# Patient Record
Sex: Male | Born: 1965 | Race: Black or African American | Hispanic: No | Marital: Single | Smoking: Current every day smoker
Health system: Southern US, Community
[De-identification: ages and names within clinical notes are randomized; demographics above are authoritative.]

## PROBLEM LIST (undated history)

## (undated) DIAGNOSIS — F329 Major depressive disorder, single episode, unspecified: Secondary | ICD-10-CM

## (undated) DIAGNOSIS — R45851 Suicidal ideations: Secondary | ICD-10-CM

## (undated) DIAGNOSIS — C189 Malignant neoplasm of colon, unspecified: Secondary | ICD-10-CM

## (undated) DIAGNOSIS — K9400 Colostomy complication, unspecified: Secondary | ICD-10-CM

## (undated) DIAGNOSIS — F32A Depression, unspecified: Secondary | ICD-10-CM

---

## 2013-09-14 DIAGNOSIS — C189 Malignant neoplasm of colon, unspecified: Secondary | ICD-10-CM

## 2013-09-14 HISTORY — DX: Malignant neoplasm of colon, unspecified: C18.9

## 2014-06-14 HISTORY — PX: PARTIAL COLECTOMY: SHX5273

## 2015-04-10 ENCOUNTER — Emergency Department (HOSPITAL_COMMUNITY)
Admission: EM | Admit: 2015-04-10 | Discharge: 2015-04-10 | Disposition: A | Discharge status: Home / Self Care | Payer: Medicaid Other | Attending: Emergency Medicine | Admitting: Emergency Medicine

## 2015-04-10 ENCOUNTER — Encounter (HOSPITAL_COMMUNITY): Payer: Self-pay | Admitting: Family Medicine

## 2015-04-10 ENCOUNTER — Emergency Department (HOSPITAL_COMMUNITY): Payer: Medicaid Other

## 2015-04-10 DIAGNOSIS — I471 Supraventricular tachycardia: Secondary | ICD-10-CM | POA: Insufficient documentation

## 2015-04-10 DIAGNOSIS — C189 Malignant neoplasm of colon, unspecified: Secondary | ICD-10-CM

## 2015-04-10 DIAGNOSIS — R079 Chest pain, unspecified: Secondary | ICD-10-CM | POA: Diagnosis present

## 2015-04-10 DIAGNOSIS — Z720 Tobacco use: Secondary | ICD-10-CM

## 2015-04-10 HISTORY — DX: Malignant neoplasm of colon, unspecified: C18.9

## 2015-04-10 LAB — CBC WITH DIFFERENTIAL/PLATELET
BASOS PCT: 0 % (ref 0–1)
Basophils Absolute: 0 10*3/uL (ref 0.0–0.1)
Eosinophils Absolute: 0.1 10*3/uL (ref 0.0–0.7)
Eosinophils Relative: 1 % (ref 0–5)
HEMATOCRIT: 37.1 % — AB (ref 39.0–52.0)
HEMOGLOBIN: 12.6 g/dL — AB (ref 13.0–17.0)
LYMPHS PCT: 11 % — AB (ref 12–46)
Lymphs Abs: 0.5 10*3/uL — ABNORMAL LOW (ref 0.7–4.0)
MCH: 29.9 pg (ref 26.0–34.0)
MCHC: 34 g/dL (ref 30.0–36.0)
MCV: 87.9 fL (ref 78.0–100.0)
MONO ABS: 0.4 10*3/uL (ref 0.1–1.0)
Monocytes Relative: 8 % (ref 3–12)
Neutro Abs: 3.8 10*3/uL (ref 1.7–7.7)
Neutrophils Relative %: 80 % — ABNORMAL HIGH (ref 43–77)
PLATELETS: 259 10*3/uL (ref 150–400)
RBC: 4.22 MIL/uL (ref 4.22–5.81)
RDW: 15.1 % (ref 11.5–15.5)
WBC: 4.8 10*3/uL (ref 4.0–10.5)

## 2015-04-10 LAB — BASIC METABOLIC PANEL
ANION GAP: 5 (ref 5–15)
BUN: 21 mg/dL — ABNORMAL HIGH (ref 6–20)
CHLORIDE: 112 mmol/L — AB (ref 101–111)
CO2: 23 mmol/L (ref 22–32)
Calcium: 8.7 mg/dL — ABNORMAL LOW (ref 8.9–10.3)
Creatinine, Ser: 1.31 mg/dL — ABNORMAL HIGH (ref 0.61–1.24)
GLUCOSE: 115 mg/dL — AB (ref 65–99)
Potassium: 4 mmol/L (ref 3.5–5.1)
SODIUM: 140 mmol/L (ref 135–145)

## 2015-04-10 LAB — I-STAT CHEM 8, ED
BUN: 23 mg/dL — AB (ref 6–20)
CREATININE: 2 mg/dL — AB (ref 0.61–1.24)
Calcium, Ion: 1.21 mmol/L (ref 1.12–1.23)
Chloride: 110 mmol/L (ref 101–111)
GLUCOSE: 175 mg/dL — AB (ref 65–99)
HCT: 42 % (ref 39.0–52.0)
Hemoglobin: 14.3 g/dL (ref 13.0–17.0)
Potassium: 3.6 mmol/L (ref 3.5–5.1)
Sodium: 145 mmol/L (ref 135–145)
TCO2: 18 mmol/L (ref 0–100)

## 2015-04-10 LAB — I-STAT TROPONIN, ED
TROPONIN I, POC: 0.12 ng/mL — AB (ref 0.00–0.08)
Troponin i, poc: 0.01 ng/mL (ref 0.00–0.08)

## 2015-04-10 LAB — TSH: TSH: 1.492 u[IU]/mL (ref 0.350–4.500)

## 2015-04-10 MED ORDER — ADENOSINE 6 MG/2ML IV SOLN
INTRAVENOUS | Status: AC
Start: 2015-04-10 — End: 2015-04-10
  Administered 2015-04-10: 6 mg
  Filled 2015-04-10: qty 6

## 2015-04-10 MED ORDER — SODIUM CHLORIDE 0.9 % IV BOLUS (SEPSIS)
1000.0000 mL | Freq: Once | INTRAVENOUS | Status: AC
  Administered 2015-04-10: 1000 mL via INTRAVENOUS

## 2015-04-10 MED ORDER — FENTANYL CITRATE (PF) 100 MCG/2ML IJ SOLN
INTRAMUSCULAR | Status: AC
  Administered 2015-04-10: 50 ug
  Filled 2015-04-10: qty 2

## 2015-04-10 MED ORDER — DILTIAZEM HCL 25 MG/5ML IV SOLN
20.0000 mg | Freq: Once | INTRAVENOUS | Status: AC
  Administered 2015-04-10: 20 mg via INTRAVENOUS

## 2015-04-10 MED ORDER — PROPRANOLOL HCL 10 MG PO TABS: ORAL_TABLET | ORAL | Status: DC

## 2015-04-10 MED ORDER — PROPOFOL 10 MG/ML IV BOLUS
INTRAVENOUS | Status: AC
  Filled 2015-04-10: qty 20

## 2015-04-10 NOTE — Discharge Instructions (Signed)
Take prescribed medication as directed. Follow up with the recommended Cardiologist for further evaluation. Return to the ED with worsening or concerning symptoms.

## 2015-04-10 NOTE — ED Notes (Signed)
Pt entered room 7 @ 9:30 am with HR @ 225, pt was extremely diaphoretic and reporting chest pain, appeared very short of breath; pt is alert and oriented, speaking in clear sentences, VSS otherwise stable beside hr. Placed on zoll monitor with pads. MD Reather Converse was at bedside attempting vagal maneuvers which weren't responding so then ordered 6 mg of adenosine; NS Bolus was hanging with IV access in LFA, 20 G. 6 mg of adenosine pushed followed with 20 cc NS push, pt HR did not respond. Zavtiz ordered 12 mg adenosine to follow, again pushed and followed with 20 cc NS push, pt again did not respond. At that time, MD Reather Converse ordered a 20 mg bolus of cardizem STAT and 50 mcg of fentanyl. Both were given. Pt spontaneously converted at that time to NSR @ 80 bpm with Zavitz MD remaining at bedside. Follow up EKG captured. Pt reports relief of chest pain and no longer diaphoretic and sob, VSS - 100/69, HR 87 NSR, SpO2 99%. Per MD Reather Converse do not give cardizem infusion since pt has converted to normal rhythm.
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
## 2015-04-10 NOTE — ED Provider Notes (Signed)
Assumed care from [facility] at shift change.  Briefly, 49 y.o. M here with episode of SVT.  He was given adenosine 6mg  followed by 12mg  without conversion.  He eventually did convert to NSR with IVF and diltiazem.  Troponin did bump to 0.12.  Plan:  Cardiology to evaluate patient and determine disposition.  Results for orders placed or performed during the hospital encounter of 04/10/15  TSH  Result Value Ref Range   TSH 1.492 0.350 - 4.500 uIU/mL  CBC with Differential/Platelet  Result Value Ref Range   WBC 4.8 4.0 - 10.5 K/uL   RBC 4.22 4.22 - 5.81 MIL/uL   Hemoglobin 12.6 (L) 13.0 - 17.0 g/dL   HCT 37.1 (L) 39.0 - 52.0 %   MCV 87.9 78.0 - 100.0 fL   MCH 29.9 26.0 - 34.0 pg   MCHC 34.0 30.0 - 36.0 g/dL   RDW 15.1 11.5 - 15.5 %   Platelets 259 150 - 400 K/uL   Neutrophils Relative % 80 (H) 43 - 77 %   Neutro Abs 3.8 1.7 - 7.7 K/uL   Lymphocytes Relative 11 (L) 12 - 46 %   Lymphs Abs 0.5 (L) 0.7 - 4.0 K/uL   Monocytes Relative 8 3 - 12 %   Monocytes Absolute 0.4 0.1 - 1.0 K/uL   Eosinophils Relative 1 0 - 5 %   Eosinophils Absolute 0.1 0.0 - 0.7 K/uL   Basophils Relative 0 0 - 1 %   Basophils Absolute 0.0 0.0 - 0.1 K/uL  Basic metabolic panel  Result Value Ref Range   Sodium 140 135 - 145 mmol/L   Potassium 4.0 3.5 - 5.1 mmol/L   Chloride 112 (H) 101 - 111 mmol/L   CO2 23 22 - 32 mmol/L   Glucose, Bld 115 (H) 65 - 99 mg/dL   BUN 21 (H) 6 - 20 mg/dL   Creatinine, Ser 1.31 (H) 0.61 - 1.24 mg/dL   Calcium 8.7 (L) 8.9 - 10.3 mg/dL   GFR calc non Af Amer >60 >60 mL/min   GFR calc Af Amer >60 >60 mL/min   Anion gap 5 5 - 15  I-stat troponin, ED  Result Value Ref Range   Troponin i, poc 0.01 0.00 - 0.08 ng/mL   Comment 3          I-stat chem 8, ed  Result Value Ref Range   Sodium 145 135 - 145 mmol/L   Potassium 3.6 3.5 - 5.1 mmol/L   Chloride 110 101 - 111 mmol/L   BUN 23 (H) 6 - 20 mg/dL   Creatinine, Ser 2.00 (H) 0.61 - 1.24 mg/dL   Glucose, Bld 175 (H) 65 - 99  mg/dL   Calcium, Ion 1.21 1.12 - 1.23 mmol/L   TCO2 18 0 - 100 mmol/L   Hemoglobin 14.3 13.0 - 17.0 g/dL   HCT 42.0 39.0 - 52.0 %  I-stat troponin, ED  Result Value Ref Range   Troponin i, poc 0.12 (HH) 0.00 - 0.08 ng/mL   Comment 3           Dg Chest Portable 1 View  04/10/2015   CLINICAL DATA:  Shortness of breath and chest pain  EXAM: PORTABLE CHEST - 1 VIEW  COMPARISON:  None.  FINDINGS: Cardiac shadow is mildly enlarged. A left chest wall port is noted in the proximal superior vena cava. The lungs are free of acute infiltrate or sizable effusion. No bony abnormality is noted.  IMPRESSION: No acute abnormality seen.  Electronically Signed   By: Inez Catalina M.D.   On: 04/10/2015 10:16    Patient has been evaluated by cardiology. He remains in sinus rhythm. He does have a few noted PVCs and PACs, asymptomatic of this. Dr. Acie Fredrickson feels patient is stable for discharge with outpatient follow-up. He is to be started on propranolol 10mg  Q10 mins x 3 doses for recurrent symptoms.  He will FU in office.  Discussed plan with patient, he/she acknowledged understanding and agreed with plan of care.  Return precautions given for new or worsening symptoms.  Larene Pickett, PA-C 04/10/15 2001  Malvin Johns, MD 04/10/15 2116

## 2015-04-10 NOTE — ED Notes (Signed)
Pt here for chest pain that started 20 minutes ago. Pt diaphoretic and SOB.

## 2015-04-10 NOTE — ED Notes (Signed)
Pt brought back to room via wheelchair; pt undressed, placed on monitor, continuous pulse oximetry, blood pressure cuff and zoll pads placed; Reather Converse, MD and [place], [place] met myself and Tressia Miners, RN in room along with [place], EMT

## 2015-04-10 NOTE — ED Provider Notes (Signed)
CSN: [id number]     Arrival date & time 04/10/15  0906 History   First MD Initiated Contact with Patient 04/10/15 0915     Chief Complaint  Patient presents with  . Chest Pain     (Consider location/radiation/quality/duration/timing/severity/associated sxs/prior Treatment) Patient is a 49 y.o. male presenting with chest pain. The history is provided by the patient.  Chest Pain Pain location:  L chest Pain quality: aching   Pain radiates to:  Does not radiate Pain radiates to the back: no   Pain severity:  Severe Onset quality:  Sudden Duration:  20 minutes Timing:  Constant Progression:  Unchanged Chronicity:  New Context: at rest   Context: not breathing, no drug use, not eating, no intercourse, not lifting, no movement, not raising an arm, no stress and no trauma   Relieved by:  Nothing Worsened by:  Nothing tried Ineffective treatments:  None tried Associated symptoms: diaphoresis and shortness of breath   Risk factors: male sex and obesity   Risk factors: no aortic disease, no birth control, no coronary artery disease, no diabetes mellitus, no Ehlers-Danlos syndrome, no high cholesterol, no immobilization, not pregnant, no prior DVT/PE, no smoking and no surgery     History reviewed. No pertinent past medical history. History reviewed. No pertinent past surgical history. No family history on file. History  Substance Use Topics  . Smoking status: Not on file  . Smokeless tobacco: Not on file  . Alcohol Use: Not on file    Review of Systems  Constitutional: Positive for diaphoresis.  Respiratory: Positive for shortness of breath.   Cardiovascular: Positive for chest pain.  All other systems reviewed and are negative.     Allergies  Morphine and related  Home Medications   Prior to Admission medications   Not on File   BP 100/69 mmHg  Pulse 75  Resp 13  SpO2 99% Physical Exam  Constitutional: He is oriented to person, place, and time. He appears  well-developed and well-nourished. He appears distressed.  HENT:  Head: Normocephalic and atraumatic.  Eyes: Conjunctivae are normal.  Neck: Normal range of motion. Neck supple.  Cardiovascular: Normal rate and regular rhythm.  Exam reveals no gallop and no friction rub.   No murmur heard. Pulmonary/Chest: Breath sounds normal. He has no wheezes. He has no rales. He exhibits no tenderness.  Increased breathing effort.   Abdominal: Soft. He exhibits no distension. There is no tenderness. There is no rebound.  Ostomy of LLQ. No tenderness to palpation.   Musculoskeletal: Normal range of motion.  Neurological: He is alert and oriented to person, place, and time. Coordination normal.  Speech is goal-oriented. Moves limbs without ataxia.   Skin: Skin is warm. He is diaphoretic.  Psychiatric: He has a normal mood and affect. His behavior is normal.  Nursing note and vitals reviewed.   ED Course  Procedures (including critical care time)  CRITICAL CARE Performed by: Alvina Chou   Total critical care time: 1 hour  Critical care time was exclusive of separately billable procedures and treating other patients.  Critical care was necessary to treat or prevent imminent or life-threatening deterioration.  Critical care was time spent personally by me on the following activities: development of treatment plan with patient and/or surrogate as well as nursing, discussions with consultants, evaluation of patient's response to treatment, examination of patient, obtaining history from patient or surrogate, ordering and performing treatments and interventions, ordering and review of laboratory studies, ordering and review of radiographic  studies, pulse oximetry and re-evaluation of patient's condition.   Labs Review Labs Reviewed  CBC WITH DIFFERENTIAL/PLATELET - Abnormal; Notable for the following:    Hemoglobin 12.6 (*)    HCT 37.1 (*)    Neutrophils Relative % 80 (*)    Lymphocytes  Relative 11 (*)    Lymphs Abs 0.5 (*)    All other components within normal limits  BASIC METABOLIC PANEL - Abnormal; Notable for the following:    Chloride 112 (*)    Glucose, Bld 115 (*)    BUN 21 (*)    Creatinine, Ser 1.31 (*)    Calcium 8.7 (*)    All other components within normal limits  I-STAT CHEM 8, ED - Abnormal; Notable for the following:    BUN 23 (*)    Creatinine, Ser 2.00 (*)    Glucose, Bld 175 (*)    All other components within normal limits  I-STAT TROPOININ, ED - Abnormal; Notable for the following:    Troponin i, poc 0.12 (*)    All other components within normal limits  TSH  I-STAT TROPOININ, ED    Imaging Review Dg Chest Portable 1 View  04/10/2015   CLINICAL DATA:  Shortness of breath and chest pain  EXAM: PORTABLE CHEST - 1 VIEW  COMPARISON:  None.  FINDINGS: Cardiac shadow is mildly enlarged. A left chest wall port is noted in the proximal superior vena cava. The lungs are free of acute infiltrate or sizable effusion. No bony abnormality is noted.  IMPRESSION: No acute abnormality seen.   Electronically Signed   By: Inez Catalina M.D.   On: 04/10/2015 10:16     EKG Interpretation   Date/Time:  Wednesday April 10 2015 09:12:27 EDT Ventricular Rate:  224 PR Interval:    QRS Duration: 96 QT Interval:  214 QTC Calculation: 413 R Axis:   97 Text Interpretation:  Supraventricular tachycardia Rightward axis RSR' or  QR pattern in V1 suggests right ventricular conduction delay Marked ST  abnormality, possible inferolateral subendocardial injury Abnormal ECG  Confirmed by ZAVITZ  MD, JOSHUA ([id number]) on 04/10/2015 9:39:14 AM      MDM   Final diagnoses:  SVT (supraventricular tachycardia)    10:11 AM Patient presented diaphoretic and complaining of left side chest pain. He was also short of breath. EKG found to have SVT. Vagal maneuvers attempted without success. Patient given 6mg  adenosine without success, followed by 12mg  adenosine. Patient maintained  consciousness. Patient also subsequently started with fluids and diltiazem IV. Patient converted to NSR after these interventions. Labs pending. Vitals stable and patient afebrile.   Patient feeling better after conversion. Delta troponin shows mild elevated. Cardiology will see the patient. Patient signed out to Quincy Carnes, PA-C.    Alvina Chou, PA-C 04/11/15 2446  Elnora Morrison, MD 04/13/15 [phone]

## 2015-04-10 NOTE — ED Notes (Signed)
Cardiology at bedside.

## 2015-04-10 NOTE — ED Notes (Signed)
Lab result of I-Stat troponin 0.12 reported to [place].

## 2015-04-10 NOTE — ED Notes (Signed)
Spoke with lab, they will run CBC and BMP that was sent down at 0930 this morning.

## 2015-05-21 ENCOUNTER — Telehealth: Payer: Self-pay | Admitting: *Deleted

## 2015-05-21 NOTE — Telephone Encounter (Signed)
called for family medical hx, no answer

## 2015-05-23 ENCOUNTER — Ambulatory Visit: Payer: Medicaid Other | Admitting: Cardiovascular Disease

## 2015-05-29 ENCOUNTER — Encounter: Payer: Self-pay | Admitting: Cardiovascular Disease

## 2015-06-11 ENCOUNTER — Emergency Department (HOSPITAL_COMMUNITY)
Admission: EM | Admit: 2015-06-11 | Discharge: 2015-06-12 | Disposition: A | Discharge status: Home / Self Care | Payer: Medicaid Other | Source: Home / Self Care | Attending: Emergency Medicine | Admitting: Emergency Medicine

## 2015-06-11 ENCOUNTER — Emergency Department (HOSPITAL_COMMUNITY)
Admission: EM | Admit: 2015-06-11 | Discharge: 2015-06-11 | Disposition: A | Discharge status: Home / Self Care | Payer: Medicaid Other | Attending: Emergency Medicine | Admitting: Emergency Medicine

## 2015-06-11 ENCOUNTER — Emergency Department (HOSPITAL_COMMUNITY): Payer: Medicaid Other

## 2015-06-11 ENCOUNTER — Encounter (HOSPITAL_COMMUNITY): Payer: Self-pay | Admitting: Radiology

## 2015-06-11 ENCOUNTER — Encounter (HOSPITAL_COMMUNITY): Payer: Self-pay | Admitting: Emergency Medicine

## 2015-06-11 DIAGNOSIS — R51 Headache: Secondary | ICD-10-CM | POA: Insufficient documentation

## 2015-06-11 DIAGNOSIS — Z85038 Personal history of other malignant neoplasm of large intestine: Secondary | ICD-10-CM | POA: Diagnosis not present

## 2015-06-11 DIAGNOSIS — R519 Headache, unspecified: Secondary | ICD-10-CM

## 2015-06-11 DIAGNOSIS — R55 Syncope and collapse: Secondary | ICD-10-CM | POA: Diagnosis present

## 2015-06-11 DIAGNOSIS — R079 Chest pain, unspecified: Secondary | ICD-10-CM | POA: Insufficient documentation

## 2015-06-11 LAB — I-STAT TROPONIN, ED
TROPONIN I, POC: 0 ng/mL (ref 0.00–0.08)
TROPONIN I, POC: 0 ng/mL (ref 0.00–0.08)

## 2015-06-11 LAB — BASIC METABOLIC PANEL
ANION GAP: 8 (ref 5–15)
Anion gap: 8 (ref 5–15)
BUN: 10 mg/dL (ref 6–20)
BUN: 13 mg/dL (ref 6–20)
CALCIUM: 9 mg/dL (ref 8.9–10.3)
CO2: 28 mmol/L (ref 22–32)
CO2: 24 mmol/L (ref 22–32)
Calcium: 9.5 mg/dL (ref 8.9–10.3)
Chloride: 102 mmol/L (ref 101–111)
Chloride: 104 mmol/L (ref 101–111)
Creatinine, Ser: 1.47 mg/dL — ABNORMAL HIGH (ref 0.61–1.24)
Creatinine, Ser: 1.4 mg/dL — ABNORMAL HIGH (ref 0.61–1.24)
GFR calc Af Amer: >60 mL/min (ref >60)
GFR calc Af Amer: >60 mL/min (ref >60)
GFR calc non Af Amer: 55 mL/min — ABNORMAL LOW (ref >60)
GFR, EST NON AFRICAN AMERICAN: 58 mL/min — AB (ref >60)
GLUCOSE: 130 mg/dL — AB (ref 65–99)
Glucose, Bld: 81 mg/dL (ref 65–99)
Potassium: 3.9 mmol/L (ref 3.5–5.1)
Potassium: 3.6 mmol/L (ref 3.5–5.1)
Sodium: 138 mmol/L (ref 135–145)
Sodium: 136 mmol/L (ref 135–145)

## 2015-06-11 LAB — CBC
HCT: 37.2 % — ABNORMAL LOW (ref 39.0–52.0)
HEMOGLOBIN: 12.5 g/dL — AB (ref 13.0–17.0)
MCH: 29.5 pg (ref 26.0–34.0)
MCHC: 33.6 g/dL (ref 30.0–36.0)
MCV: 87.7 fL (ref 78.0–100.0)
Platelets: 269 10*3/uL (ref 150–400)
RBC: 4.24 MIL/uL (ref 4.22–5.81)
RDW: 15.1 % (ref 11.5–15.5)
WBC: 9.8 10*3/uL (ref 4.0–10.5)

## 2015-06-11 LAB — CBC WITH DIFFERENTIAL/PLATELET
Basophils Absolute: 0 10*3/uL (ref 0.0–0.1)
Basophils Relative: 0 %
Eosinophils Absolute: 0.1 10*3/uL (ref 0.0–0.7)
Eosinophils Relative: 1 %
HCT: 39.7 % (ref 39.0–52.0)
Hemoglobin: 13.1 g/dL (ref 13.0–17.0)
Lymphocytes Relative: 8 %
Lymphs Abs: 1 10*3/uL (ref 0.7–4.0)
MCH: 29.4 pg (ref 26.0–34.0)
MCHC: 33 g/dL (ref 30.0–36.0)
MCV: 89.2 fL (ref 78.0–100.0)
Monocytes Absolute: 0.5 10*3/uL (ref 0.1–1.0)
Monocytes Relative: 4 %
Neutro Abs: 11.3 10*3/uL — ABNORMAL HIGH (ref 1.7–7.7)
Neutrophils Relative %: 87 %
Platelets: 275 10*3/uL (ref 150–400)
RBC: 4.45 MIL/uL (ref 4.22–5.81)
RDW: 15.4 % (ref 11.5–15.5)
WBC: 12.9 10*3/uL — ABNORMAL HIGH (ref 4.0–10.5)

## 2015-06-11 LAB — I-STAT CHEM 8, ED
BUN: 14 mg/dL (ref 6–20)
CALCIUM ION: 1.19 mmol/L (ref 1.12–1.23)
Chloride: 105 mmol/L (ref 101–111)
Creatinine, Ser: 1.3 mg/dL — ABNORMAL HIGH (ref 0.61–1.24)
GLUCOSE: 127 mg/dL — AB (ref 65–99)
HCT: 41 % (ref 39.0–52.0)
HEMOGLOBIN: 13.9 g/dL (ref 13.0–17.0)
Potassium: 3.6 mmol/L (ref 3.5–5.1)
SODIUM: 141 mmol/L (ref 135–145)
TCO2: 22 mmol/L (ref 0–100)

## 2015-06-11 LAB — URINALYSIS, ROUTINE W REFLEX MICROSCOPIC
Bilirubin Urine: NEGATIVE
Glucose, UA: NEGATIVE mg/dL
Hgb urine dipstick: NEGATIVE
Ketones, ur: NEGATIVE mg/dL
Leukocytes, UA: NEGATIVE
Nitrite: NEGATIVE
Protein, ur: NEGATIVE mg/dL
Specific Gravity, Urine: 1.014 (ref 1.005–1.030)
Urobilinogen, UA: 0.2 mg/dL (ref 0.0–1.0)
pH: 6 (ref 5.0–8.0)

## 2015-06-11 MED ORDER — DIPHENHYDRAMINE HCL 50 MG/ML IJ SOLN
25.0000 mg | Freq: Once | INTRAMUSCULAR | Status: DC
  Filled 2015-06-11: qty 1

## 2015-06-11 MED ORDER — NITROGLYCERIN 0.4 MG SL SUBL: 0.4000 mg | SUBLINGUAL_TABLET | SUBLINGUAL | Status: DC | PRN

## 2015-06-11 MED ORDER — BUTALBITAL-APAP-CAFFEINE 50-325-40 MG PO TABS: 1.0000 | ORAL_TABLET | Freq: Four times a day (QID) | ORAL | Status: DC | PRN

## 2015-06-11 MED ORDER — DEXAMETHASONE SODIUM PHOSPHATE 10 MG/ML IJ SOLN
10.0000 mg | Freq: Once | INTRAMUSCULAR | Status: AC
  Administered 2015-06-11: 10 mg via INTRAVENOUS
  Filled 2015-06-11: qty 1

## 2015-06-11 MED ORDER — PROCHLORPERAZINE EDISYLATE 5 MG/ML IJ SOLN
10.0000 mg | Freq: Once | INTRAMUSCULAR | Status: AC
  Administered 2015-06-11: 10 mg via INTRAVENOUS
  Filled 2015-06-11: qty 2

## 2015-06-11 MED ORDER — KETOROLAC TROMETHAMINE 30 MG/ML IJ SOLN
15.0000 mg | Freq: Once | INTRAMUSCULAR | Status: AC
  Administered 2015-06-11: 15 mg via INTRAVENOUS
  Filled 2015-06-11: qty 1

## 2015-06-11 MED ORDER — IOHEXOL 350 MG/ML SOLN
100.0000 mL | Freq: Once | INTRAVENOUS | Status: AC | PRN
  Administered 2015-06-11: 100 mL via INTRAVENOUS

## 2015-06-11 MED ORDER — ASPIRIN 81 MG PO CHEW: 324.0000 mg | CHEWABLE_TABLET | Freq: Once | ORAL | Status: DC

## 2015-06-11 MED ORDER — SODIUM CHLORIDE 0.9 % IV BOLUS (SEPSIS)
1000.0000 mL | Freq: Once | INTRAVENOUS | Status: AC
  Administered 2015-06-11: 1000 mL via INTRAVENOUS

## 2015-06-11 MED ORDER — DIPHENHYDRAMINE HCL 50 MG/ML IJ SOLN
25.0000 mg | Freq: Once | INTRAMUSCULAR | Status: AC
  Administered 2015-06-11: 25 mg via INTRAVENOUS
  Filled 2015-06-11: qty 1

## 2015-06-11 MED ORDER — KETOROLAC TROMETHAMINE 30 MG/ML IJ SOLN: 30.0000 mg | Freq: Once | INTRAMUSCULAR | Status: DC

## 2015-06-11 NOTE — ED Notes (Signed)
Pt's O2 level continues to drop below 92% for short intervals as he sleeps,  Placed 2 L O2 [place] on pt.

## 2015-06-11 NOTE — Discharge Instructions (Signed)
Return here as needed.  Follow-up with the clinic provided [place] stopped by the clinic to set up an appointment

## 2015-06-11 NOTE — ED Notes (Signed)
Per [facility] pt was discharged from ED around 5pm after being treated for a headache.  While he was waiting at the bus stop he began to have "pass out episodes" and then woke up w/ chest pain.  He was siting and did not fall or hit his head.  Does have a history of cancer and a colostomy.  Reports left sided chest pain that radiates down his left arm.  No N/V/D.  Was given 324 ASP in route but no nitro due to headache.
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
## 2015-06-11 NOTE — Discharge Instructions (Signed)
Syncope °Syncope is a medical term for fainting or passing out. This means you lose consciousness and drop to the ground. People are generally unconscious for less than 5 minutes. You may have some muscle twitches for up to 15 seconds before waking up and returning to normal. Syncope occurs more often in older adults, but it can happen to anyone. While most causes of syncope are not dangerous, syncope can be a sign of a serious medical problem. It is important to seek medical care.  °CAUSES  °Syncope is caused by a sudden drop in blood flow to the brain. The specific cause is often not determined. Factors that can bring on syncope include: °· Taking medicines that lower blood pressure. °· Sudden changes in posture, such as standing up quickly. °· Taking more medicine than prescribed. °· Standing in one place for too long. °· Seizure disorders. °· Dehydration and excessive exposure to heat. °· Low blood sugar (hypoglycemia). °· Straining to have a bowel movement. °· Heart disease, irregular heartbeat, or other circulatory problems. °· Fear, emotional distress, seeing blood, or severe pain. °SYMPTOMS  °Right before fainting, you may: °· Feel dizzy or light-headed. °· Feel nauseous. °· See all white or all black in your field of vision. °· Have cold, clammy skin. °DIAGNOSIS  °Your health care provider will ask about your symptoms, perform a physical exam, and perform an electrocardiogram (ECG) to record the electrical activity of your heart. Your health care provider may also perform other heart or blood tests to determine the cause of your syncope which may include: °· Transthoracic echocardiogram (TTE). During echocardiography, sound waves are used to evaluate how blood flows through your heart. °· Transesophageal echocardiogram (TEE). °· Cardiac monitoring. This allows your health care provider to monitor your heart rate and rhythm in real time. °· Holter monitor. This is a portable device that records your  heartbeat and can help diagnose heart arrhythmias. It allows your health care provider to track your heart activity for several days, if needed. °· Stress tests by exercise or by giving medicine that makes the heart beat faster. °TREATMENT  °In most cases, no treatment is needed. Depending on the cause of your syncope, your health care provider may recommend changing or stopping some of your medicines. °HOME CARE INSTRUCTIONS °· Have someone stay with you until you feel stable. °· Do not drive, use machinery, or play sports until your health care provider says it is okay. °· Keep all follow-up appointments as directed by your health care provider. °· Lie down right away if you start feeling like you might faint. Breathe deeply and steadily. Wait until all the symptoms have passed. °· Drink enough fluids to keep your urine clear or pale yellow. °· If you are taking blood pressure or heart medicine, get up slowly and take several minutes to sit and then stand. This can reduce dizziness. °SEEK IMMEDIATE MEDICAL CARE IF:  °· You have a severe headache. °· You have unusual pain in the chest, abdomen, or back. °· You are bleeding from your mouth or rectum, or you have black or tarry stool. °· You have an irregular or very fast heartbeat. °· You have pain with breathing. °· You have repeated fainting or seizure-like jerking during an episode. °· You faint when sitting or lying down. °· You have confusion. °· You have trouble walking. °· You have severe weakness. °· You have vision problems. °If you fainted, call your local emergency services (911 in [place] Do not drive   yourself to the hospital.  °MAKE SURE YOU: °· Understand these instructions. °· Will watch your condition. °· Will get help right away if you are not doing well or get worse. °Document Released: 08/31/2005 Document Revised: 09/05/2013 Document Reviewed: 10/30/2011 °[employer]® Patient Information ©2015 [employer]. This information is not intended to replace  advice given to you by your health care provider. Make sure you discuss any questions you have with your health care provider. ° °

## 2015-06-11 NOTE — ED Notes (Signed)
Patient transported to X-ray 

## 2015-06-11 NOTE — ED Notes (Signed)
Pt sts generalized HA x 2 days with some blotches in vision per pt

## 2015-06-11 NOTE — ED Provider Notes (Signed)
CSN: [id number]     Arrival date & time 06/11/15  1204 History   First MD Initiated Contact with Patient 06/11/15 1332     Chief Complaint  Patient presents with  . Headache     (Consider location/radiation/quality/duration/timing/severity/associated sxs/prior Treatment) HPI Patient presents to the emergency department with headache that started 2 days ago.  The patient states he has generalized headache is also had some blurred vision and light sensitivity.  Patient states that he has had episodes similar to this in the past.  He is also had episodes where he has had near syncope or syncope from the headache.  Patient states that he had recent colon cancer surgery last year.  Patient states he is not having chest pain, shortness breath, nausea, vomiting, weakness, dizziness, neck pain, neck pain, fever, cough, dysuria, incontinence, hematemesis, bloody stool, abdominal pain, or rash.  The patient states that he took Tylenol PM last night Past Medical History  Diagnosis Date  . Colon cancer   . Colon cancer 2015   Past Surgical History  Procedure Laterality Date  . Partial colectomy  06/2014    done at [facility]   Family History  Problem Relation Age of Onset  . Coronary artery disease Cousin 9   Social History  Substance Use Topics  . Smoking status: None  . Smokeless tobacco: None  . Alcohol Use: None    Review of Systems  All other systems negative except as documented in the HPI. All pertinent positives and negatives as reviewed in the HPI.  Allergies  Morphine and related  Home Medications   Prior to Admission medications   Medication Sig Start Date End Date Taking? Authorizing Provider  ibuprofen (ADVIL,MOTRIN) 200 MG tablet Take 200 mg by mouth every 6 (six) hours as needed for fever.    Historical Provider, MD  propranolol (INDERAL) 10 MG tablet Take 1 tablet Q10 mins x3 doses for palpitations/SVT 04/10/15   Larene Pickett, PA-C   BP 129/90 mmHg  Pulse 73   Temp(Src) 98.3 F (36.8 C) (Oral)  Resp 19  SpO2 97% Physical Exam  Constitutional: He is oriented to person, place, and time. He appears well-developed and well-nourished. No distress.  HENT:  Head: Normocephalic and atraumatic.  Eyes: Pupils are equal, round, and reactive to light.  Neck: Normal range of motion. Neck supple.  Cardiovascular: Normal rate, regular rhythm and normal heart sounds.  Exam reveals no gallop and no friction rub.   No murmur heard. Pulmonary/Chest: Effort normal and breath sounds normal. No respiratory distress. He has no wheezes.  Musculoskeletal: He exhibits no edema.  Neurological: He is alert and oriented to person, place, and time. He has normal reflexes. No cranial nerve deficit. He exhibits normal muscle tone. Coordination normal.  Skin: Skin is warm and dry. No rash noted. No erythema.  Psychiatric: He has a normal mood and affect. His behavior is normal.  Nursing note and vitals reviewed.   ED Course  Procedures (including critical care time) Labs Review Labs Reviewed  BASIC METABOLIC PANEL - Abnormal; Notable for the following:    Creatinine, Ser 1.47 (*)    GFR calc non Af Amer 55 (*)    All other components within normal limits  CBC WITH DIFFERENTIAL/PLATELET - Abnormal; Notable for the following:    WBC 12.9 (*)    Neutro Abs 11.3 (*)    All other components within normal limits  URINALYSIS, ROUTINE W REFLEX MICROSCOPIC (NOT AT [facility])    Imaging  Review Ct Head Wo Contrast  06/11/2015   CLINICAL DATA:  Worsening headache for 2 days. Altered vision. No known injury. Initial encounter.  EXAM: CT HEAD WITHOUT CONTRAST  TECHNIQUE: Contiguous axial images were obtained from the base of the skull through the vertex without intravenous contrast.  COMPARISON:  None.  FINDINGS: The brain appears normal without hemorrhage, infarct, mass lesion, mass effect, midline shift or abnormal extra-axial fluid collection. No hydrocephalus or pneumocephalus. The  calvarium is intact. Imaged paranasal sinuses demonstrate mucosal thickening in the left maxillary.  IMPRESSION: No acute intracranial abnormality.  Mucosal thickening left maxillary sinus.   Electronically Signed   By: Inge Rise M.D.   On: 06/11/2015 14:57   I have personally reviewed and evaluated these images and lab results as part of my medical decision-making.   EKG Interpretation   Date/Time:  Tuesday June 11 2015 14:08:47 EDT Ventricular Rate:  72 PR Interval:  139 QRS Duration: 109 QT Interval:  432 QTC Calculation: 473 R Axis:   36 Text Interpretation:  Sinus rhythm ST elev, probable normal early repol  pattern No significant change since last tracing Confirmed by YAO  MD,  DAVID ([id number]) on 06/11/2015 2:24:15 PM       patient has a negative head CT scan other than maxillary sinus thickening.  Patient is given IV fluids and migraine type treatment is having migraine-like headache.  Based the fact that he has light sensitivity and significant headache.  Patient has had no fever or signs of sepsis    Dalia Heading, PA-C 06/11/15 Congress Yao, MD 06/13/15 [phone]

## 2015-07-31 ENCOUNTER — Emergency Department (HOSPITAL_COMMUNITY): Payer: Medicaid Other

## 2015-07-31 ENCOUNTER — Encounter (HOSPITAL_COMMUNITY): Payer: Self-pay | Admitting: Emergency Medicine

## 2015-07-31 ENCOUNTER — Emergency Department (HOSPITAL_COMMUNITY)
Admission: EM | Admit: 2015-07-31 | Discharge: 2015-07-31 | Disposition: A | Discharge status: Home / Self Care | Payer: Medicaid Other | Attending: Emergency Medicine | Admitting: Emergency Medicine

## 2015-07-31 DIAGNOSIS — R079 Chest pain, unspecified: Secondary | ICD-10-CM | POA: Diagnosis not present

## 2015-07-31 DIAGNOSIS — K829 Disease of gallbladder, unspecified: Secondary | ICD-10-CM

## 2015-07-31 DIAGNOSIS — K828 Other specified diseases of gallbladder: Secondary | ICD-10-CM | POA: Insufficient documentation

## 2015-07-31 DIAGNOSIS — Z85038 Personal history of other malignant neoplasm of large intestine: Secondary | ICD-10-CM | POA: Diagnosis not present

## 2015-07-31 DIAGNOSIS — F17200 Nicotine dependence, unspecified, uncomplicated: Secondary | ICD-10-CM | POA: Diagnosis not present

## 2015-07-31 DIAGNOSIS — R1013 Epigastric pain: Secondary | ICD-10-CM | POA: Diagnosis present

## 2015-07-31 LAB — COMPREHENSIVE METABOLIC PANEL
ALT: 24 U/L (ref 17–63)
ANION GAP: 8 (ref 5–15)
AST: 21 U/L (ref 15–41)
Albumin: 4 g/dL (ref 3.5–5.0)
Alkaline Phosphatase: 60 U/L (ref 38–126)
BILIRUBIN TOTAL: 0.3 mg/dL (ref 0.3–1.2)
BUN: 15 mg/dL (ref 6–20)
CALCIUM: 9.5 mg/dL (ref 8.9–10.3)
CO2: 25 mmol/L (ref 22–32)
Chloride: 110 mmol/L (ref 101–111)
Creatinine, Ser: 1.2 mg/dL (ref 0.61–1.24)
GFR calc Af Amer: >60 mL/min (ref >60)
Glucose, Bld: 126 mg/dL — ABNORMAL HIGH (ref 65–99)
POTASSIUM: 4.3 mmol/L (ref 3.5–5.1)
Sodium: 143 mmol/L (ref 135–145)
TOTAL PROTEIN: 7.2 g/dL (ref 6.5–8.1)

## 2015-07-31 LAB — I-STAT TROPONIN, ED: TROPONIN I, POC: 0 ng/mL (ref 0.00–0.08)

## 2015-07-31 LAB — CBC WITH DIFFERENTIAL/PLATELET
BASOS ABS: 0 10*3/uL (ref 0.0–0.1)
BASOS PCT: 0 %
Eosinophils Absolute: 0.1 10*3/uL (ref 0.0–0.7)
Eosinophils Relative: 1 %
HEMATOCRIT: 40 % (ref 39.0–52.0)
Hemoglobin: 13.3 g/dL (ref 13.0–17.0)
Lymphocytes Relative: 20 %
Lymphs Abs: 0.7 10*3/uL (ref 0.7–4.0)
MCH: 29.1 pg (ref 26.0–34.0)
MCHC: 33.3 g/dL (ref 30.0–36.0)
MCV: 87.5 fL (ref 78.0–100.0)
MONO ABS: 0.5 10*3/uL (ref 0.1–1.0)
Monocytes Relative: 13 %
NEUTROS ABS: 2.3 10*3/uL (ref 1.7–7.7)
NEUTROS PCT: 66 %
Platelets: 228 10*3/uL (ref 150–400)
RBC: 4.57 MIL/uL (ref 4.22–5.81)
RDW: 13.6 % (ref 11.5–15.5)
WBC: 3.6 10*3/uL — ABNORMAL LOW (ref 4.0–10.5)

## 2015-07-31 LAB — D-DIMER, QUANTITATIVE (NOT AT ARMC): D DIMER QUANT: 1.41 ug{FEU}/mL — AB (ref 0.00–0.50)

## 2015-07-31 LAB — LIPASE, BLOOD: LIPASE: 43 U/L (ref 11–51)

## 2015-07-31 MED ORDER — FAMOTIDINE 20 MG PO TABS
40.0000 mg | ORAL_TABLET | Freq: Once | ORAL | Status: AC
  Administered 2015-07-31: 40 mg via ORAL
  Filled 2015-07-31: qty 2

## 2015-07-31 MED ORDER — DIPHENHYDRAMINE HCL 50 MG/ML IJ SOLN
25.0000 mg | Freq: Once | INTRAMUSCULAR | Status: AC
  Administered 2015-07-31: 25 mg via INTRAVENOUS
  Filled 2015-07-31: qty 1

## 2015-07-31 MED ORDER — IOHEXOL 350 MG/ML SOLN
100.0000 mL | Freq: Once | INTRAVENOUS | Status: AC | PRN
  Administered 2015-07-31: 65 mL via INTRAVENOUS

## 2015-07-31 MED ORDER — GI COCKTAIL ~~LOC~~
30.0000 mL | Freq: Once | ORAL | Status: AC
  Administered 2015-07-31: 30 mL via ORAL
  Filled 2015-07-31: qty 30

## 2015-07-31 MED ORDER — MORPHINE SULFATE (PF) 4 MG/ML IV SOLN
4.0000 mg | Freq: Once | INTRAVENOUS | Status: AC
  Administered 2015-07-31: 4 mg via INTRAVENOUS
  Filled 2015-07-31: qty 1

## 2015-07-31 MED ORDER — DIPHENHYDRAMINE HCL 12.5 MG/5ML PO ELIX: 25.0000 mg | ORAL_SOLUTION | Freq: Once | ORAL | Status: DC

## 2015-07-31 NOTE — ED Provider Notes (Signed)
CSN: [id number]     Arrival date & time 07/31/15  1504 History   First MD Initiated Contact with Patient 07/31/15 1603     Chief Complaint  Patient presents with  . Chest Pain     (Consider location/radiation/quality/duration/timing/severity/associated sxs/prior Treatment) HPI Comments: Here complaining of constant mid epigastric discomfort that began 4 hours ago at rest. Pain is been persistent and not relieved with taking antacids. No associated diaphoresis or dyspnea. No nausea vomiting. Pain radiates to his back and is not associated with syncope or syncope. No leg pain or swelling. No prior history of same. No exertional component to this. Denies any recent fever or cough.  Patient is a 49 y.o. male presenting with chest pain. The history is provided by the patient.  Chest Pain   Past Medical History  Diagnosis Date  . Colon cancer ([place])   . Colon cancer ([place]) 2015   Past Surgical History  Procedure Laterality Date  . Partial colectomy  06/2014    done at [facility]   Family History  Problem Relation Age of Onset  . Coronary artery disease Cousin 74   Social History  Substance Use Topics  . Smoking status: Current Every Day Smoker  . Smokeless tobacco: None  . Alcohol Use: None    Review of Systems  Cardiovascular: Positive for chest pain.  All other systems reviewed and are negative.     Allergies  Morphine and related  Home Medications   Prior to Admission medications   Medication Sig Start Date End Date Taking? Authorizing Provider  butalbital-acetaminophen-caffeine (FIORICET) 50-325-40 MG tablet Take 1 tablet by mouth every 6 (six) hours as needed for headache. 06/11/15 06/10/16  Dalia Heading, PA-C  ibuprofen (ADVIL,MOTRIN) 200 MG tablet Take 200 mg by mouth every 6 (six) hours as needed for fever.    Historical Provider, MD  propranolol (INDERAL) 10 MG tablet Take 1 tablet Q10 mins x3 doses for palpitations/SVT 04/10/15   Larene Pickett, PA-C   BP  124/100 mmHg  Pulse 94  Temp(Src) 97.7 F (36.5 C) (Oral)  Resp 20  Ht 5\' 8"  (1.727 m)  Wt 247 lb 9 oz (112.294 kg)  BMI 37.65 kg/m2  SpO2 98% Physical Exam  Constitutional: He is oriented to person, place, and time. He appears well-developed and well-nourished.  Non-toxic appearance. No distress.  HENT:  Head: Normocephalic and atraumatic.  Eyes: Conjunctivae, EOM and lids are normal. Pupils are equal, round, and reactive to light.  Neck: Normal range of motion. Neck supple. No tracheal deviation present. No thyroid mass present.  Cardiovascular: Normal rate, regular rhythm and normal heart sounds.  Exam reveals no gallop.   No murmur heard. Pulmonary/Chest: Effort normal and breath sounds normal. No stridor. No respiratory distress. He has no decreased breath sounds. He has no wheezes. He has no rhonchi. He has no rales.  Abdominal: Soft. Normal appearance and bowel sounds are normal. He exhibits no distension. There is no tenderness. There is no rebound and no CVA tenderness.  Musculoskeletal: Normal range of motion. He exhibits no edema or tenderness.  Neurological: He is alert and oriented to person, place, and time. He has normal strength. No cranial nerve deficit or sensory deficit. GCS eye subscore is 4. GCS verbal subscore is 5. GCS motor subscore is 6.  Skin: Skin is warm and dry. No abrasion and no rash noted.  Psychiatric: He has a normal mood and affect. His speech is normal and behavior is normal.  Nursing note  and vitals reviewed.   ED Course  Procedures (including critical care time) Labs Review Labs Reviewed  CBC WITH DIFFERENTIAL/PLATELET - Abnormal; Notable for the following:    WBC 3.6 (*)    All other components within normal limits  COMPREHENSIVE METABOLIC PANEL  LIPASE, BLOOD  D-DIMER, QUANTITATIVE (NOT AT [facility])  Randolm Idol, ED    Imaging Review Dg Chest 2 View  07/31/2015  CLINICAL DATA:  Chest pain radiating to the back. EXAM: CHEST  2 VIEW  COMPARISON:  CT scan and chest x-ray dated 06/11/2015 FINDINGS: Port-A-Cath in place, unchanged. Heart size and pulmonary vascularity are normal. The lungs are clear. No effusions. Slight reversal of the thoracic kyphosis, unchanged. IMPRESSION: No active cardiopulmonary disease. Electronically Signed   By: Lorriane Shire M.D.   On: 07/31/2015 15:49   I have personally reviewed and evaluated these images and lab results as part of my medical decision-making.   EKG Interpretation None      MDM   Final diagnoses:  None    ED ECG REPORT   Date: 07/31/2015  Rate: 70  Rhythm: normal sinus rhythm  QRS Axis: normal  Intervals: normal  ST/T Wave abnormalities: normal  Conduction Disutrbances:none  Narrative Interpretation:   Old EKG Reviewed: none available  I have personally reviewed the EKG tracing and agree with the computerized printout as noted.  10:20 PM Patient given pain medicine. At her here. He had elevated d-dimer and had a chest CT which was negative for PE but showed possible gallbladder pathology. Abdominal ultrasound showed thickened gallbladder wall without evidence of acute cholecystitis. He has no evidence of leukocytosis, and has normal lipase as well as LFTs. Repeat exam at time of discharge of the patient be pain-free. Will be given referral to general surgery  Lacretia Leigh, MD 07/31/15 2221

## 2015-07-31 NOTE — ED Notes (Signed)
Family at bedside. 

## 2015-07-31 NOTE — ED Notes (Signed)
Patient is alert and orientedx4.  Patient was explained discharge instructions and they understood them with no questions.  The patient's finacee, Richard [name] is taking the patient home.

## 2015-07-31 NOTE — ED Notes (Signed)
Patient transported to E-43 from Ultrasound.

## 2015-07-31 NOTE — ED Notes (Signed)
Pt c/o onset of chest pain 3 hours ago across lower  Chest.  Pt denies any nausea or vomiting. Onset of pain while at rest

## 2015-07-31 NOTE — Discharge Instructions (Signed)
Cholecystitis Cholecystitis is inflammation of the gallbladder. It is often called a gallbladder attack. The gallbladder is a pear-shaped organ that lies beneath the liver on the right side of the body. The gallbladder stores bile, which is a fluid that helps the body to digest fats. If bile builds up in your gallbladder, your gallbladder becomes inflamed. This condition may occur suddenly (be acute). Repeat episodes of acute cholecystitis or prolonged episodes may lead to a long-term (chronic) condition. Cholecystitis is serious and it requires treatment.  CAUSES The most common cause of this condition is gallstones. Gallstones can block the tube (duct) that carries bile out of your gallbladder. This causes bile to build up. Other causes of this condition include:  Damage to the gallbladder due to a decrease in blood flow.  Infections in the bile ducts.  Scars or kinks in the bile ducts.  Tumors in the liver, pancreas, or gallbladder. RISK FACTORS This condition is more likely to develop in:  People who have sickle cell disease.  People who take birth control pills or use estrogen.  People who have alcoholic liver disease.  People who have liver cirrhosis.  People who have their nutrition delivered through a vein (parenteral nutrition).  People who do not eat or drink (do fasting) for a long period of time.  People who are obese.  People who have rapid weight loss.  People who are pregnant.  People who have increased triglyceride levels.  People who have pancreatitis. SYMPTOMS Symptoms of this condition include:  Abdominal pain, especially in the upper right area of the abdomen.  Abdominal tenderness or bloating.  Nausea.  Vomiting.  Fever.  Chills.  Yellowing of the skin and the whites of the eyes (jaundice). DIAGNOSIS This condition is diagnosed with a medical history and physical exam. You may also have other tests, including:  Imaging tests, such as:  An  ultrasound of the gallbladder.  A CT scan of the abdomen.  A gallbladder nuclear scan (HIDA scan). This scan allows your health care provider to see the bile moving from your liver to your gallbladder and to your small intestine.  MRI.  Blood tests, such as:  A complete blood count, because the white blood cell count may be higher than normal.  Liver function tests, because some levels may be higher than normal with certain types of gallstones. TREATMENT Treatment may include:  Fasting for a certain amount of time.  IV fluids.  Medicine to treat pain or vomiting.  Antibiotic medicine.  Surgery to remove your gallbladder (cholecystectomy). This may happen immediately or at a later time. HOME CARE INSTRUCTIONS Home care will depend on your treatment. In general:  Take over-the-counter and prescription medicines only as told by your health care provider.  If you were prescribed an antibiotic medicine, take it as told by your health care provider. Do not stop taking the antibiotic even if you start to feel better.  Follow instructions from your health care provider about what to eat or drink. When you are allowed to eat, avoid eating or drinking anything that triggers your symptoms.  Keep all follow-up visits as told by your health care provider. This is important. SEEK MEDICAL CARE IF:  Your pain is not controlled with medicine.  You have a fever. SEEK IMMEDIATE MEDICAL CARE IF:  Your pain moves to another part of your abdomen or to your back.  You continue to have symptoms or you develop new symptoms even with treatment.   This information   is not intended to replace advice given to you by your health care provider. Make sure you discuss any questions you have with your health care provider.   Document Released: 08/31/2005 Document Revised: 05/22/2015 Document Reviewed: 12/12/2014 Elsevier Interactive Patient Education 2016 [employer]  

## 2015-07-31 NOTE — ED Notes (Signed)
Patient transported to CT 

## 2015-07-31 NOTE — ED Notes (Signed)
Patient transported to Ultrasound 

## 2015-07-31 NOTE — ED Notes (Signed)
MD at bedside. 

## 2015-09-13 DIAGNOSIS — K9400 Colostomy complication, unspecified: Secondary | ICD-10-CM

## 2015-09-13 HISTORY — DX: Colostomy complication, unspecified: K94.00

## 2015-09-17 ENCOUNTER — Emergency Department (HOSPITAL_COMMUNITY)
Admission: EM | Admit: 2015-09-17 | Discharge: 2015-09-17 | Disposition: A | Discharge status: Home / Self Care | Payer: Medicaid Other | Attending: Physician Assistant | Admitting: Physician Assistant

## 2015-09-17 ENCOUNTER — Emergency Department (HOSPITAL_COMMUNITY): Payer: Medicaid Other

## 2015-09-17 ENCOUNTER — Encounter (HOSPITAL_COMMUNITY): Payer: Self-pay | Admitting: *Deleted

## 2015-09-17 DIAGNOSIS — Z933 Colostomy status: Secondary | ICD-10-CM

## 2015-09-17 DIAGNOSIS — Z85038 Personal history of other malignant neoplasm of large intestine: Secondary | ICD-10-CM | POA: Insufficient documentation

## 2015-09-17 DIAGNOSIS — K9409 Other complications of colostomy: Secondary | ICD-10-CM | POA: Insufficient documentation

## 2015-09-17 DIAGNOSIS — F17210 Nicotine dependence, cigarettes, uncomplicated: Secondary | ICD-10-CM | POA: Diagnosis not present

## 2015-09-17 DIAGNOSIS — K9400 Colostomy complication, unspecified: Secondary | ICD-10-CM | POA: Insufficient documentation

## 2015-09-17 HISTORY — DX: Colostomy complication, unspecified: K94.00

## 2015-09-17 LAB — CBC WITH DIFFERENTIAL/PLATELET
BASOS ABS: 0 10*3/uL (ref 0.0–0.1)
BASOS PCT: 0 %
Eosinophils Absolute: 0.2 10*3/uL (ref 0.0–0.7)
Eosinophils Relative: 7 %
HEMATOCRIT: 37.8 % — AB (ref 39.0–52.0)
HEMOGLOBIN: 12.7 g/dL — AB (ref 13.0–17.0)
LYMPHS PCT: 18 %
Lymphs Abs: 0.6 10*3/uL — ABNORMAL LOW (ref 0.7–4.0)
MCH: 30.2 pg (ref 26.0–34.0)
MCHC: 33.6 g/dL (ref 30.0–36.0)
MCV: 89.8 fL (ref 78.0–100.0)
Monocytes Absolute: 0.4 10*3/uL (ref 0.1–1.0)
Monocytes Relative: 13 %
NEUTROS ABS: 2.1 10*3/uL (ref 1.7–7.7)
NEUTROS PCT: 62 %
Platelets: 260 10*3/uL (ref 150–400)
RBC: 4.21 MIL/uL — ABNORMAL LOW (ref 4.22–5.81)
RDW: 16.6 % — ABNORMAL HIGH (ref 11.5–15.5)
WBC: 3.3 10*3/uL — ABNORMAL LOW (ref 4.0–10.5)

## 2015-09-17 LAB — COMPREHENSIVE METABOLIC PANEL
ALBUMIN: 3.5 g/dL (ref 3.5–5.0)
ALK PHOS: 57 U/L (ref 38–126)
ALT: 27 U/L (ref 17–63)
ANION GAP: 8 (ref 5–15)
AST: 36 U/L (ref 15–41)
BUN: 16 mg/dL (ref 6–20)
CHLORIDE: 109 mmol/L (ref 101–111)
CO2: 23 mmol/L (ref 22–32)
Calcium: 8.5 mg/dL — ABNORMAL LOW (ref 8.9–10.3)
Creatinine, Ser: 1.42 mg/dL — ABNORMAL HIGH (ref 0.61–1.24)
GFR calc non Af Amer: 58 mL/min — ABNORMAL LOW (ref >60)
Glucose, Bld: 92 mg/dL (ref 65–99)
POTASSIUM: 3.7 mmol/L (ref 3.5–5.1)
Sodium: 140 mmol/L (ref 135–145)
Total Bilirubin: 0.8 mg/dL (ref 0.3–1.2)
Total Protein: 6.3 g/dL — ABNORMAL LOW (ref 6.5–8.1)

## 2015-09-17 MED ORDER — OXYCODONE-ACETAMINOPHEN 5-325 MG PO TABS
1.0000 | ORAL_TABLET | Freq: Once | ORAL | Status: AC
  Administered 2015-09-17: 1 via ORAL
  Filled 2015-09-17: qty 1

## 2015-09-17 MED ORDER — IOHEXOL 300 MG/ML  SOLN
100.0000 mL | Freq: Once | INTRAMUSCULAR | Status: AC | PRN
  Administered 2015-09-17: 100 mL via INTRAVENOUS

## 2015-09-17 MED ORDER — IOHEXOL 300 MG/ML  SOLN
25.0000 mL | INTRAMUSCULAR | Status: AC
  Administered 2015-09-17: 25 mL via ORAL

## 2015-09-17 NOTE — ED Notes (Signed)
PT taken a Sprite for PO challenge.
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
## 2015-09-17 NOTE — Discharge Instructions (Signed)
We did not see any evidence of cancer on her CT scan. Again we will need a follow-up with GI because they will be the ones that are more sensitive in finding new cancers. Please also follow-up with surgery and your PCP as well.   Colostomy Home Guide A colostomy is an opening for stool to leave your body when a medical condition prevents it from leaving through the usual opening (rectum). During a surgery, a piece of large intestine (colon) is brought through a hole in the abdominal wall. The new opening is called a stoma or ostomy. A bag or pouch fits over the stoma to catch stool and gas. Your stool may be liquid, somewhat pasty, or formed. CARING FOR YOUR STOMA  Normally, the stoma looks a lot like the inside of your cheek: pink, red, and moist. At first it may be swollen, but this swelling will decrease within 6 weeks. Keep the skin around your stoma clean and dry. You can gently wash your stoma and the skin around your stoma in the shower with a clean, soft washcloth. If you develop any skin irritation, your caregiver may give you a stoma powder or ointment to help heal the area. Do not use any products other than those specifically given to you by your caregiver.  Your stoma should not be uncomfortable. If you notice any stinging or burning, your pouch may be leaking, and the skin around your stoma may be coming into contact with stool. This can cause skin irritation. If you notice stinging, replace your pouch with a new one and discard the old one. OSTOMY POUCHES  The pouch that fits over the ostomy can be made up of either 1 or 2 pieces. A one-piece pouch has a skin barrier piece and the pouch itself in one unit. A two-piece pouch has a skin barrier with a separate pouch that snaps on and off of the skin barrier. Either way, you should empty the pouch when it is only  to  full. Do not let more stool or gas build up. This could cause the pouch to leak. Some ostomy bags have a built-in gas release  valve. Ostomy deodorizer (5 drops) can be put into the pouch to prevent odor. Some people use ostomy lubricant drops inside the pouch to help the stool slide out of the bag more easily and completely.  EMPTYING YOUR OSTOMY POUCH  You may get lessons on how to empty your pouch from a wound-ostomy nurse before you leave the hospital. Here are the basic steps:  Wash your hands with soap and water.  Sit far back on the toilet.  Put several pieces of toilet paper into the toilet water. This will prevent splashing as you empty the stool into the toilet bowl.  Unclip or unvelcro the tail end of the pouch.  Unroll the tail and empty stool into the toilet.  Clean the tail with toilet paper.  Reroll the tail, and clip or velcro it closed.  Wash your hands again. CHANGING YOUR OSTOMY POUCH  Change your ostomy pouch about every 3 to 4 days for the first 6 weeks, then every 5 to7 days. Always change the bag sooner if there is any leakage or you begin to notice any discomfort or irritation of the skin around the stoma. When possible, plan to change your ostomy pouch before eating or drinking as this will lessen the chance of stool coming out during the pouch change. A wound-ostomy nurse may teach you how  to change your pouch before you leave the hospital. Here are the basic steps:  Lay out your supplies.  Wash your hands with soap and water.  Carefully remove the old pouch.  Wash the stoma and allow it to dry. Men may be advised to shave any hair around the stoma very carefully. This will make the adhesive stick better.  Use the stoma measuring guide that comes with your pouch set to decide what size hole you will need to cut in the skin barrier piece. Choose the smallest possible size that will hold the stoma but will not touch it.  Use the guide to trace the circle on the back of the skin barrier piece. Cut out the hole.  Hold the skin barrier piece over the stoma to make sure the hole is the  correct size.  Remove the adhesive paper backing from the skin barrier piece.  Squeeze stoma paste around the opening of the skin barrier piece.  Clean and dry the skin around the stoma again.  Carefully fit the skin barrier piece over your stoma.  If you are using a two-piece pouch, snap the pouch onto the skin barrier piece.  Close the tail of the pouch.  Put your hand over the top of the skin barrier piece to help warm it for about 5 minutes, so that it conforms to your body better.  Wash your hands again. DIET TIPS   Continue to follow your usual diet.  Drink about eight 8 oz glasses of water each day.  You can prevent gas by eating slowly and chewing your food thoroughly.  If you feel concerned that you have too much gas, you can cut back on gas-producing foods, such as:  Spicy foods.  Onions and garlic.  Cruciferous vegetables (cabbage, broccoli, cauliflower, Brussels sprouts).  Beans and legumes.  Some cheeses.  Eggs.  Fish.  Bubbly (carbonated) drinks.  Chewing gum. GENERAL TIPS   You can shower with or without the bag in place.  Always keep the bag on if you are bathing or swimming.  If your bag gets wet, you can dry it with a blow-dryer set to cool.  Avoid wearing tight clothing directly over your stoma so that it does not become irritated or bleed. Tight clothing can also prevent stool from draining into the pouch.  It is helpful to always have an extra skin barrier and pouch with you when traveling. Do not leave them anywhere too warm, as parts of them can melt.  Do not let your seat belt rest on your stoma. Try to keep the seat belt either above or below your stoma, or use a tiny pillow to cushion it.  You can still participate in sports, but you should avoid activities in which there is a risk of getting hit in the abdomen.  You can still have sex. It is a good idea to empty your pouch prior to sex. Some people and their partners feel very  comfortable seeing the pouch during sex. Others choose to wear lingerie or a T-shirt that covers the device. SEEK IMMEDIATE MEDICAL CARE IF:  You notice a change in the size or color of the stoma, especially if it becomes very red, purple, black, or pale white.  You have bloody stools or bleeding from the stoma.  You have abdominal pain, nausea, vomiting, or bloating.  There is anything unusual protruding from the stoma.  You have irritation or red skin around the stoma.  No stool is  passing from the stoma.  You have diarrhea (requiring more frequent than normal pouch emptying).   This information is not intended to replace advice given to you by your health care provider. Make sure you discuss any questions you have with your health care provider.   Document Released: 09/03/2003 Document Revised: 11/23/2011 Document Reviewed: 01/28/2011 Elsevier Interactive Patient Education [employer].

## 2015-09-17 NOTE — ED Notes (Signed)
PT is tolerating PO liquid and food.

## 2015-09-17 NOTE — ED Notes (Signed)
PT reports he saw a increase of blood in his colostomy bag on Friday 09-13-15. Pt also reports increased ABD pain.

## 2015-10-07 ENCOUNTER — Encounter (HOSPITAL_COMMUNITY): Payer: Self-pay | Admitting: Family Medicine

## 2015-10-07 ENCOUNTER — Emergency Department (HOSPITAL_COMMUNITY): Payer: Medicaid Other

## 2015-10-07 ENCOUNTER — Emergency Department (HOSPITAL_COMMUNITY)
Admission: EM | Admit: 2015-10-07 | Discharge: 2015-10-08 | Disposition: A | Discharge status: Other Acute Inpatient Hospital | Payer: Medicaid Other | Attending: Emergency Medicine | Admitting: Emergency Medicine

## 2015-10-07 ENCOUNTER — Emergency Department (HOSPITAL_COMMUNITY)
Admission: EM | Admit: 2015-10-07 | Discharge: 2015-10-07 | Disposition: A | Discharge status: Home / Self Care | Payer: Medicaid Other | Attending: Emergency Medicine | Admitting: Emergency Medicine

## 2015-10-07 ENCOUNTER — Encounter (HOSPITAL_COMMUNITY): Payer: Self-pay | Admitting: Emergency Medicine

## 2015-10-07 DIAGNOSIS — R0602 Shortness of breath: Secondary | ICD-10-CM | POA: Insufficient documentation

## 2015-10-07 DIAGNOSIS — F17210 Nicotine dependence, cigarettes, uncomplicated: Secondary | ICD-10-CM | POA: Insufficient documentation

## 2015-10-07 DIAGNOSIS — F1210 Cannabis abuse, uncomplicated: Secondary | ICD-10-CM | POA: Insufficient documentation

## 2015-10-07 DIAGNOSIS — F1410 Cocaine abuse, uncomplicated: Secondary | ICD-10-CM | POA: Diagnosis not present

## 2015-10-07 DIAGNOSIS — Z85038 Personal history of other malignant neoplasm of large intestine: Secondary | ICD-10-CM | POA: Diagnosis not present

## 2015-10-07 DIAGNOSIS — R45851 Suicidal ideations: Secondary | ICD-10-CM

## 2015-10-07 DIAGNOSIS — F329 Major depressive disorder, single episode, unspecified: Secondary | ICD-10-CM | POA: Insufficient documentation

## 2015-10-07 DIAGNOSIS — R0789 Other chest pain: Secondary | ICD-10-CM | POA: Insufficient documentation

## 2015-10-07 DIAGNOSIS — R079 Chest pain, unspecified: Secondary | ICD-10-CM | POA: Diagnosis present

## 2015-10-07 LAB — BASIC METABOLIC PANEL
ANION GAP: 8 (ref 5–15)
BUN: 7 mg/dL (ref 6–20)
CALCIUM: 9.2 mg/dL (ref 8.9–10.3)
CO2: 25 mmol/L (ref 22–32)
CREATININE: 1.19 mg/dL (ref 0.61–1.24)
Chloride: 108 mmol/L (ref 101–111)
Glucose, Bld: 97 mg/dL (ref 65–99)
Potassium: 3.5 mmol/L (ref 3.5–5.1)
SODIUM: 141 mmol/L (ref 135–145)

## 2015-10-07 LAB — COMPREHENSIVE METABOLIC PANEL
ALBUMIN: 3.7 g/dL (ref 3.5–5.0)
ALT: 23 U/L (ref 17–63)
ANION GAP: 9 (ref 5–15)
AST: 23 U/L (ref 15–41)
Alkaline Phosphatase: 67 U/L (ref 38–126)
BILIRUBIN TOTAL: 0.7 mg/dL (ref 0.3–1.2)
BUN: 6 mg/dL (ref 6–20)
CHLORIDE: 107 mmol/L (ref 101–111)
CO2: 24 mmol/L (ref 22–32)
Calcium: 9.1 mg/dL (ref 8.9–10.3)
Creatinine, Ser: 1.23 mg/dL (ref 0.61–1.24)
GFR calc Af Amer: >60 mL/min (ref >60)
GFR calc non Af Amer: >60 mL/min (ref >60)
GLUCOSE: 125 mg/dL — AB (ref 65–99)
POTASSIUM: 3.6 mmol/L (ref 3.5–5.1)
SODIUM: 140 mmol/L (ref 135–145)
Total Protein: 6.9 g/dL (ref 6.5–8.1)

## 2015-10-07 LAB — RAPID URINE DRUG SCREEN, HOSP PERFORMED
AMPHETAMINES: NOT DETECTED
BARBITURATES: NOT DETECTED
BENZODIAZEPINES: NOT DETECTED
COCAINE: POSITIVE — AB
Opiates: NOT DETECTED
TETRAHYDROCANNABINOL: POSITIVE — AB

## 2015-10-07 LAB — CBC WITH DIFFERENTIAL/PLATELET
BASOS ABS: 0 10*3/uL (ref 0.0–0.1)
BASOS PCT: 0 %
EOS PCT: 9 %
Eosinophils Absolute: 0.3 10*3/uL (ref 0.0–0.7)
HCT: 40.7 % (ref 39.0–52.0)
Hemoglobin: 13.8 g/dL (ref 13.0–17.0)
LYMPHS PCT: 21 %
Lymphs Abs: 0.8 10*3/uL (ref 0.7–4.0)
MCH: 30.4 pg (ref 26.0–34.0)
MCHC: 33.9 g/dL (ref 30.0–36.0)
MCV: 89.6 fL (ref 78.0–100.0)
Monocytes Absolute: 0.4 10*3/uL (ref 0.1–1.0)
Monocytes Relative: 11 %
NEUTROS ABS: 2.3 10*3/uL (ref 1.7–7.7)
Neutrophils Relative %: 59 %
PLATELETS: 241 10*3/uL (ref 150–400)
RBC: 4.54 MIL/uL (ref 4.22–5.81)
RDW: 16.1 % — ABNORMAL HIGH (ref 11.5–15.5)
WBC: 3.9 10*3/uL — AB (ref 4.0–10.5)

## 2015-10-07 LAB — CBC
HEMATOCRIT: 40.8 % (ref 39.0–52.0)
Hemoglobin: 13.7 g/dL (ref 13.0–17.0)
MCH: 30.3 pg (ref 26.0–34.0)
MCHC: 33.6 g/dL (ref 30.0–36.0)
MCV: 90.3 fL (ref 78.0–100.0)
Platelets: 239 10*3/uL (ref 150–400)
RBC: 4.52 MIL/uL (ref 4.22–5.81)
RDW: 16.1 % — ABNORMAL HIGH (ref 11.5–15.5)
WBC: 3 10*3/uL — AB (ref 4.0–10.5)

## 2015-10-07 LAB — ETHANOL: Alcohol, Ethyl (B): <5 mg/dL (ref <5)

## 2015-10-07 LAB — D-DIMER, QUANTITATIVE: D-Dimer, Quant: 0.8 ug/mL-FEU — ABNORMAL HIGH (ref 0.00–0.50)

## 2015-10-07 LAB — SALICYLATE LEVEL: Salicylate Lvl: <4 mg/dL (ref 2.8–30.0)

## 2015-10-07 LAB — TROPONIN I

## 2015-10-07 LAB — ACETAMINOPHEN LEVEL

## 2015-10-07 MED ORDER — ACETAMINOPHEN 325 MG PO TABS: 650.0000 mg | ORAL_TABLET | ORAL | Status: DC | PRN

## 2015-10-07 MED ORDER — ASPIRIN 81 MG PO CHEW
324.0000 mg | CHEWABLE_TABLET | Freq: Once | ORAL | Status: AC
  Administered 2015-10-07: 324 mg via ORAL
  Filled 2015-10-07: qty 4

## 2015-10-07 MED ORDER — IBUPROFEN 400 MG PO TABS: 600.0000 mg | ORAL_TABLET | Freq: Three times a day (TID) | ORAL | Status: DC | PRN

## 2015-10-07 MED ORDER — MORPHINE SULFATE (PF) 4 MG/ML IV SOLN: 4.0000 mg | Freq: Once | INTRAVENOUS | Status: DC

## 2015-10-07 MED ORDER — NICOTINE 21 MG/24HR TD PT24: 21.0000 mg | MEDICATED_PATCH | Freq: Every day | TRANSDERMAL | Status: DC | PRN

## 2015-10-07 MED ORDER — OXYCODONE-ACETAMINOPHEN 5-325 MG PO TABS: 1.0000 | ORAL_TABLET | ORAL | Status: DC | PRN

## 2015-10-07 MED ORDER — FENTANYL CITRATE (PF) 100 MCG/2ML IJ SOLN: 50.0000 ug | Freq: Once | INTRAMUSCULAR | Status: DC

## 2015-10-07 MED ORDER — IOHEXOL 350 MG/ML SOLN
100.0000 mL | Freq: Once | INTRAVENOUS | Status: AC | PRN
  Administered 2015-10-07: 100 mL via INTRAVENOUS

## 2015-10-07 MED ORDER — ONDANSETRON HCL 4 MG PO TABS: 4.0000 mg | ORAL_TABLET | Freq: Three times a day (TID) | ORAL | Status: DC | PRN

## 2015-10-07 MED ORDER — ALUM & MAG HYDROXIDE-SIMETH 200-200-20 MG/5ML PO SUSP: 30.0000 mL | ORAL | Status: DC | PRN

## 2015-10-07 MED ORDER — DIPHENHYDRAMINE HCL 50 MG/ML IJ SOLN: 25.0000 mg | Freq: Once | INTRAMUSCULAR | Status: DC

## 2015-10-07 MED ORDER — KETOROLAC TROMETHAMINE 30 MG/ML IJ SOLN
30.0000 mg | Freq: Once | INTRAMUSCULAR | Status: AC
  Administered 2015-10-07: 30 mg via INTRAVENOUS
  Filled 2015-10-07: qty 1

## 2015-10-07 MED ORDER — ZOLPIDEM TARTRATE 5 MG PO TABS: 5.0000 mg | ORAL_TABLET | Freq: Every evening | ORAL | Status: DC | PRN

## 2015-10-07 NOTE — ED Notes (Signed)
Attempt to call behavioral health for follow up.

## 2015-10-07 NOTE — ED Notes (Signed)
Discussed need for urine sample collection. Patient states he already voided and wasn't aware one was needed.

## 2015-10-07 NOTE — ED Provider Notes (Signed)
CSN: [id number]     Arrival date & time 10/07/15  1302 History   First MD Initiated Contact with Patient 10/07/15 1853     Chief Complaint  Patient presents with  . Suicidal     (Consider location/radiation/quality/duration/timing/severity/associated sxs/prior Treatment) HPI   Richard [name] is a 50 y.o. male who is here for evaluation of suicidal ideation, without suicidal plan. He was seen earlier today in the emergency department, at that time he was evaluated for chest pain. After discharge he stayed in the emergency department lobby, and then became "overwhelmed". He describes this as a feeling of being out of control and having persistent thoughts of suicide. He has not done anything to harm himself. He has family and legal problems, which are stressful. He is also concerned about self-image because he has a colostomy. He states he lost his ability to make money because of his colon cancer. Is not currently seeing a therapist receiving treatment in psychiatry. He does not have chest pain at this time. There are no other known modifying factors.   Past Medical History  Diagnosis Date  . Colon cancer ([place])   . Colon cancer ([place]) 2015  . 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 Partial colectomy  06/2014    done at [facility]   Family History  Problem Relation Age of Onset  . Coronary artery disease Cousin 43   Social History  Substance Use Topics  . Smoking status: Current Every Day Smoker -- 0.50 packs/day    Types: Cigarettes  . Smokeless tobacco: Never Used  . Alcohol Use: 0.6 oz/week    1 Cans of beer per week     Comment: drinks 2 times a week    Review of Systems  All other systems reviewed and are negative.     Allergies  Morphine and related  Home Medications   Prior to Admission medications   Medication Sig Start Date End Date Taking? Authorizing Provider  oxyCODONE-acetaminophen  (PERCOCET) 5-325 MG tablet Take 1 tablet by mouth every 4 (four) hours as needed for moderate pain. Patient not taking: Reported on 10/07/2015 [id number]   Delora Fuel, MD   BP [id number] mmHg  Pulse 77  Temp(Src) 98 F (36.7 C) (Oral)  Resp 20  SpO2 96% Physical Exam  Constitutional: He is oriented to person, place, and time. He appears well-developed and well-nourished. No distress.  HENT:  Head: Normocephalic and atraumatic.  Right Ear: External ear normal.  Left Ear: External ear normal.  Eyes: Conjunctivae and EOM are normal. Pupils are equal, round, and reactive to light.  Neck: Normal range of motion and phonation normal. Neck supple.  Cardiovascular: Normal rate, regular rhythm and normal heart sounds.   Pulmonary/Chest: Effort normal. He exhibits no bony tenderness.  Abdominal: Soft. There is no tenderness.  Musculoskeletal: Normal range of motion.  Neurological: He is alert and oriented to person, place, and time. No cranial nerve deficit or sensory deficit. He exhibits normal muscle tone. Coordination normal.  Skin: Skin is warm, dry and intact.  Psychiatric: His behavior is normal. Judgment and thought content normal.  He appears depressed  Nursing note and vitals reviewed.   ED Course  Procedures (including critical care time)  Medications  acetaminophen (TYLENOL) tablet 650 mg (not administered)  ibuprofen (ADVIL,MOTRIN) tablet 600 mg (not administered)  zolpidem (AMBIEN) tablet 5 mg (not administered)  nicotine (NICODERM CQ - dosed in  mg/24 hours) patch 21 mg (not administered)  ondansetron (ZOFRAN) tablet 4 mg (not administered)  alum & mag hydroxide-simeth (MAALOX/MYLANTA) 200-200-20 MG/5ML suspension 30 mL (not administered)    Patient Vitals for the past 24 hrs:  BP Temp Temp src Pulse Resp SpO2  10/08/15 0014 117/69 mmHg 98 F (36.7 C) Oral 77 20 96 %  10/07/15 2015 106/71 mmHg 97.8 F (36.6 C) Oral 78 16 97 %  10/07/15 1654 109/78 mmHg - - 65 14 100 %   10/07/15 1308 115/83 mmHg 98.4 F (36.9 C) - 79 18 94 %   TTS Consult- arranged admission   00:20- he remains calm and comfortable and has no additional complaints or concerns.  Labs Review Labs Reviewed  COMPREHENSIVE METABOLIC PANEL - Abnormal; Notable for the following:    Glucose, Bld 125 (*)    All other components within normal limits  ACETAMINOPHEN LEVEL - Abnormal; Notable for the following:    Acetaminophen (Tylenol), Serum <10 (*)    All other components within normal limits  CBC - Abnormal; Notable for the following:    WBC 3.0 (*)    RDW 16.1 (*)    All other components within normal limits  URINE RAPID DRUG SCREEN, HOSP PERFORMED - Abnormal; Notable for the following:    Cocaine POSITIVE (*)    Tetrahydrocannabinol POSITIVE (*)    All other components within normal limits  ETHANOL  SALICYLATE LEVEL    Imaging Review Dg Chest 2 View  10/07/2015  CLINICAL DATA:  Acute onset of generalized chest pain and shortness of breath. Initial encounter. EXAM: CHEST  2 VIEW COMPARISON:  Chest radiograph and CTA of the chest performed 07/31/2015 FINDINGS: The lungs are well-aerated. Mild vascular congestion is noted. There is no evidence of focal opacification, pleural effusion or pneumothorax. The heart is normal in size; the mediastinal contour is within normal limits. No acute osseous abnormalities are seen. A left-sided chest port is noted ending about the proximal SVC. IMPRESSION: Mild vascular congestion noted.  Lungs remain grossly clear. Electronically Signed   By: Garald Balding M.D.   On: 10/07/2015 05:38   Ct Angio Chest Pe W/cm &/or Wo Cm  10/07/2015  CLINICAL DATA:  Severe upper chest pain on both sides. EXAM: CT ANGIOGRAPHY CHEST WITH CONTRAST TECHNIQUE: Multidetector CT imaging of the chest was performed using the standard protocol during bolus administration of intravenous contrast. Multiplanar CT image reconstructions and MIPs were obtained to evaluate the vascular  anatomy. CONTRAST:  148mL OMNIPAQUE IOHEXOL 350 MG/ML SOLN COMPARISON:  Chest CT 07/31/2015. FINDINGS: No filling defects in the pulmonary arteries to suggest pulmonary emboli. Heart is normal size. Aorta is normal caliber. No mediastinal, hilar, or axillary adenopathy. Chest wall soft tissues are unremarkable. Dependent atelectasis in the lower lobes bilaterally. No confluent opacities or pleural effusions. Imaging into the upper abdomen shows no acute findings. No acute bony abnormality. Review of the MIP images confirms the above findings. IMPRESSION: No evidence of pulmonary embolus. Dependent atelectasis in the lower lobes. No acute cardiopulmonary disease. Electronically Signed   By: Rolm Baptise M.D.   On: 10/07/2015 11:24   I have personally reviewed and evaluated these images and lab results as part of my medical decision-making.   EKG Interpretation None      MDM   Final diagnoses:  Suicidal ideation    Suicidal ideation, without plan. Patient will require patient for observation, treatment and psychiatric intervention.  Nursing Notes Reviewed/ Care Coordinated Applicable Imaging Reviewed Interpretation  of Laboratory Data incorporated into ED treatment   Plan: Admit/Transfer [facility]    Daleen Bo, MD 10/08/15 0025

## 2015-10-07 NOTE — BH Assessment (Addendum)
Tele Assessment Note   Richard [name] is an 50 y.o. male, single, African-American who presents unaccompanied to [facility] ED reporting symptoms of depression and anxiety, including suicidal ideation. Pt was in [place] earlier today for chest pain and was discharged. He reports he stayed in the ED because he was afraid if he left he would act on suicidal thoughts. Pt states is very depressed with symptoms including crying spells, social withdrawal, loss of interest in usual pleasures, fatigue, irritability, decreased concentration, decreased sleep and feelings of hopelessness. He states he feels "overwhelmed." He denies a specific plan to end his life but says that "everyone would be better off without me" and "I'm ready to give up." Pt says he abuses cocaine, alcohol and marijuana because it is the only way to "deal with what is going on in my head." He says if he leave the ED he fears he will get high and then be in the frame of mind to kill himself. Pt denies any history of suicide attempts. He denies any homicidal ideation or history of violence. Pt reports he has experienced auditory hallucinations when high but not when he is not using.  Pt reports a long history of substance abuse. He says he would use cocaine, alcohol and marijuana daily if he had the financial resources but because he has no money he uses a few times per month. He states his last use was yesterday.  Pt identifies numerous stressors. He reports he has surgery for colon cancer in October 2015 and now has a colostomy bag. Pt says he is very self-conscious about the bag and isolates himself. 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 He is currently homeless. He states he has family members but "they all have their own problems to deal with" and he doesn't consider them supportive. He has two sons, one age 35 and one who started  college, and he says he feels guilty he cannot care for them or tell them where he is when they call. Pt reports he has been to substance abuse treatment centers in the past but has never been to a psychiatric hospital or received mental health treatment. Pt reports he was at [facility] in 2016 and found it helpful but feels they didn't address his depression and anxiety.  Pt denies any current legal problems. Dr. Daleen Bo reports law enforcement arrived at ED today and said Pt had been charged with driving a stolen car. Dr. Eulis Foster said law enforcement said they were not going to take Pt into custody because they did not want to deal with Pt's colostomy bag. [place] Offender Search indicates substance-related charges, driving with license revoked, possession of a firearm by a felon but no convictions associated with assault.  Pt is dressed in hospital scrubs, alert, oriented x4 with normal speech and normal motor behavior. Eye contact is fair and Pt is tearful at times. Pt's mood is depressed, sad and hopeless; affect is congruent with mood. Thought process is coherent and relevant. There is no indication Pt is currently responding to internal stimuli or experiencing delusional thought content. Pt was cooperative throughout assessment. He states he wants inpatient treatment and would also like to speak with a Education officer, museum about housing options.    Diagnosis: Unspecified Depressive Disorder; Cocaine Use Disorder, Severe; Alcohol Use Disorder, Moderate; cannabis Use Disorder, Moderate  Past Medical History:  Past Medical History  Diagnosis Date  . Colon cancer ([place])   . Colon cancer ([place]) 2015  . 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 Partial colectomy  06/2014    done at [facility]    Family History:  Family History  Problem Relation Age of Onset  . Coronary artery disease Cousin 66    Social History:  reports that he  has been smoking Cigarettes.  He has been smoking about 0.50 packs per day. He has never used smokeless tobacco. He reports that he drinks about 0.6 oz of alcohol per week. He reports that he uses illicit drugs (Marijuana).  Additional Social History:  Alcohol / Drug Use Pain Medications: Denies abuse Prescriptions: Denies abuse Over the Counter: Denies abuse History of alcohol / drug use?: Yes Longest period of sobriety (when/how long): Unknown Negative Consequences of Use: Financial, Personal relationships, Work / School Substance #1 Name of Substance 1: Alcohol 1 - Age of First Use: Adolescent 1 - Amount (size/oz): 6-12 cans of beer 1 - Frequency: Daily when available 1 - Duration: Ongoing for years 1 - Last Use / Amount: 10/06/15, six beers Substance #2 Name of Substance 2: Cocaine 2 - Age of First Use: 22 2 - Amount (size/oz): Varies 2 - Frequency: Daily when available 2 - Duration: Ongoing for years 2 - Last Use / Amount: 10/06/15, $20 worth Substance #3 Name of Substance 3: Marijuana 3 - Age of First Use: Adolescent 3 - Amount (size/oz): varies 3 - Frequency: Daily when available 3 - Duration: Ongoing for years 3 - Last Use / Amount: 10/06/15  CIWA: CIWA-Ar BP: 106/71 mmHg Pulse Rate: 78 COWS:    PATIENT STRENGTHS: (choose at least two) Ability for insight Average or above average intelligence Capable of independent living Communication skills General fund of knowledge Motivation for treatment/growth Physical Health  Allergies:  Allergies  Allergen Reactions  . Morphine And Related Itching    Home Medications:  (Not in a hospital admission)  OB/GYN Status:  No LMP for male patient.  General Assessment Data Location of Assessment: [facility] ED TTS Assessment: In system Is this a Tele or Face-to-Face Assessment?: Tele Assessment Is this an Initial Assessment or a Re-assessment for this encounter?: Initial Assessment Marital status: Single Maiden name:  NA Is patient pregnant?: No Pregnancy Status: No Living Arrangements: Other (Comment) (Homeless) Can pt return to current living arrangement?: Yes Admission Status: Voluntary Is patient capable of signing voluntary admission?: Yes Referral Source: Self/Family/Friend Insurance type: Medicaid     Crisis Care Plan Living Arrangements: Other (Comment) (Homeless) Legal Guardian: Other: (None) Name of Psychiatrist: None Name of Therapist: None  Education Status Is patient currently in school?: No Current Grade: NA Highest grade of school patient has completed: Some college Name of school: NA Contact person: NA  Risk to self with the past 6 months Suicidal Ideation: Yes-Currently Present Has patient been a risk to self within the past 6 months prior to admission? : Yes Suicidal Intent: No Has patient had any suicidal intent within the past 6 months prior to admission? : No Is patient at risk for suicide?: Yes Suicidal Plan?: No Has patient had any suicidal plan within the past 6 months prior to admission? : No Access to Means: No What has been your use of drugs/alcohol within the last 12 months?: Pt using alcohol, cocaine and marijuana Previous Attempts/Gestures: No How many times?: 0  Other Self Harm Risks: None Triggers for Past Attempts: None known Intentional Self Injurious Behavior: None Family Suicide History: No Recent stressful life event(s): Job Loss, Financial Problems, Recent negative physical changes, Other (Comment) (Homeless) Persecutory voices/beliefs?: No Depression: Yes Depression Symptoms: Despondent, Tearfulness, Isolating, Fatigue, Guilt, Loss of interest in usual pleasures, Feeling worthless/self pity, Feeling angry/irritable, Insomnia Substance abuse history and/or treatment for substance abuse?: Yes Suicide prevention information given to non-admitted patients: Not applicable  Risk to Others within the past 6 months Homicidal Ideation: No Does patient  have any lifetime risk of violence toward others beyond the six months prior to admission? : No Thoughts of Harm to Others: No Current Homicidal Intent: No Current Homicidal Plan: No Access to Homicidal Means: No Identified Victim: None History of harm to others?: No Assessment of Violence: None Noted Violent Behavior Description: Pt denies history of violence Does patient have access to weapons?: No Criminal Charges Pending?: No Does patient have a court date: No Is patient on probation?: No  Psychosis Hallucinations: None noted Delusions: None noted  Mental Status Report Appearance/Hygiene: In scrubs Eye Contact: Fair Motor Activity: Unremarkable Speech: Logical/coherent Level of Consciousness: Alert Mood: Depressed, Anxious Affect: Depressed, Anxious Anxiety Level: Moderate Thought Processes: Coherent, Relevant Judgement: Unimpaired Orientation: Person, Place, Time, Situation, Appropriate for developmental age Obsessive Compulsive Thoughts/Behaviors: None  Cognitive Functioning Concentration: Normal Memory: Recent Intact, Remote Intact IQ: Average Insight: Fair Impulse Control: Fair Appetite: Good Weight Loss: 0 Weight Gain: 0 Sleep: Decreased Total Hours of Sleep: 6 Vegetative Symptoms: None  ADLScreening [facility] Assessment Services) Patient's cognitive ability adequate to safely complete daily activities?: Yes Patient able to express need for assistance with ADLs?: Yes Independently performs ADLs?: Yes (appropriate for developmental age)  Prior Inpatient Therapy Prior Inpatient Therapy: Yes Prior Therapy Dates: 2016 Prior Therapy Facilty/Provider(s): [facility] Reason for Treatment: substance abuse  Prior Outpatient Therapy Prior Outpatient Therapy: No Prior Therapy Dates: NA Prior Therapy Facilty/Provider(s): NA Reason for Treatment: NA Does patient have an ACCT team?: No Does patient have Intensive In-House Services?  : No Does patient have [facility]  services? : No Does patient have P4CC services?: No  ADL Screening (condition at time of admission) Patient's cognitive ability adequate to safely complete daily activities?: Yes Is the patient deaf or have difficulty hearing?: No Does the patient have difficulty seeing, even when wearing glasses/contacts?: No Does the patient have difficulty concentrating, remembering, or making decisions?: No Patient able to express need for assistance with ADLs?: Yes Does the patient have difficulty dressing or bathing?: No Independently performs ADLs?: Yes (appropriate for developmental age) Does the patient have difficulty walking or climbing stairs?: No Weakness of Legs: None Weakness of Arms/Hands: None  Home Assistive Devices/Equipment Home Assistive Devices/Equipment: Other (Comment) (Colostomy bag)    Abuse/Neglect Assessment (Assessment to be complete while patient is alone) Physical Abuse: Denies Verbal Abuse: Denies Sexual Abuse: Denies Exploitation of patient/patient's resources: Denies Self-Neglect: Denies     Regulatory affairs officer (For Healthcare) Does patient have an advance directive?: No Would patient like information on creating an advanced directive?: No - patient declined information    Additional Information 1:1 In Past 12 Months?: No CIRT Risk: No Elopement Risk: No Does patient have medical clearance?: Yes     Disposition: Inocencio Homes, AC at [facility], confirms adult bed will be available later this evening. Gave clinical report to Patriciaann Clan, PA-C who said Pt meets criteria for inpatient psychiatric treatment and accepted Pt to the service of Dr. Gabriel Earing, room  406-2[address]arna Archer at [facility] will call [facility] when bed is available. Notified Dr. Daleen Bo and Gretta Arab, RN of acceptance.  Disposition Initial Assessment Completed for this Encounter: Yes Disposition of Patient: Other dispositions Other disposition(s): Other (Comment)   Evelena Peat, [facility], [facility],  [facility] Triage Specialist [phone]   Anson Fret, Orpah Greek 10/07/2015 9:17 PM

## 2015-10-07 NOTE — ED Notes (Signed)
TTS being conducted.

## 2015-10-07 NOTE — ED Provider Notes (Signed)
CSN: [id number]     Arrival date & time 10/07/15  0431 History   First MD Initiated Contact with Patient 10/07/15 [phone]     Chief Complaint  Patient presents with  . Chest Pain  . Shortness of Breath     (Consider location/radiation/quality/duration/timing/severity/associated sxs/prior Treatment) Patient is a 50 y.o. male presenting with chest pain and shortness of breath. The history is provided by the patient.  Chest Pain Associated symptoms: shortness of breath   Shortness of Breath Associated symptoms: chest pain   He comes in with chest pain which started about midnight. Onset was at rest. He describes a cramping pain across his anterior chest which he rates at 8/10. There is no associated dyspnea, nausea, diaphoresis. Nothing makes the pain better nothing makes it worse. Specifically, it is not affected by deep breath, body position, movement. He has had similar pains over the last 2 years and has been seen in emergency department several times for similar pains. He is a cigarette smoker and also has a history of colon cancer. He denies history of hypertension, diabetes, hyperlipidemia and denies family history of premature coronary atherosclerosis.  Past Medical History  Diagnosis Date  . Colon cancer ([place])   . Colon cancer ([place]) 2015  . 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 Partial colectomy  06/2014    done at [facility]   Family History  Problem Relation Age of Onset  . Coronary artery disease Cousin 23   Social History  Substance Use Topics  . Smoking status: Current Every Day Smoker -- 0.50 packs/day    Types: Cigarettes  . Smokeless tobacco: Never Used  . Alcohol Use: 0.6 oz/week    1 Cans of beer per week     Comment: drinks 2 times a week    Review of Systems  Respiratory: Positive for shortness of breath.   Cardiovascular: Positive for chest pain.  All other systems reviewed and  are negative.     Allergies  Morphine and related  Home Medications   Prior to Admission medications   Medication Sig Start Date End Date Taking? Authorizing Provider  calcium carbonate (TUMS - DOSED IN MG ELEMENTAL CALCIUM) 500 MG chewable tablet Chew 1 tablet by mouth as needed for indigestion or heartburn.    Historical Provider, MD  ibuprofen (ADVIL,MOTRIN) 200 MG tablet Take 200 mg by mouth every 6 (six) hours as needed for fever.    Historical Provider, MD   BP 121/89 mmHg  Pulse 79  Temp(Src) 97.6 F (36.4 C) (Oral)  Resp 18  Ht 5\' 8"  (1.727 m)  Wt 230 lb (104.327 kg)  BMI 34.98 kg/m2  SpO2 97% Physical Exam  Nursing note and vitals reviewed.  50 year old male, resting comfortably and in no acute distress. Vital signs are normal. Oxygen saturation is 97%, which is normal. Head is normocephalic and atraumatic. PERRLA, EOMI. Oropharynx is clear. Neck is nontender and supple without adenopathy or JVD. Back is nontender and there is no CVA tenderness. Lungs are clear without rales, wheezes, or rhonchi. Chest is moderately tender across the precordium. Heart has regular rate and rhythm without murmur. Abdomen is soft, flat, nontender without masses or hepatosplenomegaly and peristalsis is normoactive. Colostomy is present in left lower quadrant and is draining normally. Extremities have no cyanosis or edema, full range of motion is present. Skin is warm and dry without rash. Neurologic: Mental status  is normal, cranial nerves are intact, there are no motor or sensory deficits.  ED Course  Procedures (including critical care time) Labs Review Results for orders placed or performed during the hospital encounter of 10/07/15  Troponin I  Result Value Ref Range   Troponin I <0.03 <0.031 ng/mL  CBC with Differential  Result Value Ref Range   WBC 3.9 (L) 4.0 - 10.5 K/uL   RBC 4.54 4.22 - 5.81 MIL/uL   Hemoglobin 13.8 13.0 - 17.0 g/dL   HCT 40.7 39.0 - 52.0 %   MCV 89.6  78.0 - 100.0 fL   MCH 30.4 26.0 - 34.0 pg   MCHC 33.9 30.0 - 36.0 g/dL   RDW 16.1 (H) 11.5 - 15.5 %   Platelets 241 150 - 400 K/uL   Neutrophils Relative % 59 %   Neutro Abs 2.3 1.7 - 7.7 K/uL   Lymphocytes Relative 21 %   Lymphs Abs 0.8 0.7 - 4.0 K/uL   Monocytes Relative 11 %   Monocytes Absolute 0.4 0.1 - 1.0 K/uL   Eosinophils Relative 9 %   Eosinophils Absolute 0.3 0.0 - 0.7 K/uL   Basophils Relative 0 %   Basophils Absolute 0.0 0.0 - 0.1 K/uL  D-dimer, quantitative  Result Value Ref Range   D-Dimer, Quant 0.80 (H) 0.00 - 0.50 ug/mL-FEU  Basic metabolic panel  Result Value Ref Range   Sodium 141 135 - 145 mmol/L   Potassium 3.5 3.5 - 5.1 mmol/L   Chloride 108 101 - 111 mmol/L   CO2 25 22 - 32 mmol/L   Glucose, Bld 97 65 - 99 mg/dL   BUN 7 6 - 20 mg/dL   Creatinine, Ser 1.19 0.61 - 1.24 mg/dL   Calcium 9.2 8.9 - 10.3 mg/dL   GFR calc non Af Amer >60 >60 mL/min   GFR calc Af Amer >60 >60 mL/min   Anion gap 8 5 - 15   Imaging Review Dg Chest 2 View  10/07/2015  CLINICAL DATA:  Acute onset of generalized chest pain and shortness of breath. Initial encounter. EXAM: CHEST  2 VIEW COMPARISON:  Chest radiograph and CTA of the chest performed 07/31/2015 FINDINGS: The lungs are well-aerated. Mild vascular congestion is noted. There is no evidence of focal opacification, pleural effusion or pneumothorax. The heart is normal in size; the mediastinal contour is within normal limits. No acute osseous abnormalities are seen. A left-sided chest port is noted ending about the proximal SVC. IMPRESSION: Mild vascular congestion noted.  Lungs remain grossly clear. Electronically Signed   By: Garald Balding M.D.   On: 10/07/2015 05:38   I have personally reviewed and evaluated these images and lab results as part of my medical decision-making.   EKG Interpretation   Date/Time:  Monday October 07 2015 04:39:55 EST Ventricular Rate:  77 PR Interval:  142 QRS Duration: 112 QT Interval:   431 QTC Calculation: 488 R Axis:   61 Text Interpretation:  Sinus rhythm Premature atrial complexes Borderline  intraventricular conduction delay Abnormal R-wave progression, early  transition Minimal ST elevation, anterior leads Borderline prolonged QT  interval When compared with ECG of 07/31/2015, No significant change was  found Confirmed by [facility]  MD, Jaegar Croft ([id number]) on 10/07/2015 4:43:48 AM      MDM   Final diagnoses:  Atypical chest pain    Chest pain of uncertain cause. Old records are reviewed and he has had ED visits for chest pain but I see no sign of any cardiac  evaluation beyond ED visits. Because of cancer history, he will be screened for pulmonary embolism with d-dimer. ECG is unremarkable. He will also be given therapeutic trial of ketorolac.  Initial workup is significant only for mild elevation of d-dimer. On further review of records, he has had 2 CT angiograms of the chest in the past 6 months. On those occasions, d-dimer was higher than current level. Therefore, I do not feel he needs to have CT angiogram. He had only slight relief of pain with ketorolac. He'll be given morphine for pain. Given frequent visits to the ED for chest pain, I do feel that cardiology evaluation with stress testing might be useful to help with future risk stratification. He will be referred to cardiology for consideration for stress testing once he is comfortable. Case is signed out to Dr. Vanita Panda for evaluation of response to low-dose morphine.  Delora Fuel, MD [phone]

## 2015-10-07 NOTE — ED Notes (Signed)
Pt placed in scrubs, wanded and staffing called for sitter.

## 2015-10-07 NOTE — Discharge Instructions (Signed)
Make an appointment with the cardiologist for further testing as an outpatient.  Nonspecific Chest Pain  Chest pain can be caused by many different conditions. There is always a chance that your pain could be related to something serious, such as a heart attack or a blood clot in your lungs. Chest pain can also be caused by conditions that are not life-threatening. If you have chest pain, it is very important to follow up with your health care provider. CAUSES  Chest pain can be caused by:  Heartburn.  Pneumonia or bronchitis.  Anxiety or stress.  Inflammation around your heart (pericarditis) or lung (pleuritis or pleurisy).  A blood clot in your lung.  A collapsed lung (pneumothorax). It can develop suddenly on its own (spontaneous pneumothorax) or from trauma to the chest.  Shingles infection (varicella-zoster virus).  Heart attack.  Damage to the bones, muscles, and cartilage that make up your chest wall. This can include:  Bruised bones due to injury.  Strained muscles or cartilage due to frequent or repeated coughing or overwork.  Fracture to one or more ribs.  Sore cartilage due to inflammation (costochondritis). RISK FACTORS  Risk factors for chest pain may include:  Activities that increase your risk for trauma or injury to your chest.  Respiratory infections or conditions that cause frequent coughing.  Medical conditions or overeating that can cause heartburn.  Heart disease or family history of heart disease.  Conditions or health behaviors that increase your risk of developing a blood clot.  Having had chicken pox (varicella zoster). SIGNS AND SYMPTOMS Chest pain can feel like:  Burning or tingling on the surface of your chest or deep in your chest.  Crushing, pressure, aching, or squeezing pain.  Dull or sharp pain that is worse when you move, cough, or take a deep breath.  Pain that is also felt in your back, neck, shoulder, or arm, or pain that  spreads to any of these areas. Your chest pain may come and go, or it may stay constant. DIAGNOSIS Lab tests or other studies may be needed to find the cause of your pain. Your health care provider may have you take a test called an ambulatory ECG (electrocardiogram). An ECG records your heartbeat patterns at the time the test is performed. You may also have other tests, such as:  Transthoracic echocardiogram (TTE). During echocardiography, sound waves are used to create a picture of all of the heart structures and to look at how blood flows through your heart.  Transesophageal echocardiogram (TEE).This is a more advanced imaging test that obtains images from inside your body. It allows your health care provider to see your heart in finer detail.  Cardiac monitoring. This allows your health care provider to monitor your heart rate and rhythm in real time.  Holter monitor. This is a portable device that records your heartbeat and can help to diagnose abnormal heartbeats. It allows your health care provider to track your heart activity for several days, if needed.  Stress tests. These can be done through exercise or by taking medicine that makes your heart beat more quickly.  Blood tests.  Imaging tests. TREATMENT  Your treatment depends on what is causing your chest pain. Treatment may include:  Medicines. These may include:  Acid blockers for heartburn.  Anti-inflammatory medicine.  Pain medicine for inflammatory conditions.  Antibiotic medicine, if an infection is present.  Medicines to dissolve blood clots.  Medicines to treat coronary artery disease.  Supportive care for conditions  that do not require medicines. This may include:  Resting.  Applying heat or cold packs to injured areas.  Limiting activities until pain decreases. HOME CARE INSTRUCTIONS  If you were prescribed an antibiotic medicine, finish it all even if you start to feel better.  Avoid any activities  that bring on chest pain.  Do not use any tobacco products, including cigarettes, chewing tobacco, or electronic cigarettes. If you need help quitting, ask your health care provider.  Do not drink alcohol.  Take medicines only as directed by your health care provider.  Keep all follow-up visits as directed by your health care provider. This is important. This includes any further testing if your chest pain does not go away.  If heartburn is the cause for your chest pain, you may be told to keep your head raised (elevated) while sleeping. This reduces the chance that acid will go from your stomach into your esophagus.  Make lifestyle changes as directed by your health care provider. These may include:  Getting regular exercise. Ask your health care provider to suggest some activities that are safe for you.  Eating a heart-healthy diet. A registered dietitian can help you to learn healthy eating options.  Maintaining a healthy weight.  Managing diabetes, if necessary.  Reducing stress. SEEK MEDICAL CARE IF:  Your chest pain does not go away after treatment.  You have a rash with blisters on your chest.  You have a fever. SEEK IMMEDIATE MEDICAL CARE IF:   Your chest pain is worse.  You have an increasing cough, or you cough up blood.  You have severe abdominal pain.  You have severe weakness.  You faint.  You have chills.  You have sudden, unexplained chest discomfort.  You have sudden, unexplained discomfort in your arms, back, neck, or jaw.  You have shortness of breath at any time.  You suddenly start to sweat, or your skin gets clammy.  You feel nauseous or you vomit.  You suddenly feel light-headed or dizzy.  Your heart begins to beat quickly, or it feels like it is skipping beats. These symptoms may represent a serious problem that is an emergency. Do not wait to see if the symptoms will go away. Get medical help right away. Call your local emergency  services (911 in the [place] Do not drive yourself to the hospital.   This information is not intended to replace advice given to you by your health care provider. Make sure you discuss any questions you have with your health care provider.   Document Released: 06/10/2005 Document Revised: 09/21/2014 Document Reviewed: 04/06/2014 Elsevier Interactive Patient Education 2016 Elsevier Inc.  Acetaminophen; Oxycodone tablets What is this medicine? ACETAMINOPHEN; OXYCODONE (a set a MEE noe fen; ox i KOE done) is a pain reliever. It is used to treat moderate to severe pain. This medicine may be used for other purposes; ask your health care provider or pharmacist if you have questions. What should I tell my health care provider before I take this medicine? They need to know if you have any of these conditions: -brain tumor -Crohn's disease, inflammatory bowel disease, or ulcerative colitis -drug abuse or addiction -head injury -heart or circulation problems -if you often drink alcohol -kidney disease or problems going to the bathroom -liver disease -lung disease, asthma, or breathing problems -an unusual or allergic reaction to acetaminophen, oxycodone, other opioid analgesics, other medicines, foods, dyes, or preservatives -pregnant or trying to get pregnant -breast-feeding How should I use this medicine?  Take this medicine by mouth with a full glass of water. Follow the directions on the prescription label. You can take it with or without food. If it upsets your stomach, take it with food. Take your medicine at regular intervals. Do not take it more often than directed. Talk to your pediatrician regarding the use of this medicine in children. Special care may be needed. Patients over 34 years old may have a stronger reaction and need a smaller dose. Overdosage: If you think you have taken too much of this medicine contact a poison control center or emergency room at once. NOTE: This medicine  is only for you. Do not share this medicine with others. What if I miss a dose? If you miss a dose, take it as soon as you can. If it is almost time for your next dose, take only that dose. Do not take double or extra doses. What may interact with this medicine? -alcohol -antihistamines -barbiturates like amobarbital, butalbital, butabarbital, methohexital, pentobarbital, phenobarbital, thiopental, and secobarbital -benztropine -drugs for bladder problems like solifenacin, trospium, oxybutynin, tolterodine, hyoscyamine, and methscopolamine -drugs for breathing problems like ipratropium and tiotropium -drugs for certain stomach or intestine problems like propantheline, homatropine methylbromide, glycopyrrolate, atropine, belladonna, and dicyclomine -general anesthetics like etomidate, ketamine, nitrous oxide, propofol, desflurane, enflurane, halothane, isoflurane, and sevoflurane -medicines for depression, anxiety, or psychotic disturbances -medicines for sleep -muscle relaxants -naltrexone -narcotic medicines (opiates) for pain -phenothiazines like perphenazine, thioridazine, chlorpromazine, mesoridazine, fluphenazine, prochlorperazine, promazine, and trifluoperazine -scopolamine -tramadol -trihexyphenidyl This list may not describe all possible interactions. Give your health care provider a list of all the medicines, herbs, non-prescription drugs, or dietary supplements you use. Also tell them if you smoke, drink alcohol, or use illegal drugs. Some items may interact with your medicine. What should I watch for while using this medicine? Tell your doctor or health care professional if your pain does not go away, if it gets worse, or if you have new or a different type of pain. You may develop tolerance to the medicine. Tolerance means that you will need a higher dose of the medication for pain relief. Tolerance is normal and is expected if you take this medicine for a long time. Do not  suddenly stop taking your medicine because you may develop a severe reaction. Your body becomes used to the medicine. This does NOT mean you are addicted. Addiction is a behavior related to getting and using a drug for a non-medical reason. If you have pain, you have a medical reason to take pain medicine. Your doctor will tell you how much medicine to take. If your doctor wants you to stop the medicine, the dose will be slowly lowered over time to avoid any side effects. You may get drowsy or dizzy. Do not drive, use machinery, or do anything that needs mental alertness until you know how this medicine affects you. Do not stand or sit up quickly, especially if you are an older patient. This reduces the risk of dizzy or fainting spells. Alcohol may interfere with the effect of this medicine. Avoid alcoholic drinks. There are different types of narcotic medicines (opiates) for pain. If you take more than one type at the same time, you may have more side effects. Give your health care provider a list of all medicines you use. Your doctor will tell you how much medicine to take. Do not take more medicine than directed. Call emergency for help if you have problems breathing. The medicine will cause constipation. Try  to have a bowel movement at least every 2 to 3 days. If you do not have a bowel movement for 3 days, call your doctor or health care professional. Do not take Tylenol (acetaminophen) or medicines that have acetaminophen with this medicine. Too much acetaminophen can be very dangerous. Many nonprescription medicines contain acetaminophen. Always read the labels carefully to avoid taking more acetaminophen. What side effects may I notice from receiving this medicine? Side effects that you should report to your doctor or health care professional as soon as possible: -allergic reactions like skin rash, itching or hives, swelling of the face, lips, or tongue -breathing difficulties,  wheezing -confusion -light headedness or fainting spells -severe stomach pain -unusually weak or tired -yellowing of the skin or the whites of the eyes Side effects that usually do not require medical attention (report to your doctor or health care professional if they continue or are bothersome): -dizziness -drowsiness -nausea -vomiting This list may not describe all possible side effects. Call your doctor for medical advice about side effects. You may report side effects to FDA at 1-800-FDA-1088. Where should I keep my medicine? Keep out of the reach of children. This medicine can be abused. Keep your medicine in a safe place to protect it from theft. Do not share this medicine with anyone. Selling or giving away this medicine is dangerous and against the law. This medicine may cause accidental overdose and death if it taken by other adults, children, or pets. Mix any unused medicine with a substance like cat litter or coffee grounds. Then throw the medicine away in a sealed container like a sealed bag or a coffee can with a lid. Do not use the medicine after the expiration date. Store at room temperature between 20 and 25 degrees C (68 and 77 degrees F). NOTE: This sheet is a summary. It may not cover all possible information. If you have questions about this medicine, talk to your doctor, pharmacist, or health care provider.    2016, Elsevier/Gold Standard. (2014-08-01 15:18:46)

## 2015-10-07 NOTE — ED Notes (Signed)
Planning for admission to behavioral health, with accepting provider being Dr. Parke Poisson after speaking with Marijean Bravo. Receiving facility to call once room is prepared.

## 2015-10-07 NOTE — ED Notes (Signed)
CP & SOB began this evening. Worse with exertion, deep breathing.

## 2015-10-07 NOTE — ED Notes (Signed)
Spoke with Herbert Spires, AC at behavioral, awaiting return call.

## 2015-10-07 NOTE — ED Provider Notes (Signed)
Patient continues to have pain, after IV meds  With ongoing dyspnea, pos dimer, though he has had neg studies in the past, CT ordered.   11:31 AM No new complaints.  Patient has been sleeping.  CTA unremarkable.  Patient will f/u w PMD  Carmin Muskrat, MD 10/07/15 1132

## 2015-10-07 NOTE — ED Notes (Signed)
Pt here and stating that he was seen here earlier today and when he left he felt suicidal.

## 2015-10-07 NOTE — ED Notes (Signed)
Requested urine sample.  

## 2015-10-07 NOTE — ED Notes (Signed)
Pt had visitors trying to see him, pt refused to see anyone and one of his visitors made security aware that pt had stolen her car and drove it to here to [facility].  [employer] made aware.

## 2015-10-08 ENCOUNTER — Inpatient Hospital Stay (HOSPITAL_COMMUNITY)
Admission: AD | Admit: 2015-10-08 | Discharge: 2015-10-16 | DRG: 885 | Disposition: A | Discharge status: Home / Self Care | Payer: Medicaid Other | Source: Intra-hospital | Attending: Psychiatry | Admitting: Psychiatry

## 2015-10-08 ENCOUNTER — Encounter (HOSPITAL_COMMUNITY): Payer: Self-pay

## 2015-10-08 DIAGNOSIS — Z933 Colostomy status: Secondary | ICD-10-CM

## 2015-10-08 DIAGNOSIS — R45851 Suicidal ideations: Secondary | ICD-10-CM | POA: Diagnosis present

## 2015-10-08 DIAGNOSIS — Z923 Personal history of irradiation: Secondary | ICD-10-CM | POA: Diagnosis not present

## 2015-10-08 DIAGNOSIS — Z809 Family history of malignant neoplasm, unspecified: Secondary | ICD-10-CM | POA: Diagnosis not present

## 2015-10-08 DIAGNOSIS — Z8249 Family history of ischemic heart disease and other diseases of the circulatory system: Secondary | ICD-10-CM

## 2015-10-08 DIAGNOSIS — F17210 Nicotine dependence, cigarettes, uncomplicated: Secondary | ICD-10-CM | POA: Diagnosis present

## 2015-10-08 DIAGNOSIS — G4700 Insomnia, unspecified: Secondary | ICD-10-CM | POA: Diagnosis present

## 2015-10-08 DIAGNOSIS — F332 Major depressive disorder, recurrent severe without psychotic features: Secondary | ICD-10-CM | POA: Diagnosis not present

## 2015-10-08 DIAGNOSIS — F1210 Cannabis abuse, uncomplicated: Secondary | ICD-10-CM

## 2015-10-08 DIAGNOSIS — C189 Malignant neoplasm of colon, unspecified: Secondary | ICD-10-CM | POA: Diagnosis present

## 2015-10-08 DIAGNOSIS — Z590 Homelessness: Secondary | ICD-10-CM

## 2015-10-08 DIAGNOSIS — Z85038 Personal history of other malignant neoplasm of large intestine: Secondary | ICD-10-CM | POA: Diagnosis not present

## 2015-10-08 DIAGNOSIS — F419 Anxiety disorder, unspecified: Secondary | ICD-10-CM | POA: Diagnosis present

## 2015-10-08 DIAGNOSIS — F1994 Other psychoactive substance use, unspecified with psychoactive substance-induced mood disorder: Secondary | ICD-10-CM | POA: Diagnosis not present

## 2015-10-08 DIAGNOSIS — Z9221 Personal history of antineoplastic chemotherapy: Secondary | ICD-10-CM

## 2015-10-08 DIAGNOSIS — F323 Major depressive disorder, single episode, severe with psychotic features: Secondary | ICD-10-CM | POA: Diagnosis present

## 2015-10-08 DIAGNOSIS — F1410 Cocaine abuse, uncomplicated: Secondary | ICD-10-CM

## 2015-10-08 DIAGNOSIS — Z9049 Acquired absence of other specified parts of digestive tract: Secondary | ICD-10-CM

## 2015-10-08 DIAGNOSIS — R7989 Other specified abnormal findings of blood chemistry: Secondary | ICD-10-CM

## 2015-10-08 MED ORDER — ACETAMINOPHEN 325 MG PO TABS
650.0000 mg | ORAL_TABLET | Freq: Four times a day (QID) | ORAL | Status: DC | PRN
  Administered 2015-10-08 – 2015-10-12 (×2): 650 mg via ORAL
  Filled 2015-10-08 (×2): qty 2

## 2015-10-08 MED ORDER — RISPERIDONE 1 MG PO TABS
1.0000 mg | ORAL_TABLET | Freq: Every day | ORAL | Status: DC
  Administered 2015-10-08 – 2015-10-09 (×2): 1 mg via ORAL
  Filled 2015-10-08 (×3): qty 1

## 2015-10-08 MED ORDER — TRAZODONE HCL 50 MG PO TABS
50.0000 mg | ORAL_TABLET | Freq: Every evening | ORAL | Status: DC | PRN
  Administered 2015-10-08 – 2015-10-09 (×2): 50 mg via ORAL
  Filled 2015-10-08 (×12): qty 1

## 2015-10-08 MED ORDER — FLUOXETINE HCL 20 MG PO CAPS
20.0000 mg | ORAL_CAPSULE | Freq: Every day | ORAL | Status: DC
Start: 2015-10-08 — End: 2015-10-13
  Administered 2015-10-08 – 2015-10-13 (×6): 20 mg via ORAL
  Filled 2015-10-08 (×9): qty 1

## 2015-10-08 MED ORDER — ALUM & MAG HYDROXIDE-SIMETH 200-200-20 MG/5ML PO SUSP
30.0000 mL | ORAL | Status: DC | PRN
  Administered 2015-10-11 – 2015-10-16 (×5): 30 mL via ORAL
  Filled 2015-10-08 (×5): qty 30

## 2015-10-08 MED ORDER — MAGNESIUM HYDROXIDE 400 MG/5ML PO SUSP: 30.0000 mL | Freq: Every day | ORAL | Status: DC | PRN

## 2015-10-08 MED ORDER — HYDROXYZINE HCL 25 MG PO TABS
25.0000 mg | ORAL_TABLET | Freq: Four times a day (QID) | ORAL | Status: DC | PRN
Start: 2015-10-08 — End: 2015-10-16
  Filled 2015-10-08: qty 10

## 2015-10-08 NOTE — Progress Notes (Signed)
DAR NOTE: Pt present with depressed mood and flat affect on the unit to day. Pt has been in the room most of the time. Pt also complained of 8/10 headache, pt stated he has not eaten for 4 days and that could be contributing to his headache. As per self inventory, pt had poor night sleep , good appetite, low energy and poor concentration. Pt rate depression at 10, hopelessness at 8, and anxiety at 10. Pt goal for today is to ' get well and get food, I haven't eat in 3-4 days.' Pt's safety ensured with 15 minute and environmental checks. Pt currently denies SI/HI and A/V hallucinations. Pt verbally agrees to seek staff if SI/HI or A/VH occurs and to consult with staff before acting on these thoughts. Will continue POC.

## 2015-10-08 NOTE — Progress Notes (Signed)
Adult Psychoeducational Group Note  Date:  10/08/2015 Time:  9:25 PM  Group Topic/Focus:  Wrap-Up Group:   The focus of this group is to help patients review their daily goal of treatment and discuss progress on daily workbooks.  Participation Level:  Active  Participation Quality:  Appropriate  Affect:  Appropriate  Cognitive:  Alert  Insight: Appropriate  Engagement in Group:  Engaged  Modes of Intervention:  Discussion  Additional Comments:  Patient stated having an okay day. Patient stated "I'm glad I was able to go to the cafeteria and eat." Something positive that happened today was "having an relaxed day."  Jeyden Coffelt L Dimple Bastyr 10/08/2015, 9:25 PM

## 2015-10-08 NOTE — Progress Notes (Signed)
Patient alert and oriented x 4. Patient denies pain/SI/HI/AVH at time of admission, but states, "I was having some SI earlier tonight." Patient main complaint is anxiety and his depression especially since having to get a colostomy. Patient states he sometime smokes week and does coke, and drinks 4-40oz beers. The last day using was Sunday. Patient reports he does not use everyday.  Skin assessment preformed, no areas of concern. Patient skin is dry, lotion was given with other hygiene products. Patient oriented to unit. All paperwork sign with this Probation officer.

## 2015-10-08 NOTE — Tx Team (Signed)
Initial Interdisciplinary Treatment Plan   PATIENT STRESSORS: Financial difficulties Health problems Loss of job and home.  Substance abuse   PATIENT STRENGTHS: Ability for insight Average or above average intelligence Capable of independent living Communication skills   PROBLEM LIST: Problem List/Patient Goals Date to be addressed Date deferred Reason deferred Estimated date of resolution  "Going through a lot" 10/08/2015     Anxiety 10/08/2015     Depression 10/08/2015                                          DISCHARGE CRITERIA:  Adequate post-discharge living arrangements Improved stabilization in mood, thinking, and/or behavior  PRELIMINARY DISCHARGE PLAN: Attend 12-step recovery group Outpatient therapy Placement in alternative living arrangements  PATIENT/FAMIILY INVOLVEMENT: This treatment plan has been presented to and reviewed with the patient, Richard [name].  The patient and family have been given the opportunity to ask questions and make suggestions.  Philomena Doheny 10/08/2015, 2:21 AM

## 2015-10-08 NOTE — BHH Suicide Risk Assessment (Signed)
[facility] INPATIENT:  Family/Significant Other Suicide Prevention Education  Suicide Prevention Education:  Patient Refusal for Family/Significant Other Suicide Prevention Education: The patient Richard [name] has refused to provide written consent for family/significant other to be provided Family/Significant Other Suicide Prevention Education during admission and/or prior to discharge.  Physician notified. SPE reviewed with patient and brochure provided. Patient encouraged to return to hospital if having suicidal thoughts, patient verbalized his/her understanding and has no further questions at this time.   Bo Mcclintock 10/08/2015, 4:59 PM

## 2015-10-08 NOTE — Consult Note (Signed)
WOC ostomy consult note Stoma type/location: LLQ colostomy.  Surgery date (according to patient) was about one year ago and in [place], [place] Stomal assessment/size: 7/8 inch x 1 and 1/8 inch oval. Minimally budded, red, moist Peristomal assessment: intact with maceration from 3-9 o'clock.  Patient has been cutting aperture too large. Treatment options for stomal/peristomal skin: skin barrier ring and cut to fit Output soft light brown stool Ostomy pouching: 1pc.with skin barrier ring.  Pouch is Kellie Simmering [phone] and Skin barrier ring is Kellie Simmering [phone] Education provided: Patient is taught the importance of cutting aperture to stoma size and shape.  He is independent in ostomy care, but has a flat affect.  Reports that pouching systems are lasting 3-4 days.  Encouraged to change twice weekly to avoid parastomal skin irritation. Independent in [place] closure. Patient indicates understanding.  One spare pouch and 5 spare skin barrier rings are left with nurse Opal Sidles at the [facility]. MD arrives for session immediately following pouch change and education session. Enrolled patient in [facility] program: No WOC nursing team will not follow, but will remain available to this patient, the nursing and medical teams.  Please re-consult if needed. Thanks, Maudie Flakes, MSN, RN, [place], Arther Abbott  Pager# [phone]

## 2015-10-08 NOTE — BHH Counselor (Signed)
Adult Comprehensive Assessment  Patient ID: Richard [name], male   DOB: 25-May-1966, 50 y.o.   MRN: [id number]  Information Source: Information source: Patient  Current Stressors:  Educational / Learning stressors: None reported Employment / Job issues: Pt currently on limited disability income Family Relationships: Supportive family, however Pt reports that he does not reach out to them often Museum/gallery curator / Lack of resources (include bankruptcy): Limited income- receives 400 in disabilty each month Housing / Lack of housing: Homeless currently. Has been living in a Publishing rights manager shop Physical health (include injuries & life threatening diseases): Hx of colon cancer- has colostomy bag Social relationships: Limited social support Substance abuse: Daily cocaine and marijuana use; uses ETOH when  Bereavement / Loss: None reported  Living/Environment/Situation:  Living Arrangements: Other (Comment) (has been living in a Publishing rights manager shop) Living conditions (as described by patient or guardian): uncomfortable How long has patient lived in current situation?: Unknown What is atmosphere in current home: Chaotic  Family History:  Marital status: Single Does patient have children?: Yes How many children?: 2 How is patient's relationship with their children?: good relationship with children; oldest son lives in [place] and is significant support  Childhood History:  By whom was/is the patient raised?: Both parents Description of patient's relationship with caregiver when they were a child: decent relationship Patient's description of current relationship with people who raised him/her: mother is still living and is supportive How were you disciplined when you got in trouble as a child/adolescent?: unknown Does patient have siblings?: Yes Number of Siblings: 4 Description of patient's current relationship with siblings: good relationship and they are supportive of patient Did patient  suffer any verbal/emotional/physical/sexual abuse as a child?: No Did patient suffer from severe childhood neglect?: No Has patient ever been sexually abused/assaulted/raped as an adolescent or adult?: No Was the patient ever a victim of a crime or a disaster?: No Witnessed domestic violence?: No Has patient been effected by domestic violence as an adult?: No  Education:  Highest grade of school patient has completed: Some college Currently a student?: No Learning disability?: No  Employment/Work Situation:   Employment situation: On disability Why is patient on disability: medical issues How long has patient been on disability: one year Patient's job has been impacted by current illness: No What is the longest time patient has a held a job?: Unknown Where was the patient employed at that time?: Unknown Has patient ever been in the [employer]?: No Has patient ever served in combat?: No Did You Receive Any Psychiatric Treatment/Services While in Passenger transport manager?: No Are There Guns or Other Weapons in [place]?: No  Financial Resources:   Museum/gallery curator resources: [employer], No income, Medicaid Does patient have a Programmer, applications or guardian?: No  Alcohol/Substance Abuse:   What has been your use of drugs/alcohol within the last 12 months?: ETOH, cocaine, and THC daily If attempted suicide, did drugs/alcohol play a role in this?: No Alcohol/Substance Abuse Treatment Hx: Past detox, Past Tx, Inpatient, Past Tx, Outpatient Has alcohol/substance abuse ever caused legal problems?: Yes  Social Support System:   Patient's Community Support System: Fair Dietitian Support System: family is supportive, however he does not want to burden them Type of faith/religion: christian How does patient's faith help to cope with current illness?: Pt did not state  Leisure/Recreation:   Leisure and Hobbies: Unknown  Strengths/Needs:   What things does the patient do well?: working on  cars In what areas does patient struggle / problems  for patient: managing social issues  Discharge Plan:   Does patient have access to transportation?: No Plan for no access to transportation at discharge: public transit Will patient be returning to same living situation after discharge?: No Plan for living situation after discharge: Pt requesting housing resources Currently receiving community mental health services: No If no, would patient like referral for services when discharged?: Yes (What county?) (Considering treatment at [facility] or [facility] and utilizing TCT)  Summary/Recommendations:     Patient is a 50 year old male with a diagnosis of Major Depressive Disorder, recurrent, severe and Stimulant Use Disorder, severe. Pt presented to the hospital with thoughts of suicide and increased depression. Pt reports primary trigger(s) for admission was homelessness. Patient will benefit from crisis stabilization, medication evaluation, group therapy and psycho education in addition to case management for discharge planning. At discharge it is recommended that Pt remain compliant with established discharge plan and continued treatment.    Bo Mcclintock. 10/08/2015

## 2015-10-08 NOTE — BHH Group Notes (Signed)
[facility] Group Notes:  (Nursing/MHT/Case Management/Adjunct)  Date:  10/08/2015  Time:  10:26 AM  Type of Therapy:  Psychoeducational Skills  Participation Level:  Did Not Attend  Participation Quality:  DID NOT ATTEND  Affect:  DID NOT ATTEND  Cognitive:  DID NOT ATTEND  Insight:  None  Engagement in Group:  DID NOT ATTEND  Modes of Intervention:  DID NOT ATTEND  Summary of Progress/Problems: Pt did not attend patient self inventory group.  Wolfgang Phoenix 10/08/2015, 10:26 AM

## 2015-10-08 NOTE — BHH Suicide Risk Assessment (Signed)
[facility] Admission Suicide Risk Assessment   Nursing information obtained from:   patient and chart Demographic factors:   50 year old single male, currently on disability, homeless Current Mental Status:   see below  Loss Factors:   on disability, history of colon cancer,S/P colostomy Historical Factors:   depression, substance abuse  Risk Reduction Factors:   resilience, sense of responsibility to family  Total Time spent with patient: 45 minutes Principal Problem:  Major Depression, Recurrent, Severe, with Psychotic symptoms, Cocaine and Cannabis Abuse  Diagnosis:   Patient Active Problem List   Diagnosis Date Noted  . Substance induced mood disorder ([place]) [F19.94] 10/08/2015  . Colostomy complication ([place]) [id number]   . Colon cancer s/p chemo + radiation and resection. Now with colostomy bag [C18.9] 04/10/2015  . SVT (supraventricular tachycardia) ([place]) [I47.1] 04/10/2015  . Tobacco abuse [Z72.0] 04/10/2015     Continued Clinical Symptoms:  Alcohol Use Disorder Identification Test Final Score (AUDIT): 8 The "Alcohol Use Disorders Identification Test", Guidelines for Use in Primary Care, Second Edition.  World Pharmacologist [facility]). Score between 0-7:  no or low risk or alcohol related problems. Score between 8-15:  moderate risk of alcohol related problems. Score between 16-19:  high risk of alcohol related problems. Score 20 or above:  warrants further diagnostic evaluation for alcohol dependence and treatment.   CLINICAL FACTORS:  50 year old man, presenting with worsening depression, passive SI, and vague auditory hallucinations. Colon cancer / colostomy a chronic stressor. Reports alcohol abuse- last drank a few days ago and not presenting with any withdrawal symptoms at this time, and cocaine, cannabis abuse.      Psychiatric Specialty Exam: ROS  Blood pressure 113/86, pulse 59, temperature 97.8 F (36.6 C), temperature source Oral, resp. rate 18, height 5' 7.25"  (1.708 m), weight 237 lb (107.502 kg), SpO2 98 %.Body mass index is 36.85 kg/(m^2).   see admit note MSE  COGNITIVE FEATURES THAT CONTRIBUTE TO RISK:  Closed-mindedness and Loss of executive function    SUICIDE RISK:   Moderate:  Frequent suicidal ideation with limited intensity, and duration, some specificity in terms of plans, no associated intent, good self-control, limited dysphoria/symptomatology, some risk factors present, and identifiable protective factors, including available and accessible social support.  PLAN OF CARE: Patient will be admitted to inpatient psychiatric unit for stabilization and safety. Will provide and encourage milieu participation. Provide medication management and maked adjustments as needed.  Will follow daily.    I certify that inpatient services furnished can reasonably be expected to improve the patient's condition.   Neita Garnet, MD 10/08/2015, 12:25 PM

## 2015-10-08 NOTE — Tx Team (Signed)
Interdisciplinary Treatment Plan Update (Adult) Date: 10/08/2015   Date: 10/08/2015 11:49 AM  Progress in Treatment:  Attending groups: Pt is new to milieu, continuing to assess   Participating in groups: Pt is new to milieu, continuing to assess  Taking medication as prescribed: Yes  Tolerating medication: Yes  Family/Significant othe contact made: No, CSW assessing for appropriate contact Patient understands diagnosis: Continuing to assess Discussing patient identified problems/goals with staff: Yes  Medical problems stabilized or resolved: Yes  Denies suicidal/homicidal ideation: No, recently admitted with SI Patient has not harmed self or Others: Yes   New problem(s) identified: None identified at this time.   Discharge Plan or Barriers: CSW will assess for appropriate discharge plan and relevant barriers.   Additional comments:  Patient and CSW reviewed pt's identified goals and treatment plan. Patient verbalized understanding and agreed to treatment plan. CSW reviewed [facility] "Discharge Process and Patient Involvement" Form. Pt verbalized understanding of information provided and signed form.   Reason for Continuation of Hospitalization:  Depression Medication stabilization Suicidal ideation  Estimated length of stay: 3-5 days  Review of initial/current patient goals per problem list:   1.  Goal(s): Patient will participate in aftercare plan  Met:  No  Target date: 3-5 days from date of admission   As evidenced by: Patient will participate within aftercare plan AEB aftercare provider and housing plan at discharge being identified.  10/08/15: CSW to work with Pt to assess for appropriate discharge plan and faciliate appointments and referrals as needed prior to d/c.  2.  Goal (s): Patient will exhibit decreased depressive symptoms and suicidal ideations.  Met:  No  Target date: 3-5 days from date of admission   As evidenced by: Patient will utilize self rating of  depression at 3 or below and demonstrate decreased signs of depression or be deemed stable for discharge by MD. 10/08/15: Pt was admitted with symptoms of depression, rating 10/10. Pt continues to present with flat affect and depressive symptoms.  Pt will demonstrate decreased symptoms of depression and rate depression at 3/10 or lower prior to discharge.  Attendees:  Patient:    Family:    Physician: Dr. Parke Poisson, MD  10/08/2015 11:49 AM  Nursing:   10/08/2015 11:49 AM  Clinical Social Worker Peri Maris, [place] 10/08/2015 11:49 AM  Other: Tilden Fossa, [place] 10/08/2015 11:49 AM  Clinical: Eulogio Bear, RN 10/08/2015 11:49 AM  Other: , RN Charge Nurse 10/08/2015 11:49 AM  Other:     Peri Maris, [place] Social Work [phone]
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
## 2015-10-08 NOTE — B[facility] Group Notes (Signed)
[facility] LCSW Group Therapy 10/08/2015 1:15 PM  Type of Therapy: Group Therapy- Feelings about Diagnosis  Participation Level: Minimal  Participation Quality:  N/A- did not participate  Affect:  Lethargic  Cognitive: Alert and Oriented   Insight:  Developing   Engagement in Therapy: Limited  Modes of Intervention: Clarification, Confrontation, Discussion, Education, Exploration, Limit-setting, Orientation, Problem-solving, Rapport Building, Art therapist, Socialization and Support  Description of Group:   This group will allow patients to explore their thoughts and feelings about diagnoses they have received. Patients will be guided to explore their level of understanding and acceptance of these diagnoses. Facilitator will encourage patients to process their thoughts and feelings about the reactions of others to their diagnosis, and will guide patients in identifying ways to discuss their diagnosis with significant others in their lives. This group will be process-oriented, with patients participating in exploration of their own experiences as well as giving and receiving support and challenge from other group members.  Summary of Progress/Problems:  Pt did not participate in group discussion, however did attempt to be attentive.  Therapeutic Modalities:   Cognitive Behavioral Therapy Solution Focused Therapy Motivational Interviewing Relapse Prevention Therapy  Peri Maris, LCSWA 10/08/2015 4:58 PM

## 2015-10-08 NOTE — ED Notes (Signed)
[facility] [employer] called @ [phone].

## 2015-10-09 MED ORDER — NICOTINE 21 MG/24HR TD PT24
21.0000 mg | MEDICATED_PATCH | Freq: Every day | TRANSDERMAL | Status: DC
  Administered 2015-10-09 – 2015-10-15 (×2): 21 mg via TRANSDERMAL
  Filled 2015-10-09 (×11): qty 1

## 2015-10-09 NOTE — BHH Group Notes (Signed)
[facility] LCSW Group Therapy 10/09/2015 1:15 PM  Type of Therapy: Group Therapy- Emotion Regulation  Participation Level: Active   Participation Quality:  Appropriate  Affect: Appropriate  Cognitive: Alert and Oriented   Insight:  Developing/Improving  Engagement in Therapy: Developing/Improving and Engaged   Modes of Intervention: Clarification, Confrontation, Discussion, Education, Exploration, Limit-setting, Orientation, Problem-solving, Rapport Building, Art therapist, Socialization and Support  Summary of Progress/Problems: The topic for group today was emotional regulation. This group focused on both positive and negative emotion identification and allowed group members to process ways to identify feelings, regulate negative emotions, and find healthy ways to manage internal/external emotions. Group members were asked to reflect on a time when their reaction to an emotion led to a negative outcome and explored how alternative responses using emotion regulation would have benefited them. Group members were also asked to discuss a time when emotion regulation was utilized when a negative emotion was experienced. Pt participated in group discussion, however when he began to participate could monopolize conversation at times. He expresses that he wishes that he had not been exposed to certain situations in life and ruminates on the desire to "get back where I used to be" in terms of finances, housing, and health.   Peri Maris, LCSWA 10/09/2015 4:16 PM

## 2015-10-09 NOTE — Progress Notes (Signed)
D-  Patient reported sleeping well last night, as he has not been sleeping well for the past 3-4 nights prior.  Patient received trazodone and slept through the night.  Patient denies SI. HI and AVH.  Patient reports anxiety being an 8/10 and patient complains of pain 8/10 that is unresolved by any means and refused pain medication because he reports it does not work. Patient refused groups due to back pain.  A- assess patient for safety, offer medications as prescribed, engage in 1:1 therapeutic talks, encourage patient to maintain sobriety.   R-  Patient was able to contract for safety.

## 2015-10-09 NOTE — Progress Notes (Signed)
[facility] MD Progress Note  10/09/2015 1:06 PM Richard [name]  MRN:  [id number] Subjective:  Patient reports some improvement but continues to report depression. Denies suicidal ideations and states hallucinations have resolved. Denies medication side effects. Describes chronic lower back /hip pain. Objective : I have discussed case with treatment team and have met with patient . Patient reports partial improvement compared to admission and as noted, at this time denies any ongoing hallucinations , and does not appear internally preoccupied . Remains depressed, constricted in affect. Currently not presenting with any significant withdrawal symptoms. Denies medication side effects. He is future oriented, and is focused on " trying to get help from the social worker so I can start turning my life around " No disruptive or agitated behaviors on unit.   Principal Problem:  Major Depression, Recurrent- Cocaine Abuse  Diagnosis:   Patient Active Problem List   Diagnosis Date Noted  . Substance induced mood disorder ([place]) [F19.94] 10/08/2015  . 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 Colon cancer s/p chemo + radiation and resection. Now with colostomy bag [C18.9] 04/10/2015  . SVT (supraventricular tachycardia) ([place]) [I47.1] 04/10/2015  . Tobacco abuse [Z72.0] 04/10/2015   Total Time spent with patient: 20 minutes    Past Medical History:  Past Medical History  Diagnosis Date  . Colon cancer ([place])   . Colon cancer ([place]) 2015  . Colostomy complication ([place]) 64-40-34    blood in colostomy bag    Past Surgical History  Procedure Laterality Date  . Partial colectomy  06/2014    done at [facility]   Family History:  Family History  Problem Relation Age of Onset  . Coronary artery disease Cousin 43    Social History:  History  Alcohol Use  . 0.6 oz/week  . 1 Cans of beer per week    Comment: drinks 2 times a week     History  Drug Use  . Yes  . Special: Marijuana, Cocaine   Comment: Lst used 3 days ago    Social History   Social History  . Marital Status: Single    Spouse Name: N/A  . Number of Children: N/A  . Years of Education: N/A   Social History Main Topics  . Smoking status: Current Every Day Smoker -- 0.50 packs/day    Types: Cigarettes  . Smokeless tobacco: Never Used  . Alcohol Use: 0.6 oz/week    1 Cans of beer per week     Comment: drinks 2 times a week  . Drug Use: Yes    Special: Marijuana, Cocaine     Comment: Lst used 3 days ago  . Sexual Activity: No   Other Topics Concern  . None   Social History Narrative   Additional Social History:    Pain Medications: Denies abuse Prescriptions: Denies abuse Over the Counter: Denies abuse History of alcohol / drug use?: Yes Longest period of sobriety (when/how long): Unknown Negative Consequences of Use: Financial, Personal relationships, Work / School Name of Substance 1: Alcohol 1 - Age of First Use: Adolescent 1 - Amount (size/oz): 6-12 cans of beer 1 - Frequency: Daily when available 1 - Duration: Ongoing for years 1 - Last Use / Amount: 10/06/15, six beers Name of Substance 2: Cocaine 2 - Age of First Use: 22 2 - Amount (size/oz): Varies 2 - Frequency: Daily when available 2 - Duration: Ongoing for years 2 - Last Use / Amount: 10/06/15, $20 worth Name of Substance 3: Marijuana 3 -  Age of First Use: Adolescent 3 - Amount (size/oz): varies 3 - Frequency: Daily when available 3 - Duration: Ongoing for years 3 - Last Use / Amount: 10/06/15  Sleep: improved   Appetite:  improved  Current Medications: Current Facility-Administered Medications  Medication Dose Route Frequency Provider Last Rate Last Dose  . acetaminophen (TYLENOL) tablet 650 mg  650 mg Oral Q6H PRN Laverle Hobby, PA-C   650 mg at 10/08/15 8119  . alum & mag hydroxide-simeth (MAALOX/MYLANTA) 200-200-20 MG/5ML suspension 30 mL  30 mL Oral Q4H PRN Laverle Hobby, PA-C      . FLUoxetine (PROZAC) capsule  20 mg  20 mg Oral Daily Jenne Campus, MD   20 mg at 10/09/15 0802  . hydrOXYzine (ATARAX/VISTARIL) tablet 25 mg  25 mg Oral Q6H PRN Laverle Hobby, PA-C      . magnesium hydroxide (MILK OF MAGNESIA) suspension 30 mL  30 mL Oral Daily PRN Laverle Hobby, PA-C      . risperiDONE (RISPERDAL) tablet 1 mg  1 mg Oral QHS Jenne Campus, MD   1 mg at 10/08/15 2237  . traZODone (DESYREL) tablet 50 mg  50 mg Oral QHS,MR X 1 Laverle Hobby, PA-C   50 mg at 10/08/15 2237    Lab Results:  Results for orders placed or performed during the hospital encounter of 10/07/15 (from the past 48 hour(s))  Comprehensive metabolic panel     Status: Abnormal   Collection Time: 10/07/15  1:22 PM  Result Value Ref Range   Sodium 140 135 - 145 mmol/L   Potassium 3.6 3.5 - 5.1 mmol/L   Chloride 107 101 - 111 mmol/L   CO2 24 22 - 32 mmol/L   Glucose, Bld 125 (H) 65 - 99 mg/dL   BUN 6 6 - 20 mg/dL   Creatinine, Ser 1.23 0.61 - 1.24 mg/dL   Calcium 9.1 8.9 - 10.3 mg/dL   Total Protein 6.9 6.5 - 8.1 g/dL   Albumin 3.7 3.5 - 5.0 g/dL   AST 23 15 - 41 U/L   ALT 23 17 - 63 U/L   Alkaline Phosphatase 67 38 - 126 U/L   Total Bilirubin 0.7 0.3 - 1.2 mg/dL   GFR calc non Af Amer >60 >60 mL/min   GFR calc Af Amer >60 >60 mL/min    Comment: (NOTE) The eGFR has been calculated using the CKD EPI equation. This calculation has not been validated in all clinical situations. eGFR's persistently <60 mL/min signify possible Chronic Kidney Disease.    Anion gap 9 5 - 15  CBC     Status: Abnormal   Collection Time: 10/07/15  1:22 PM  Result Value Ref Range   WBC 3.0 (L) 4.0 - 10.5 K/uL   RBC 4.52 4.22 - 5.81 MIL/uL   Hemoglobin 13.7 13.0 - 17.0 g/dL   HCT 40.8 39.0 - 52.0 %   MCV 90.3 78.0 - 100.0 fL   MCH 30.3 26.0 - 34.0 pg   MCHC 33.6 30.0 - 36.0 g/dL   RDW 16.1 (H) 11.5 - 15.5 %   Platelets 239 150 - 400 K/uL  Ethanol (ETOH)     Status: None   Collection Time: 10/07/15  1:23 PM  Result Value Ref Range    Alcohol, Ethyl (B) <5 <5 mg/dL    Comment:        LOWEST DETECTABLE LIMIT FOR SERUM ALCOHOL IS 5 mg/dL FOR MEDICAL PURPOSES ONLY   Salicylate level  Status: None   Collection Time: 10/07/15  1:23 PM  Result Value Ref Range   Salicylate Lvl <3.2 2.8 - 30.0 mg/dL  Acetaminophen level     Status: Abnormal   Collection Time: 10/07/15  1:23 PM  Result Value Ref Range   Acetaminophen (Tylenol), Serum <10 (L) 10 - 30 ug/mL    Comment:        THERAPEUTIC CONCENTRATIONS VARY SIGNIFICANTLY. A RANGE OF 10-30 ug/mL MAY BE AN EFFECTIVE CONCENTRATION FOR MANY PATIENTS. HOWEVER, SOME ARE BEST TREATED AT CONCENTRATIONS OUTSIDE THIS RANGE. ACETAMINOPHEN CONCENTRATIONS >150 ug/mL AT 4 HOURS AFTER INGESTION AND >50 ug/mL AT 12 HOURS AFTER INGESTION ARE OFTEN ASSOCIATED WITH TOXIC REACTIONS.   Urine rapid drug screen (hosp performed) (Not at [facility])     Status: Abnormal   Collection Time: 10/07/15  9:10 PM  Result Value Ref Range   Opiates NONE DETECTED NONE DETECTED   Cocaine POSITIVE (A) NONE DETECTED   Benzodiazepines NONE DETECTED NONE DETECTED   Amphetamines NONE DETECTED NONE DETECTED   Tetrahydrocannabinol POSITIVE (A) NONE DETECTED   Barbiturates NONE DETECTED NONE DETECTED    Comment:        DRUG SCREEN FOR MEDICAL PURPOSES ONLY.  IF CONFIRMATION IS NEEDED FOR ANY PURPOSE, NOTIFY LAB WITHIN 5 DAYS.        LOWEST DETECTABLE LIMITS FOR URINE DRUG SCREEN Drug Class       Cutoff (ng/mL) Amphetamine      1000 Barbiturate      200 Benzodiazepine   355 Tricyclics       732 Opiates          300 Cocaine          300 THC              50     Physical Findings: AIMS: Facial and Oral Movements Muscles of Facial Expression: None, normal Lips and Perioral Area: None, normal Jaw: None, normal Tongue: None, normal,Extremity Movements Upper (arms, wrists, hands, fingers): None, normal Lower (legs, knees, ankles, toes): None, normal, Trunk Movements Neck, shoulders, hips: None,  normal, Overall Severity Severity of abnormal movements (highest score from questions above): None, normal Incapacitation due to abnormal movements: None, normal Patient's awareness of abnormal movements (rate only patient's report): No Awareness, Dental Status Current problems with teeth and/or dentures?: No Does patient usually wear dentures?: No  CIWA:  CIWA-Ar Total: 0 COWS:  COWS Total Score: 0  Musculoskeletal: Strength & Muscle Tone: within normal limits  No tremors , no diaphoresis Gait & Station: normal- walks slowly due to some hip/ lower back  pain Patient leans: N/A  Psychiatric Specialty Exam: ROS no chest pain, no SOB, no rash,  Blood pressure 133/88, pulse 80, temperature 97.9 F (36.6 C), temperature source Oral, resp. rate 20, height 5' 7.25" (1.708 m), weight 237 lb (107.502 kg), SpO2 98 %.Body mass index is 36.85 kg/(m^2).  General Appearance: Fairly Groomed  Engineer, water::  Good  Speech:  Normal Rate  Volume:  Decreased  Mood:  Depressed  Affect:  Constricted  Thought Process:  Linear  Orientation:  Full (Time, Place, and Person)  Thought Content:  denies hallucinations, no delusions- as above, states hallucinations have resolved- does not appear internally preoccupied at this time  Suicidal Thoughts:   denies any suicidal plans or intention at this time . Contracts for safety on the unit.  Homicidal Thoughts:  No  Memory:  recent and remote grossly intact   Judgement:  Fair  Insight:  Present  Psychomotor Activity:  Decreased  Concentration:  Good  Recall:  Good  Fund of Knowledge:Good  Language: Good  Akathisia:  Negative  Handed:  Right  AIMS (if indicated):     Assets:  Desire for Improvement Resilience  ADL's:  Intact  Cognition: WNL  Sleep:  Number of Hours: 6.5  Assessment - patient presents depressed, constricted, but endorsing some improvement compared to admission- hallucinations he had reported have now resolved . Although still depressed,  denies any SI at this time and is becoming more future oriented, and focusing more on disposition options. At this time tolerating medications well. He is not currently presenting with WDL symptoms and vitals are stable . Treatment Plan Summary: Daily contact with patient to assess and evaluate symptoms and progress in treatment, Medication management, Plan inpatient treatment  and medications as below   1. Encourage group, milieu participation to work on coping skills and symptom reduction 2. Continue to work on early recovery and relapse prevention efforts  3. Prozac 20 mgrs QDAY for depression  4. Continue Risperidone 1 mgrs QHS  For psychotic symptoms- would not increase dose at this time because hallucinations have resolved at this time. 5. Continue Trazodone 50 mgrs QHS PRN for insomnia as needed  Treatment team working on disposition planning options    COBOS, [place] 10/09/2015, 1:06 PM

## 2015-10-09 NOTE — Progress Notes (Signed)
D. Pt had been up and visible in milieu this evening, did attend and participate in evening group activity. Pt has appeared depressed this evening, pt also spoke about how he has not been sleeping well and is hopeful for a good night sleep tonight. Pt did receive all medications without incident. A. Support provided, medication education given. R. Pt verbalized understanding, safety maintained.

## 2015-10-10 LAB — CBC WITH DIFFERENTIAL/PLATELET
BASOS ABS: 0 10*3/uL (ref 0.0–0.1)
BASOS PCT: 0 %
Eosinophils Absolute: 0.2 10*3/uL (ref 0.0–0.7)
Eosinophils Relative: 5 %
HEMATOCRIT: 39.6 % (ref 39.0–52.0)
HEMOGLOBIN: 13.1 g/dL (ref 13.0–17.0)
Lymphocytes Relative: 24 %
Lymphs Abs: 1.1 10*3/uL (ref 0.7–4.0)
MCH: 30 pg (ref 26.0–34.0)
MCHC: 33.1 g/dL (ref 30.0–36.0)
MCV: 90.8 fL (ref 78.0–100.0)
Monocytes Absolute: 0.4 10*3/uL (ref 0.1–1.0)
Monocytes Relative: 8 %
NEUTROS ABS: 3 10*3/uL (ref 1.7–7.7)
NEUTROS PCT: 63 %
Platelets: 260 10*3/uL (ref 150–400)
RBC: 4.36 MIL/uL (ref 4.22–5.81)
RDW: 15.8 % — AB (ref 11.5–15.5)
WBC: 4.6 10*3/uL (ref 4.0–10.5)

## 2015-10-10 MED ORDER — RISPERIDONE 2 MG PO TABS
2.0000 mg | ORAL_TABLET | Freq: Every day | ORAL | Status: DC
  Filled 2015-10-10: qty 1

## 2015-10-10 MED ORDER — ARIPIPRAZOLE 5 MG PO TABS
5.0000 mg | ORAL_TABLET | Freq: Every day | ORAL | Status: DC
  Administered 2015-10-10 – 2015-10-16 (×7): 5 mg via ORAL
  Filled 2015-10-10 (×9): qty 1

## 2015-10-10 NOTE — Progress Notes (Addendum)
Patient ID: Williamson Cavanah, male   DOB: [birth year], 50 y.o.   MRN: [id number] [facility] MD Progress Note  10/10/2015 3:09 PM Dodger Sinning  MRN:  [id number] Subjective:  Patient  States he is still feeling " kind of depressed", and reports that he has again started " hearing that gibberish", referring to auditory hallucinations- he states he cannot make out what is said . Denies medication side effects. Objective : I have discussed case with treatment team and have met with patient . Patient presents depressed, with constricted affect- denies suicidal ideations. As noted, endorses some auditory hallucinations, although does not appear internally preoccupied . He is visible in day room, but has limited interactions with peers and tends to keep to self . Denies medication side effects. Of note,  Patient had reported bilateral chest pain on admission and had a CT angio which was negative for PE.  At this time no chest pain or SOB, and patient denies any current lower extremity edema or pain- chart notes indicate he had been referred for lower extremity ultrasound . Will discuss with hospitalist as patient currently not symptomatic and recent negative CT .  No disruptive or agitated behaviors on unit.   Principal Problem:  Major Depression, Recurrent- Cocaine Abuse  Diagnosis:   Patient Active Problem List   Diagnosis Date Noted  . Substance induced mood disorder ([place]) [F19.94] 10/08/2015  . 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 Colon cancer s/p chemo + radiation and resection. Now with colostomy bag [C18.9] 04/10/2015  . SVT (supraventricular tachycardia) ([place]) [I47.1] 04/10/2015  . Tobacco abuse [Z72.0] 04/10/2015   Total Time spent with patient: 20 minutes    Past Medical History:  Past Medical History  Diagnosis Date  . Colon cancer ([place])   . Colon cancer ([place]) 2015  . Colostomy complication ([place]) 06-30-50    blood in colostomy bag    Past Surgical History  Procedure Laterality Date  .  Partial colectomy  06/2014    done at [facility]   Family History:  Family History  Problem Relation Age of Onset  . Coronary artery disease Cousin 11    Social History:  History  Alcohol Use  . 0.6 oz/week  . 1 Cans of beer per week    Comment: drinks 2 times a week     History  Drug Use  . Yes  . Special: Marijuana, Cocaine    Comment: Lst used 3 days ago    Social History   Social History  . Marital Status: Single    Spouse Name: N/A  . Number of Children: N/A  . Years of Education: N/A   Social History Main Topics  . Smoking status: Current Every Day Smoker -- 0.50 packs/day    Types: Cigarettes  . Smokeless tobacco: Never Used  . Alcohol Use: 0.6 oz/week    1 Cans of beer per week     Comment: drinks 2 times a week  . Drug Use: Yes    Special: Marijuana, Cocaine     Comment: Lst used 3 days ago  . Sexual Activity: No   Other Topics Concern  . None   Social History Narrative   Additional Social History:    Pain Medications: Denies abuse Prescriptions: Denies abuse Over the Counter: Denies abuse History of alcohol / drug use?: Yes Longest period of sobriety (when/how long): Unknown Negative Consequences of Use: Financial, Personal relationships, Work / School Name of Substance 1: Alcohol 1 - Age of First Use: Adolescent  1 - Amount (size/oz): 6-12 cans of beer 1 - Frequency: Daily when available 1 - Duration: Ongoing for years 1 - Last Use / Amount: 10/06/15, six beers Name of Substance 2: Cocaine 2 - Age of First Use: 22 2 - Amount (size/oz): Varies 2 - Frequency: Daily when available 2 - Duration: Ongoing for years 2 - Last Use / Amount: 10/06/15, $20 worth Name of Substance 3: Marijuana 3 - Age of First Use: Adolescent 3 - Amount (size/oz): varies 3 - Frequency: Daily when available 3 - Duration: Ongoing for years 3 - Last Use / Amount: 10/06/15  Sleep: improved   Appetite:  improved  Current Medications: Current Facility-Administered  Medications  Medication Dose Route Frequency Provider Last Rate Last Dose  . acetaminophen (TYLENOL) tablet 650 mg  650 mg Oral Q6H PRN Laverle Hobby, PA-C   650 mg at 10/08/15 5732  . alum & mag hydroxide-simeth (MAALOX/MYLANTA) 200-200-20 MG/5ML suspension 30 mL  30 mL Oral Q4H PRN Laverle Hobby, PA-C      . ARIPiprazole (ABILIFY) tablet 5 mg  5 mg Oral Daily Myer Peer Cobos, MD      . FLUoxetine (PROZAC) capsule 20 mg  20 mg Oral Daily Jenne Campus, MD   20 mg at 10/10/15 2025  . hydrOXYzine (ATARAX/VISTARIL) tablet 25 mg  25 mg Oral Q6H PRN Laverle Hobby, PA-C      . magnesium hydroxide (MILK OF MAGNESIA) suspension 30 mL  30 mL Oral Daily PRN Laverle Hobby, PA-C      . nicotine (NICODERM CQ - dosed in mg/24 hours) patch 21 mg  21 mg Transdermal Daily Jenne Campus, MD   21 mg at 10/09/15 1813  . traZODone (DESYREL) tablet 50 mg  50 mg Oral QHS,MR X 1 Laverle Hobby, PA-C   50 mg at 10/09/15 2239    Lab Results:  No results found for this or any previous visit (from the past 48 hour(s)).  Physical Findings: AIMS: Facial and Oral Movements Muscles of Facial Expression: None, normal Lips and Perioral Area: None, normal Jaw: None, normal Tongue: None, normal,Extremity Movements Upper (arms, wrists, hands, fingers): None, normal Lower (legs, knees, ankles, toes): None, normal, Trunk Movements Neck, shoulders, hips: None, normal, Overall Severity Severity of abnormal movements (highest score from questions above): None, normal Incapacitation due to abnormal movements: None, normal Patient's awareness of abnormal movements (rate only patient's report): No Awareness, Dental Status Current problems with teeth and/or dentures?: No Does patient usually wear dentures?: No  CIWA:  CIWA-Ar Total: 0 COWS:  COWS Total Score: 0  Musculoskeletal: Strength & Muscle Tone: within normal limits  No tremors , no diaphoresis Gait & Station: normal- walks slowly due to some hip/ lower  back  pain Patient leans: N/A  Psychiatric Specialty Exam: ROS no chest pain, no SOB, no rash, no lower extremity edema or pain endorsed at this time  Blood pressure 122/91, pulse 72, temperature 98.1 F (36.7 C), temperature source Oral, resp. rate 20, height 5' 7.25" (1.708 m), weight 237 lb (107.502 kg), SpO2 98 %.Body mass index is 36.85 kg/(m^2).  General Appearance: Fairly Groomed  Engineer, water::  Good  Speech:  Normal Rate  Volume:  Decreased  Mood:   Remains sad , depressed  Affect:  Constricted  Thought Process:  Linear  Orientation:  Full (Time, Place, and Person)  Thought Content:   Describes vague auditory hallucinations, as above. Does not appear internally preoccupied, no delusions are expressed .  Suicidal Thoughts:   denies any suicidal plans or intention at this time . Contracts for safety on the unit.  Homicidal Thoughts:  No  Memory:  recent and remote grossly intact   Judgement:  Fair  Insight:  Present  Psychomotor Activity:  Decreased  Concentration:  Good  Recall:  Good  Fund of Knowledge:Good  Language: Good  Akathisia:  Negative  Handed:  Right  AIMS (if indicated):     Assets:  Desire for Improvement Resilience  ADL's:  Intact  Cognition: WNL  Sleep:  Number of Hours: 6.75  Assessment - patient remains depressed, sad, and endorsing some ongoing auditory hallucinations, although does not appear internally preoccupied at present.  Denies any suicidal ideations at this time. Tolerating medications well thus far. Due to slightly elevated QTc and history of cardiac issues  ( SVT reported in the past ) have discussed changing antipsychotic to Abilify, rather than titrating Risperidone .   Treatment Plan Summary: Daily contact with patient to assess and evaluate symptoms and progress in treatment, Medication management, Plan inpatient treatment  and medications as below   1. Encourage group, milieu participation to work on coping skills and symptom  reduction 2. Continue to work on early recovery and relapse prevention efforts  3. Prozac 20 mgrs QDAY for depression  4. D/C Risperidone  5. Start Abilify 5 mgrs QDAY for mood disorder and psychotic symptoms 6. Continue Trazodone 50 mgrs QHS PRN for insomnia as needed  Will recheck CBC to follow up on decreased WBC  Treatment team working on disposition planning options    COBOS, [place] 10/10/2015, 3:09 PM

## 2015-10-10 NOTE — Progress Notes (Signed)
[place] Group Notes:  (Nursing/MHT/Case Management/Adjunct)  Date:  10/10/2015  Time: 2100 Type of Therapy:  wrap up group  Participation Level:  Active  Participation Quality:  Appropriate, Attentive, Sharing and Supportive  Affect:  Blunted  Cognitive:  Lacking  Insight:  Improving  Engagement in Group:  Supportive  Modes of Intervention:  Clarification, Education and Support  Summary of Progress/Problems: Pt shared that he had a lot of stress and felt overwhelmed before coming to the hospital. Pt reported feeling relaxed today and wants to get to a place where he feels in control again. Pt enjoys working on his motorcycle.   Jacques Navy 10/10/2015, 9:48 PM

## 2015-10-10 NOTE — [facility] Group Notes (Signed)
[place] Group Notes:  (Nursing/MHT/Case Management/Adjunct)  Date:  10/10/2015  Time: 0900 am  Type of Therapy:  Psychoeducational Skills  Participation Level:  Did Not Attend Invited to attend; declined.  Zipporah Plants 10/10/2015, 10:40 AM

## 2015-10-10 NOTE — Progress Notes (Signed)
D: Pt is alert and oriented x4. Pt endorses mild anxiety and depression; he states, "there is always something going on in my mind; feeling like this is normal to me." Pt denies pain, AVH, SI, and HI. Although Pt remained in the dayroom he practically kept to himself. Pt remained calm, cooperative, and nonviolent through the shift assessment. A: Medications offered as prescribed.  Support, encouragement, and safe environment provided.  15-minute safety checks continue. R: Pt was med compliant.  Pt attended wrap-up group. Safety checks continue

## 2015-10-10 NOTE — B[facility] Group Notes (Signed)
[place] LCSW Group Therapy 10/10/2015 1:15pm  Type of Therapy: Group Therapy- Balance in Life  Participation Level: Minimal  Description of the Group:  The topic for group was balance in life. Today's group focused on defining balance in one's own words, identifying things that can knock one off balance, and exploring healthy ways to maintain balance in life. Group members were asked to provide an example of a time when they felt off balance, describe how they handled that situation,and process healthier ways to regain balance in the future. Group members were asked to share the most important tool for maintaining balance that they learned while at [facility] and how they plan to apply this method after discharge.  Summary of Patient Progress Pt identified stability and independence as the definition of balance. Pt identified a desire to work for himself and have the means to provide for himself. Pt continues to appear lethargic in group.    Therapeutic Modalities:   Cognitive Behavioral Therapy Solution-Focused Therapy Assertiveness Training   Peri Maris, Latanya Presser 10/10/2015 3:39 PM

## 2015-10-10 NOTE — Progress Notes (Signed)
D-  Patient has spent the majority of the shift in his room.  Patient reports 8/10 unresolved back pain.  Patient denied pain medication stating that it has no effect.  Patient reports increased anxiety, depression, and hopelessness.  Patient endorses SI but does not have a plan to harm self at this point.  Patient stated that he is able to come to staff if he begins to feel increasingly unsafe.    A-  Assess patient for safety, offer medications as prescribed, engage patient in 1:1 staff talks,

## 2015-10-11 DIAGNOSIS — F1994 Other psychoactive substance use, unspecified with psychoactive substance-induced mood disorder: Secondary | ICD-10-CM

## 2015-10-11 NOTE — Progress Notes (Signed)
Pt was declined at [facility] due to having [employer]; [facility] referral made on 1/27.  Peri Maris, [place] Work [phone]

## 2015-10-11 NOTE — Tx Team (Signed)
Interdisciplinary Treatment Plan Update (Adult) Date: 10/11/2015   Date: 10/11/2015 12:54 PM  Progress in Treatment:  Attending groups: Yes  Participating in groups: Yes  Taking medication as prescribed: Yes  Tolerating medication: Yes  Family/Significant othe contact made: No, Pt declines Patient understands diagnosis: Yes, AEB seeking help with depression and substance abuse Discussing patient identified problems/goals with staff: Yes  Medical problems stabilized or resolved: Yes  Denies suicidal/homicidal ideation: Yes Patient has not harmed self or Others: Yes   New problem(s) identified: None identified at this time.   Discharge Plan or Barriers: CSW will assess for appropriate discharge plan and relevant barriers.   10/11/15: [facility] referral made today.  Additional comments:  Patient and CSW reviewed pt's identified goals and treatment plan. Patient verbalized understanding and agreed to treatment plan. CSW reviewed [facility] "Discharge Process and Patient Involvement" Form. Pt verbalized understanding of information provided and signed form.   Reason for Continuation of Hospitalization:  Depression Medication stabilization Suicidal ideation  Estimated length of stay: 2-3 days  Review of initial/current patient goals per problem list:   1.  Goal(s): Patient will participate in aftercare plan  Met:  No  Target date: 3-5 days from date of admission   As evidenced by: Patient will participate within aftercare plan AEB aftercare provider and housing plan at discharge being identified.  10/08/15: CSW to work with Pt to assess for appropriate discharge plan and faciliate appointments and referrals as needed prior to d/c. 10/11/15: Referrals continuing to be made; Pt undecided about discharge plan at this time.  2.  Goal (s): Patient will exhibit decreased depressive symptoms and suicidal ideations.  Met:  No  Target date: 3-5 days from date of admission   As evidenced by: Patient  will utilize self rating of depression at 3 or below and demonstrate decreased signs of depression or be deemed stable for discharge by MD. 10/08/15: Pt was admitted with symptoms of depression, rating 10/10. Pt continues to present with flat affect and depressive symptoms.  Pt will demonstrate decreased symptoms of depression and rate depression at 3/10 or lower prior to discharge. 10/11/15: Pt continues to rate depression at high levels. Observed to have flat affect.  Attendees:  Patient:    Family:    Physician: Dr. Parke Poisson, MD  10/11/2015 12:54 PM  Nursing:   10/11/2015 12:54 PM  Clinical Social Worker Peri Maris, [place] 10/11/2015 12:54 PM  Other: Tilden Fossa, [place] 10/11/2015 12:54 PM  Clinical: Idell Pickles, RN; Grayland Ormond, RN 10/11/2015 12:54 PM  Other: , RN Charge Nurse 10/11/2015 12:54 PM  Other:     Peri Maris, [place] Social Work [phone]

## 2015-10-11 NOTE — Progress Notes (Signed)
D-  Patient has spent the majority of the shift in his room.  Patient reported depression and passive suicidal thoughts but no actionable plan.  Patient reports AH but states they have decreased.  Patient completed colostomy care this shift.   A- assess client for safety, offer medications as prescribed, engage patient in 1:1 therapeutic talks  R-  Patient able to contract for safety.

## 2015-10-11 NOTE — Progress Notes (Signed)
Patient ID: Richard [name], male   DOB: 10-May-1966, 50 y.o.   MRN: [id number] Patient ID: Richard [name], male   DOB: 1966/08/01, 50 y.o.   MRN: [id number] [facility] MD Progress Note  10/11/2015 3:21 PM Richard [name]  MRN:  [id number]  Subjective:  Patient  States he is feeling a little better. Thinks his medicines are trying to kick in. He denies any thoughts of suicide or homicide. Denies any AVH or delusional thoughts. Is visible on the unit. Participates in group milieu.  Objective : I have discussed case with treatment team and have met with patient. Patient presents some symptoms of depression with some improvement & good affect. He denies suicidal ideations. Denies auditory hallucinations, does not appear internally preoccupied. He is visible in the day room, participating in group sessions. Denies medication side effects. Denies chest pain/SOB. No disruptive or agitated behaviors on unit. Patient has colostomy as a result of colon cancer. Says his colostomy status rendered him disabled. He is asking for assistance for government subsidized housing.   Principal Problem:  Major Depression, Recurrent- Cocaine Abuse   Diagnosis:   Patient Active Problem List   Diagnosis Date Noted  . Substance induced mood disorder ([place]) [F19.94] 10/08/2015  . 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 Colon cancer s/p chemo + radiation and resection. Now with colostomy bag [C18.9] 04/10/2015  . SVT (supraventricular tachycardia) ([place]) [I47.1] 04/10/2015  . Tobacco abuse [Z72.0] 04/10/2015   Total Time spent with patient: 15 minutes  Past Medical History:  Past Medical History  Diagnosis Date  . Colon cancer ([place])   . Colon cancer ([place]) 2015  . Colostomy complication ([place]) [id number]    blood in colostomy bag    Past Surgical History  Procedure Laterality Date  . Partial colectomy  06/2014    done at [facility]   Family History:  Family History  Problem Relation Age of Onset  . Coronary artery disease Cousin 40    Social History:  History  Alcohol Use  . 0.6 oz/week  . 1 Cans of beer per week    Comment: drinks 2 times a week     History  Drug Use  . Yes  . Special: Marijuana, Cocaine    Comment: Lst used 3 days ago    Social History   Social History  . Marital Status: Single    Spouse Name: N/A  . Number of Children: N/A  . Years of Education: N/A   Social History Main Topics  . Smoking status: Current Every Day Smoker -- 0.50 packs/day    Types: Cigarettes  . Smokeless tobacco: Never Used  . Alcohol Use: 0.6 oz/week    1 Cans of beer per week     Comment: drinks 2 times a week  . Drug Use: Yes    Special: Marijuana, Cocaine     Comment: Lst used 3 days ago  . Sexual Activity: No   Other Topics Concern  . None   Social History Narrative   Additional Social History:    Pain Medications: Denies abuse Prescriptions: Denies abuse Over the Counter: Denies abuse History of alcohol / drug use?: Yes Longest period of sobriety (when/how long): Unknown Negative Consequences of Use: Financial, Personal relationships, Work / School Name of Substance 1: Alcohol 1 - Age of First Use: Adolescent 1 - Amount (size/oz): 6-12 cans of beer 1 - Frequency: Daily when available 1 - Duration: Ongoing for years 1 - Last Use / Amount: 10/06/15, six beers Name of  Substance 2: Cocaine 2 - Age of First Use: 22 2 - Amount (size/oz): Varies 2 - Frequency: Daily when available 2 - Duration: Ongoing for years 2 - Last Use / Amount: 10/06/15, $20 worth Name of Substance 3: Marijuana 3 - Age of First Use: Adolescent 3 - Amount (size/oz): varies 3 - Frequency: Daily when available 3 - Duration: Ongoing for years 3 - Last Use / Amount: 10/06/15  Sleep: improved   Appetite:  improved  Current Medications: Current Facility-Administered Medications  Medication Dose Route Frequency Provider Last Rate Last Dose  . acetaminophen (TYLENOL) tablet 650 mg  650 mg Oral Q6H PRN Laverle Hobby,  PA-C   650 mg at 10/08/15 1610  . alum & mag hydroxide-simeth (MAALOX/MYLANTA) 200-200-20 MG/5ML suspension 30 mL  30 mL Oral Q4H PRN Laverle Hobby, PA-C      . ARIPiprazole (ABILIFY) tablet 5 mg  5 mg Oral Daily Jenne Campus, MD   5 mg at 10/11/15 0816  . FLUoxetine (PROZAC) capsule 20 mg  20 mg Oral Daily Jenne Campus, MD   20 mg at 10/11/15 0816  . hydrOXYzine (ATARAX/VISTARIL) tablet 25 mg  25 mg Oral Q6H PRN Laverle Hobby, PA-C      . magnesium hydroxide (MILK OF MAGNESIA) suspension 30 mL  30 mL Oral Daily PRN Laverle Hobby, PA-C      . nicotine (NICODERM CQ - dosed in mg/24 hours) patch 21 mg  21 mg Transdermal Daily Jenne Campus, MD   21 mg at 10/09/15 1813  . traZODone (DESYREL) tablet 50 mg  50 mg Oral QHS,MR X 1 Laverle Hobby, PA-C   50 mg at 10/09/15 2239   Lab Results:  Results for orders placed or performed during the hospital encounter of 10/08/15 (from the past 48 hour(s))  CBC with Differential/Platelet     Status: Abnormal   Collection Time: 10/10/15  6:39 PM  Result Value Ref Range   WBC 4.6 4.0 - 10.5 K/uL   RBC 4.36 4.22 - 5.81 MIL/uL   Hemoglobin 13.1 13.0 - 17.0 g/dL   HCT 39.6 39.0 - 52.0 %   MCV 90.8 78.0 - 100.0 fL   MCH 30.0 26.0 - 34.0 pg   MCHC 33.1 30.0 - 36.0 g/dL   RDW 15.8 (H) 11.5 - 15.5 %   Platelets 260 150 - 400 K/uL   Neutrophils Relative % 63 %   Neutro Abs 3.0 1.7 - 7.7 K/uL   Lymphocytes Relative 24 %   Lymphs Abs 1.1 0.7 - 4.0 K/uL   Monocytes Relative 8 %   Monocytes Absolute 0.4 0.1 - 1.0 K/uL   Eosinophils Relative 5 %   Eosinophils Absolute 0.2 0.0 - 0.7 K/uL   Basophils Relative 0 %   Basophils Absolute 0.0 0.0 - 0.1 K/uL    Comment: Performed at [facility]   Physical Findings: AIMS: Facial and Oral Movements Muscles of Facial Expression: None, normal Lips and Perioral Area: None, normal Jaw: None, normal Tongue: None, normal,Extremity Movements Upper (arms, wrists, hands, fingers): None,  normal Lower (legs, knees, ankles, toes): None, normal, Trunk Movements Neck, shoulders, hips: None, normal, Overall Severity Severity of abnormal movements (highest score from questions above): None, normal Incapacitation due to abnormal movements: None, normal Patient's awareness of abnormal movements (rate only patient's report): No Awareness, Dental Status Current problems with teeth and/or dentures?: No Does patient usually wear dentures?: No  CIWA:  CIWA-Ar Total: 0 COWS:  COWS Total Score: 0  Musculoskeletal: Strength & Muscle Tone: within normal limits  No tremors , no diaphoresis Gait & Station: normal- walks slowly due to some hip/ lower back  pain Patient leans: N/A  Psychiatric Specialty Exam: ROS no chest pain, no SOB, no rash, no lower extremity edema or pain endorsed at this time  Blood pressure 126/90, pulse 85, temperature 98.2 F (36.8 C), temperature source Oral, resp. rate 20, height 5' 7.25" (1.708 m), weight 107.502 kg (237 lb), SpO2 98 %.Body mass index is 36.85 kg/(m^2).  General Appearance: Fairly Groomed  Engineer, water::  Good  Speech:  Normal Rate  Volume:  Decreased  Mood:   Remains sad , depressed  Affect:  Constricted  Thought Process:  Linear  Orientation:  Full (Time, Place, and Person)  Thought Content: Denies any auditory hallucinations, does not appear internally preoccupied, no delusions expressed .  Suicidal Thoughts: Denies any suicidal plans or intention at this time . Contracts for safety on the unit.  Homicidal Thoughts:  No  Memory:  recent and remote grossly intact   Judgement:  Fair  Insight:  Present  Psychomotor Activity:  Decreased  Concentration:  Good  Recall:  Good  Fund of Knowledge:Good  Language: Good  Akathisia:  Negative  Handed:  Right  AIMS (if indicated):     Assets:  Desire for Improvement Resilience  ADL's:  Intact  Cognition: WNL  Sleep:  Number of Hours: 4.75   Assessment - patient remains depressed with some  gradual improvement, denies auditory hallucinations, does not appear internally preoccupied at present.  Denies any suicidal ideations at this time. Tolerating medications well without side effects. Due to slightly elevated QTc and history of cardiac issues  ( SVT reported in the past ) have discussed continuing the antipsychotic Abilify . Patient in agreement.  Treatment Plan Summary: Daily contact with patient to assess and evaluate symptoms and progress in treatment, Medication management, Plan inpatient treatment  and medications as below   1. Encourage group, milieu participation to work on coping skills and symptom reduction 2. Continue to work on early recovery and relapse prevention efforts  3. Prozac 20 mgrs QDAY for depression  4. Continue Abilify 5 mgrs QDAY for mood disorder/psychotic symptoms 6. Continue Trazodone 50 mgrs QHS PRN for insomnia as needed  7. Reviewed most recent lab findings, WBC improved at 4.6  Treatment team working on disposition planning options    Encarnacion Slates, PMHNP, FNP-BC 10/11/2015, 3:21 PM  Agree with NP Progress Note as above

## 2015-10-11 NOTE — BHH Group Notes (Signed)
[facility] LCSW Aftercare Discharge Planning Group Note  10/11/2015  8:45 AM  Participation Quality: Did Not Attend. Patient invited to participate but declined.  Tilden Fossa, MSW, [place] Clinical Social Worker [facility] [phone]

## 2015-10-11 NOTE — Progress Notes (Signed)
D: Pt is alert and oriented x4. Pt endorses mild anxiety, depression and auditory hallucinations; he states, "right now all I hear are mumbling sound, I can makeup what they are saying; I am still a little anxious and depressed." Pt endorses moderate back pain of 7; states, "all I need is rest for this back pain to go away." Pt denies SI, and HI. Although Pt remained in the dayroom he practically kept to himself. Pt remained calm, cooperative, and nonviolent through the shift assessment. A: Medications offered as prescribed.  Support, encouragement, and safe environment provided.  15-minute safety checks continue. R: Pt was med compliant.  Pt attended wrap-up group. Safety checks continue

## 2015-10-11 NOTE — BHH Group Notes (Signed)
[place] LCSW Group Therapy 10/11/2015 1:15pm  Type of Therapy: Group Therapy- Feelings Around Relapse and Recovery  Pt attended group briefly, however was lethargic and disengaged. Pt left early and did not return.  Peri Maris, Latanya Presser [phone] 10/11/2015 3:44 PM

## 2015-10-11 NOTE — Progress Notes (Addendum)
Adult Psychoeducational Group Note  Date:  10/11/2015 Time:  9:38 PM  Group Topic/Focus:  Wrap-Up Group:   The focus of this group is to help patients review their daily goal of treatment and discuss progress on daily workbooks.  Participation Level:  Active  Participation Quality:  Appropriate  Affect:  Appropriate  Cognitive:  Alert  Insight: Appropriate  Engagement in Group:  Engaged  Modes of Intervention:  Discussion  Additional Comments:  Patient rated his day as a 5 because "It's been a regular day". Patient stated something positive that happened today was "I woke up."  Jalayla Chrismer L Tryson Lumley 10/11/2015, 9:38 PM

## 2015-10-12 DIAGNOSIS — F332 Major depressive disorder, recurrent severe without psychotic features: Secondary | ICD-10-CM

## 2015-10-12 MED ORDER — TRAZODONE HCL 100 MG PO TABS
100.0000 mg | ORAL_TABLET | Freq: Every day | ORAL | Status: DC
  Administered 2015-10-12 – 2015-10-15 (×4): 100 mg via ORAL
  Filled 2015-10-12 (×6): qty 1

## 2015-10-12 NOTE — BHH Group Notes (Signed)
[place] Group Notes:  (Nursing/MHT/Case Management/Adjunct)  Date:  10/12/2015  Time:  1:20 PM   Type of Therapy:  Psychoeducational Skills  Participation Level:  Did Not Attend  Participation Quality:  DID NOT ATTEND  Affect:  DID NOT ATTEND  Cognitive:  DID NOT ATTEND  Insight:  None  Engagement in Group:  DID NOT ATTEND  Modes of Intervention:  DID NOT ATTEND  Summary of Progress/Problems: Pt did not attend patient healthy coping skills group.  10/12/2015, 1:20 PM

## 2015-10-12 NOTE — Progress Notes (Signed)
DAR NOTE: Pt present with flat affect and depressed mood in the unit. Pt has been isolating himself and has been in bed most of the time. Pt complained of left lower back pain, took all his meds as scheduled. Pt rate depression at 3, hopeless ness at 0, and anxiety at a 4. Pt's safety ensured with 15 minute and environmental checks. Pt currently denies SI/HI and A/V hallucinations. Pt verbally agrees to seek staff if SI/HI or A/VH occurs and to consult with staff before acting on these thoughts. Will continue POC.

## 2015-10-12 NOTE — Progress Notes (Signed)
D: Pt is alert and oriented x4. Pt continues to endorse mild anxiety, depression; however, this time denies auditory hallucinations; he states, "the voices have stopped for now, still very depressed, still very anxious." Pt endorses back pain of 4 on a 0-10 pain scale; states, "My back still hurts, but it will get better once I get in bed." Pt denies SI, and HI. Although Pt remained in the dayroom he practically kept to himself. Pt remained calm, cooperative, and nonviolent through the shift assessment. A: Medications offered as prescribed.  Support, encouragement, and safe environment provided.  15-minute safety checks continue. R: Pt refused night time Trazodone 50mg  for sleep.  Pt attended wrap-up group. Safety checks continue.

## 2015-10-12 NOTE — Progress Notes (Signed)
[facility] MD Progress Note  10/12/2015 2:46 PM Richard [name]  MRN:  [id number] Subjective: Patient states " I've been better, I am dealing with a lot of stress right now"   Objective: Patient  is awake, alert and oriented X3 , found sitting in the dayroom.  Denies suicidal or homicidal ideation. Denies auditory or visual hallucination and does not appear to be responding to internal stimuli. Patient interacts well with staff and others. Patient reports he is medication compliant without mediation side effects.  States his depression 8/10. Patient states Donnald Garre been better, I am dealing with a lot of stress right now." Patient did not elaborate on current  stressors. Reports good appetite and has been resting well. Support, encouragement and reassurance was provided.   Principal Problem:  Major Depression, Recurrent- Cocaine Abuse  Diagnosis:   Patient Active Problem List   Diagnosis Date Noted  . Substance induced mood disorder ([place]) [F19.94] 10/08/2015  . 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 Colon cancer s/p chemo + radiation and resection. Now with colostomy bag [C18.9] 04/10/2015  . SVT (supraventricular tachycardia) ([place]) [I47.1] 04/10/2015  . Tobacco abuse [Z72.0] 04/10/2015   Total Time spent with patient: 20 minutes    Past Medical History:  Past Medical History  Diagnosis Date  . Colon cancer ([place])   . Colon cancer ([place]) 2015  . Colostomy complication ([place]) [id number]    blood in colostomy bag    Past Surgical History  Procedure Laterality Date  . Partial colectomy  06/2014    done at [facility]   Family History:  Family History  Problem Relation Age of Onset  . Coronary artery disease Cousin 76    Social History:  History  Alcohol Use  . 0.6 oz/week  . 1 Cans of beer per week    Comment: drinks 2 times a week     History  Drug Use  . Yes  . Special: Marijuana, Cocaine    Comment: Lst used 3 days ago    Social History   Social History  . Marital Status: Single     Spouse Name: N/A  . Number of Children: N/A  . Years of Education: N/A   Social History Main Topics  . Smoking status: Current Every Day Smoker -- 0.50 packs/day    Types: Cigarettes  . Smokeless tobacco: Never Used  . Alcohol Use: 0.6 oz/week    1 Cans of beer per week     Comment: drinks 2 times a week  . Drug Use: Yes    Special: Marijuana, Cocaine     Comment: Lst used 3 days ago  . Sexual Activity: No   Other Topics Concern  . None   Social History Narrative   Additional Social History:    Pain Medications: Denies abuse Prescriptions: Denies abuse Over the Counter: Denies abuse History of alcohol / drug use?: Yes Longest period of sobriety (when/how long): Unknown Negative Consequences of Use: Financial, Personal relationships, Work / School Name of Substance 1: Alcohol 1 - Age of First Use: Adolescent 1 - Amount (size/oz): 6-12 cans of beer 1 - Frequency: Daily when available 1 - Duration: Ongoing for years 1 - Last Use / Amount: 10/06/15, six beers Name of Substance 2: Cocaine 2 - Age of First Use: 22 2 - Amount (size/oz): Varies 2 - Frequency: Daily when available 2 - Duration: Ongoing for years 2 - Last Use / Amount: 10/06/15, $20 worth Name of Substance 3: Marijuana 3 - Age of First  Use: Adolescent 3 - Amount (size/oz): varies 3 - Frequency: Daily when available 3 - Duration: Ongoing for years 3 - Last Use / Amount: 10/06/15  Sleep: Fair  Appetite:  Fair  Current Medications: Current Facility-Administered Medications  Medication Dose Route Frequency Provider Last Rate Last Dose  . acetaminophen (TYLENOL) tablet 650 mg  650 mg Oral Q6H PRN Laverle Hobby, PA-C   650 mg at 10/12/15 0843  . alum & mag hydroxide-simeth (MAALOX/MYLANTA) 200-200-20 MG/5ML suspension 30 mL  30 mL Oral Q4H PRN Laverle Hobby, PA-C   30 mL at 10/11/15 2158  . ARIPiprazole (ABILIFY) tablet 5 mg  5 mg Oral Daily Jenne Campus, MD   5 mg at 10/12/15 0843  . FLUoxetine  (PROZAC) capsule 20 mg  20 mg Oral Daily Jenne Campus, MD   20 mg at 10/12/15 0843  . hydrOXYzine (ATARAX/VISTARIL) tablet 25 mg  25 mg Oral Q6H PRN Laverle Hobby, PA-C      . magnesium hydroxide (MILK OF MAGNESIA) suspension 30 mL  30 mL Oral Daily PRN Laverle Hobby, PA-C      . nicotine (NICODERM CQ - dosed in mg/24 hours) patch 21 mg  21 mg Transdermal Daily Jenne Campus, MD   21 mg at 10/09/15 1813  . traZODone (DESYREL) tablet 50 mg  50 mg Oral QHS,MR X 1 Laverle Hobby, PA-C   50 mg at 10/09/15 2239    Lab Results:  Results for orders placed or performed during the hospital encounter of 10/08/15 (from the past 48 hour(s))  CBC with Differential/Platelet     Status: Abnormal   Collection Time: 10/10/15  6:39 PM  Result Value Ref Range   WBC 4.6 4.0 - 10.5 K/uL   RBC 4.36 4.22 - 5.81 MIL/uL   Hemoglobin 13.1 13.0 - 17.0 g/dL   HCT 39.6 39.0 - 52.0 %   MCV 90.8 78.0 - 100.0 fL   MCH 30.0 26.0 - 34.0 pg   MCHC 33.1 30.0 - 36.0 g/dL   RDW 15.8 (H) 11.5 - 15.5 %   Platelets 260 150 - 400 K/uL   Neutrophils Relative % 63 %   Neutro Abs 3.0 1.7 - 7.7 K/uL   Lymphocytes Relative 24 %   Lymphs Abs 1.1 0.7 - 4.0 K/uL   Monocytes Relative 8 %   Monocytes Absolute 0.4 0.1 - 1.0 K/uL   Eosinophils Relative 5 %   Eosinophils Absolute 0.2 0.0 - 0.7 K/uL   Basophils Relative 0 %   Basophils Absolute 0.0 0.0 - 0.1 K/uL    Comment: Performed at [facility]    Physical Findings: AIMS: Facial and Oral Movements Muscles of Facial Expression: None, normal Lips and Perioral Area: None, normal Jaw: None, normal Tongue: None, normal,Extremity Movements Upper (arms, wrists, hands, fingers): None, normal Lower (legs, knees, ankles, toes): None, normal, Trunk Movements Neck, shoulders, hips: None, normal, Overall Severity Severity of abnormal movements (highest score from questions above): None, normal Incapacitation due to abnormal movements: None,  normal Patient's awareness of abnormal movements (rate only patient's report): No Awareness, Dental Status Current problems with teeth and/or dentures?: No Does patient usually wear dentures?: No  CIWA:  CIWA-Ar Total: 0 COWS:  COWS Total Score: 0  Musculoskeletal: Strength & Muscle Tone: within normal limits Gait & Station: normal Patient leans: N/A  Psychiatric Specialty Exam: Review of Systems  Psychiatric/Behavioral: Positive for depression and substance abuse. Negative for suicidal ideas. The patient is  nervous/anxious and has insomnia.   All other systems reviewed and are negative.  no chest pain, no SOB, no rash, no lower extremity edema or pain endorsed at this time  Blood pressure 127/96, pulse 81, temperature 97.9 F (36.6 C), temperature source Oral, resp. rate 20, height 5' 7.25" (1.708 m), weight 107.502 kg (237 lb), SpO2 98 %.Body mass index is 36.85 kg/(m^2).  General Appearance: Casual and Fairly Groomed  Eye Contact::  Good  Speech:  Normal Rate  Volume:  Decreased  Mood:   Remains sad , depressed  Affect:  Constricted  Thought Process:  Linear  Orientation:  Full (Time, Place, and Person)  Thought Content:   Intact  Suicidal Thoughts:   Denies   Homicidal Thoughts:  No  Memory:  recent and remote grossly intact   Judgement:  Fair  Insight:  Present  Psychomotor Activity:  Decreased  Concentration:  Good  Recall:  Good  Fund of Knowledge:Good  Language: Good  Akathisia:  Negative  Handed:  Right  AIMS (if indicated):     Assets:  Desire for Improvement Resilience  ADL's:  Intact  Cognition: WNL  Sleep:  Number of Hours: 5.25      I agree with current treatment plan on 10/12/2015, Patient seen face-to-face for psychiatric evaluation follow-up, chart reviewed and case discussed. Reviewed the information documented and agree with the treatment plan.  Treatment Plan Summary: Daily contact with patient to assess and evaluate symptoms and progress in  treatment, Medication management, Plan inpatient treatment  and medications as below    1. Encourage group, milieu participation to work on coping skills and symptom reduction 2. Continue to work on early recovery and relapse prevention efforts  3. Prozac 20 mgrs QDAY for depression  4. D/C Risperidone  5. Continue Abilify 5 mgrs QDAY for mood disorder and psychotic symptoms 6. Increase Trazodone 100 mgrs QHS PRN for insomnia as needed  USD + Cocaine and [place] Will recheck CBC to follow up on decreased WBC on1/26/17 WNL Treatment team working on disposition planning options    Derrill Center FNP-BC 10/12/2015, 2:46 PM I agree with assessment and plan Geralyn Flash A. Sabra Heck, M.D.

## 2015-10-12 NOTE — Progress Notes (Signed)
D. Pt pleasant on approach, jovial manner.  Denies complaints at this time.  Positive for evening wrap up group with appropriate participation.  Interacting appropriately with peers on the unit.  Denies SI/HI/hallucinations at this time.  A.  Support and encouragement offered  R.  Pt remains safe on the unit, will continue to monitor.

## 2015-10-12 NOTE — BHH Group Notes (Signed)
[place] LCSW Group Therapy  10/12/2015  11 - 11:45 AM  Type of Therapy:  Group Therapy  Participation Level: Appropriate  Participation Quality: Responsive to prompts  Affect:  Flat  Cognitive:  Alert and oriented  Insight: Developing  Engagement in Therapy: Engable  Modes of Intervention:  Activity, Discussion, Education, Exploration, Rapport Building, Socialization and Support  Summary of Progress/Problems: The main focus of today's process group was to discuss what "self-sabotage" means and use Motivational Interviewing to discuss what benefits, negative or positive, were involved in a self-identified self-sabotaging behavior. We then talked about reasons they may want to change the behavior and their current desire to change. Patients participated in a brief relaxation exercise and then processed the experience. Patient was only present for last 15-20 minutes of group yet engaged easily and shared desire to "do better."   Sheilah Pigeon, LCSW

## 2015-10-12 NOTE — Progress Notes (Signed)
Adult Psychoeducational Group Note  Date:  10/12/2015 Time:  9:14 PM  Group Topic/Focus:  Wrap-Up Group:   The focus of this group is to help patients review their daily goal of treatment and discuss progress on daily workbooks.  Participation Level:  Active  Participation Quality:  Appropriate and Attentive  Affect:  Appropriate  Cognitive:  Appropriate  Insight: Appropriate  Engagement in Group:  Engaged  Modes of Intervention:  Discussion  Additional Comments:  Pt stated he got rid of his headache today. Pt stated his goal for tomorrow is to keep his headache away.  Clint Bolder 10/12/2015, 9:14 PM

## 2015-10-13 MED ORDER — FLUOXETINE HCL 10 MG PO CAPS
10.0000 mg | ORAL_CAPSULE | Freq: Every day | ORAL | Status: DC
  Administered 2015-10-14: 10 mg via ORAL
  Filled 2015-10-13 (×3): qty 1

## 2015-10-13 NOTE — Progress Notes (Signed)
DAR NOTE: Pt present with flat affect and depressed mood in the unit. Pt has been present in the milieu interacting with peer, took all his meds as scheduled. As per self inventory, pt had a good night sleep, good appetite, normal energy, and good concentration. Pt rate depression at 8, hopeless ness at 8, and anxiety 8. Pt's safety ensured with 15 minute and environmental checks. Pt currently denies SI/HI and A/V hallucinations. Pt verbally agrees to seek staff if SI/HI or A/VH occurs and to consult with staff before acting on these thoughts. Will continue POC.

## 2015-10-13 NOTE — Progress Notes (Signed)
[facility] MD Progress Note  10/13/2015 3:58 PM Richard [name]  MRN:  [id number] Subjective: Patient states " Every time I take this medication I get a headache"   Objective: Patient  is awake, alert and oriented X3 , found resting in his bedroom. Patient is flat, guarded but pleasant.  Denies suicidal or homicidal ideation. Denies auditory or visual hallucination and does not appear to be responding to internal stimuli. Patient interacts well with staff and others. Patient reports he is medication compliant with medication side effects of headache.  States his depression 7/10. Patient states Ive till have a lot on my mind. And I am still stressed out. Patient did not elaborate on current stressors. Reports good appetite and has been resting well. Support, encouragement and reassurance was provided.   Principal Problem:  Major Depression, Recurrent- Cocaine Abuse  Diagnosis:   Patient Active Problem List   Diagnosis Date Noted  . Substance induced mood disorder ([place]) [F19.94] 10/08/2015  . Colostomy complication ([place]) [V85.92]   . Colon cancer s/p chemo + radiation and resection. Now with colostomy bag [C18.9] 04/10/2015  . SVT (supraventricular tachycardia) ([place]) [I47.1] 04/10/2015  . Tobacco abuse [Z72.0] 04/10/2015   Total Time spent with patient: 20 minutes    Past Medical History:  Past Medical History  Diagnosis Date  . Colon cancer ([place])   . Colon cancer ([place]) 2015  . Colostomy complication ([place]) [id number]    blood in colostomy bag    Past Surgical History  Procedure Laterality Date  . Partial colectomy  06/2014    done at [facility]   Family History:  Family History  Problem Relation Age of Onset  . Coronary artery disease Cousin 22    Social History:  History  Alcohol Use  . 0.6 oz/week  . 1 Cans of beer per week    Comment: drinks 2 times a week     History  Drug Use  . Yes  . Special: Marijuana, Cocaine    Comment: Lst used 3 days ago    Social History   Social  History  . Marital Status: Single    Spouse Name: N/A  . Number of Children: N/A  . Years of Education: N/A   Social History Main Topics  . Smoking status: Current Every Day Smoker -- 0.50 packs/day    Types: Cigarettes  . Smokeless tobacco: Never Used  . Alcohol Use: 0.6 oz/week    1 Cans of beer per week     Comment: drinks 2 times a week  . Drug Use: Yes    Special: Marijuana, Cocaine     Comment: Lst used 3 days ago  . Sexual Activity: No   Other Topics Concern  . None   Social History Narrative   Additional Social History:    Pain Medications: Denies abuse Prescriptions: Denies abuse Over the Counter: Denies abuse History of alcohol / drug use?: Yes Longest period of sobriety (when/how long): Unknown Negative Consequences of Use: Financial, Personal relationships, Work / School Name of Substance 1: Alcohol 1 - Age of First Use: Adolescent 1 - Amount (size/oz): 6-12 cans of beer 1 - Frequency: Daily when available 1 - Duration: Ongoing for years 1 - Last Use / Amount: 10/06/15, six beers Name of Substance 2: Cocaine 2 - Age of First Use: 22 2 - Amount (size/oz): Varies 2 - Frequency: Daily when available 2 - Duration: Ongoing for years 2 - Last Use / Amount: 10/06/15, $20 worth Name of Substance 3: Marijuana  3 - Age of First Use: Adolescent 3 - Amount (size/oz): varies 3 - Frequency: Daily when available 3 - Duration: Ongoing for years 3 - Last Use / Amount: 10/06/15  Sleep: Fair  Appetite:  Fair  Current Medications: Current Facility-Administered Medications  Medication Dose Route Frequency Provider Last Rate Last Dose  . acetaminophen (TYLENOL) tablet 650 mg  650 mg Oral Q6H PRN Laverle Hobby, PA-C   650 mg at 10/12/15 0843  . alum & mag hydroxide-simeth (MAALOX/MYLANTA) 200-200-20 MG/5ML suspension 30 mL  30 mL Oral Q4H PRN Laverle Hobby, PA-C   30 mL at 10/11/15 2158  . ARIPiprazole (ABILIFY) tablet 5 mg  5 mg Oral Daily Jenne Campus, MD   5  mg at 10/13/15 0034  . FLUoxetine (PROZAC) capsule 20 mg  20 mg Oral Daily Jenne Campus, MD   20 mg at 10/13/15 9179  . hydrOXYzine (ATARAX/VISTARIL) tablet 25 mg  25 mg Oral Q6H PRN Laverle Hobby, PA-C      . magnesium hydroxide (MILK OF MAGNESIA) suspension 30 mL  30 mL Oral Daily PRN Laverle Hobby, PA-C      . nicotine (NICODERM CQ - dosed in mg/24 hours) patch 21 mg  21 mg Transdermal Daily Jenne Campus, MD   21 mg at 10/09/15 1813  . traZODone (DESYREL) tablet 100 mg  100 mg Oral QHS Derrill Center, NP   100 mg at 10/12/15 2229    Lab Results:  No results found for this or any previous visit (from the past 43 hour(s)).  Physical Findings: AIMS: Facial and Oral Movements Muscles of Facial Expression: None, normal Lips and Perioral Area: None, normal Jaw: None, normal Tongue: None, normal,Extremity Movements Upper (arms, wrists, hands, fingers): None, normal Lower (legs, knees, ankles, toes): None, normal, Trunk Movements Neck, shoulders, hips: None, normal, Overall Severity Severity of abnormal movements (highest score from questions above): None, normal Incapacitation due to abnormal movements: None, normal Patient's awareness of abnormal movements (rate only patient's report): No Awareness, Dental Status Current problems with teeth and/or dentures?: No Does patient usually wear dentures?: No  CIWA:  CIWA-Ar Total: 0 COWS:  COWS Total Score: 0  Musculoskeletal: Strength & Muscle Tone: within normal limits Gait & Station: normal Patient leans: N/A  Psychiatric Specialty Exam: Review of Systems  Psychiatric/Behavioral: Positive for depression and substance abuse. Negative for suicidal ideas. The patient is nervous/anxious and has insomnia.   All other systems reviewed and are negative.  no chest pain, no SOB, no rash, no lower extremity edema or pain endorsed at this time  Blood pressure 123/79, pulse 82, temperature 97.6 F (36.4 C), temperature source Oral,  resp. rate 18, height 5' 7.25" (1.708 m), weight 107.502 kg (237 lb), SpO2 98 %.Body mass index is 36.85 kg/(m^2).  General Appearance: Casual pleasant, clam and cooperative  Eye Contact::  Good  Speech:  Normal Rate  Volume:  Decreased  Mood:   Remains sad , depressed 8/10  Affect:  Constricted  Thought Process:  Linear  Orientation:  Full (Time, Place, and Person)  Thought Content:   Intact  Suicidal Thoughts:   Denies   Homicidal Thoughts:  No  Memory:  recent and remote grossly intact   Judgement:  Fair  Insight:  Present  Psychomotor Activity:  Decreased  Concentration:  Good  Recall:  Good  Fund of Knowledge:Good  Language: Good  Akathisia:  Negative  Handed:  Right  AIMS (if indicated):  Assets:  Desire for Improvement Resilience  ADL's:  Intact  Cognition: WNL  Sleep:  Number of Hours: 6.25      I agree with current treatment plan on 10/13/2015, Patient seen face-to-face for psychiatric evaluation follow-up, chart reviewed and case discussed. Reviewed the information documented and agree with the treatment plan.  Treatment Plan Summary: Daily contact with patient to assess and evaluate symptoms and progress in treatment, Medication management, Plan inpatient treatment  and medications as below    1. Encourage group, milieu participation to work on coping skills and symptom reduction 2. Continue to work on early recovery and relapse prevention efforts  3. Prozac 20 mgrs QDAY for depression. Decreased Prozac to 10 mgs. monitor for side effects.  4. D/C Risperidone  5. Continue Abilify 5 mgrs QDAY for mood disorder and psychotic symptoms 6. Increase Trazodone 100 mgrs QHS PRN for insomnia as needed  USD + Cocaine and [place] Will recheck CBC to follow up on decreased WBC on1/26/17 WNL Treatment team working on disposition planning options    Derrill Center FNP-BC 10/13/2015, 3:58 PM I agree with assessment and plan Geralyn Flash A. Sabra Heck, M.D.

## 2015-10-13 NOTE — BHH Group Notes (Signed)
[facility] Group Notes:  (Nursing/MHT/Case Management/Adjunct)  Date:  10/13/2015  Time:  11:04 AM   Type of Therapy:  Psychoeducational Skills  Participation Level:  Did Not Attend  Participation Quality:  DID NOT ATTEND  Affect:  DID NOT ATTEND  Cognitive:  DID NOT ATTEND  Insight:  None  Engagement in Group:  DID NOT ATTEND  Modes of Intervention:  DID NOT ATTEND  Summary of Progress/Problems: Pt did not attend patient healthy support system group.  Wolfgang Phoenix 10/13/2015, 11:04 AM

## 2015-10-13 NOTE — BHH Group Notes (Signed)
[place] LCSW Group Therapy Note   10/13/2015  10 AM   Type of Therapy and Topic: Group Therapy: Feelings Around Returning Home & Establishing a Supportive Framework and Activity to Identify signs of Improvement or Decompensation   Participation Level: Did not attend although encouraged to do so   Sheilah Pigeon, LCSW

## 2015-10-13 NOTE — Progress Notes (Signed)
D.  Pt pleasant on approach, denies complaints at this time.  Positive for evening wrap up group, interacting appropriately with peers on the unit.  Denies SI/HI/halluicnatons at this time.  A.  Support and encouragement offered  R.  Pt remains safe on the unit, will continue to monitor.

## 2015-10-14 MED ORDER — FLUOXETINE HCL 20 MG PO CAPS
20.0000 mg | ORAL_CAPSULE | Freq: Every day | ORAL | Status: DC
  Administered 2015-10-15 – 2015-10-16 (×2): 20 mg via ORAL
  Filled 2015-10-14 (×3): qty 1

## 2015-10-14 NOTE — Progress Notes (Signed)
D:Pt presents with flat affect and depressed mood. Pt rates depression 8/10. Hopeless 8/10. Anxiety 8/10. Pt denies suicidal thoughts. Pt verbalized depression due to having a "bag" pt referring to a colostomy bag. Pt stressors related to having a colostomy and how that prevents him from having a job and a relationship with a woman. Pt compliant with taking meds. Pt verbalized that the Abilify is causing him to have headaches. Dr. Parke Poisson made aware.  A: Medications reviewed with pt. Medications administered as ordered per MD. Verbal support provided. Pt encouraged to attend groups. 15 minute checks performed for safety.  R: Pt verbalizes understanding of med regimen. Pt safety maintained.

## 2015-10-14 NOTE — Plan of Care (Signed)
Problem: Diagnosis: Increased Risk For Suicide Attempt Goal: LTG-Patient Will Report Improved Mood and Deny Suicidal LTG (by discharge) Patient will report improved mood and deny suicidal ideation.  Outcome: Progressing Pt has denied suicidal ideation, has been up and active within milieu

## 2015-10-14 NOTE — BHH Group Notes (Signed)
[facility] LCSW Aftercare Discharge Planning Group Note  10/14/2015 8:45 AM  Pt did not attend, declined invitation.   Peri Maris, [place] 10/14/2015 9:26 AM

## 2015-10-14 NOTE — BHH Group Notes (Signed)
[place] LCSW Group Therapy  10/14/2015 1:15pm  Type of Therapy:  Group Therapy vercoming Obstacles  Pt did not attend, declined invitation.   Peri Maris, [place] 10/14/2015 3:57 PM

## 2015-10-14 NOTE — Progress Notes (Signed)
Adult Psychoeducational Group Note  Date:  10/14/2015 Time:  8:37 PM  Group Topic/Focus:  Wrap-Up Group:   The focus of this group is to help patients review their daily goal of treatment and discuss progress on daily workbooks.  Participation Level:  Active  Participation Quality:  Appropriate  Affect:  Appropriate  Cognitive:  Appropriate  Insight: Appropriate  Engagement in Group:  Engaged  Modes of Intervention:  Discussion  Additional Comments:  Pt was pleasant during wrap-up group. Pt rated his overall day an 8 out of 10 and stated that his day was "pretty cool" because "I was just trippin today". Pt reported that he did not have a goal for the day.   Lincoln Brigham 10/14/2015, 10:27 PM

## 2015-10-14 NOTE — Plan of Care (Signed)
Problem: Alteration in mood & ability to function due to Goal: STG-Patient will attend groups Outcome: Progressing Pt has attended group this weekend

## 2015-10-14 NOTE — Progress Notes (Signed)
Patient ID: Richard [name], male   DOB: Oct 05, 1965, 50 y.o.   MRN: [id number] [facility] MD Progress Note  10/14/2015 6:32 PM Richard [name]  MRN:  [id number] Subjective: Patient reports headaches, which he thinks may be a side effect to current Prozac or Abilify trials. He states headache is mild and not severe.   Objective:  I have discussed case with treatment team and have met with patient.  Patient is reporting some headache, as above. Characterizes it as mild- we discussed options- as he feels medications are helping , and are otherwise well tolerated, he prefers to continue current medication regimen and monitor , rather than changing medications at this time. No disruptive or agitated behaviors on unit, going to groups . Presents with partial improvement in mood and affect, but reports ongoing sense of depression, sadness. Denies any SI.  Of note , denies any psychotic symptoms and does not appear internally preoccupied . Tends to ruminate about how colostomy bag has affected his sense of self esteem and affected his sex life negatively.  Responsive to support, encouragement, empathy.  Principal Problem:  Major Depression, Recurrent- Cocaine Abuse  Diagnosis:   Patient Active Problem List   Diagnosis Date Noted  . Substance induced mood disorder ([place]) [F19.94] 10/08/2015  . 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 Colon cancer s/p chemo + radiation and resection. Now with colostomy bag [C18.9] 04/10/2015  . SVT (supraventricular tachycardia) ([place]) [I47.1] 04/10/2015  . Tobacco abuse [Z72.0] 04/10/2015   Total Time spent with patient: 20 minutes    Past Medical History:  Past Medical History  Diagnosis Date  . Colon cancer ([place])   . Colon cancer ([place]) 2015  . Colostomy complication ([place]) 84-69-62    blood in colostomy bag    Past Surgical History  Procedure Laterality Date  . Partial colectomy  06/2014    done at [facility]   Family History:  Family History  Problem Relation Age of  Onset  . Coronary artery disease Cousin 8    Social History:  History  Alcohol Use  . 0.6 oz/week  . 1 Cans of beer per week    Comment: drinks 2 times a week     History  Drug Use  . Yes  . Special: Marijuana, Cocaine    Comment: Lst used 3 days ago    Social History   Social History  . Marital Status: Single    Spouse Name: N/A  . Number of Children: N/A  . Years of Education: N/A   Social History Main Topics  . Smoking status: Current Every Day [name] -- 0.50 packs/day    Types: Cigarettes  . Smokeless tobacco: Never Used  . Alcohol Use: 0.6 oz/week    1 Cans of beer per week     Comment: drinks 2 times a week  . Drug Use: Yes    Special: Marijuana, Cocaine     Comment: Lst used 3 days ago  . Sexual Activity: No   Other Topics Concern  . None   Social History Narrative   Additional Social History:    Pain Medications: Denies abuse Prescriptions: Denies abuse Over the Counter: Denies abuse History of alcohol / drug use?: Yes Longest period of sobriety (when/how long): Unknown Negative Consequences of Use: Financial, Personal relationships, Work / School Name of Substance 1: Alcohol 1 - Age of First Use: Adolescent 1 - Amount (size/oz): 6-12 cans of beer 1 - Frequency: Daily when available 1 - Duration: Ongoing for years 1 -  Last Use / Amount: 10/06/15, six beers Name of Substance 2: Cocaine 2 - Age of First Use: 22 2 - Amount (size/oz): Varies 2 - Frequency: Daily when available 2 - Duration: Ongoing for years 2 - Last Use / Amount: 10/06/15, $20 worth Name of Substance 3: Marijuana 3 - Age of First Use: Adolescent 3 - Amount (size/oz): varies 3 - Frequency: Daily when available 3 - Duration: Ongoing for years 3 - Last Use / Amount: 10/06/15  Sleep: Fair  Appetite:  Fair  Current Medications: Current Facility-Administered Medications  Medication Dose Route Frequency Provider Last Rate Last Dose  . acetaminophen (TYLENOL) tablet 650 mg   650 mg Oral Q6H PRN Laverle Hobby, PA-C   650 mg at 10/12/15 0843  . alum & mag hydroxide-simeth (MAALOX/MYLANTA) 200-200-20 MG/5ML suspension 30 mL  30 mL Oral Q4H PRN Laverle Hobby, PA-C   30 mL at 10/14/15 1712  . ARIPiprazole (ABILIFY) tablet 5 mg  5 mg Oral Daily Jenne Campus, MD   5 mg at 10/14/15 0810  . FLUoxetine (PROZAC) capsule 10 mg  10 mg Oral Daily Derrill Center, NP   10 mg at 10/14/15 0810  . hydrOXYzine (ATARAX/VISTARIL) tablet 25 mg  25 mg Oral Q6H PRN Laverle Hobby, PA-C      . magnesium hydroxide (MILK OF MAGNESIA) suspension 30 mL  30 mL Oral Daily PRN Laverle Hobby, PA-C      . nicotine (NICODERM CQ - dosed in mg/24 hours) patch 21 mg  21 mg Transdermal Daily Jenne Campus, MD   21 mg at 10/09/15 1813  . traZODone (DESYREL) tablet 100 mg  100 mg Oral QHS Derrill Center, NP   100 mg at 10/13/15 2228    Lab Results:  No results found for this or any previous visit (from the past 2 hour(s)).  Physical Findings: AIMS: Facial and Oral Movements Muscles of Facial Expression: None, normal Lips and Perioral Area: None, normal Jaw: None, normal Tongue: None, normal,Extremity Movements Upper (arms, wrists, hands, fingers): None, normal Lower (legs, knees, ankles, toes): None, normal, Trunk Movements Neck, shoulders, hips: None, normal, Overall Severity Severity of abnormal movements (highest score from questions above): None, normal Incapacitation due to abnormal movements: None, normal Patient's awareness of abnormal movements (rate only patient's report): No Awareness, Dental Status Current problems with teeth and/or dentures?: No Does patient usually wear dentures?: No  CIWA:  CIWA-Ar Total: 0 COWS:  COWS Total Score: 0  Musculoskeletal: Strength & Muscle Tone: within normal limits Gait & Station: normal Patient leans: N/A  Psychiatric Specialty Exam: Review of Systems  Psychiatric/Behavioral: Positive for depression and substance abuse. Negative  for suicidal ideas. The patient is nervous/anxious and has insomnia.   All other systems reviewed and are negative.  no chest pain, no SOB, no rash, no lower extremity edema or pain endorsed at this time  Blood pressure 116/96, pulse 82, temperature 97.6 F (36.4 C), temperature source Oral, resp. rate 16, height 5' 7.25" (1.708 m), weight 237 lb (107.502 kg), SpO2 98 %.Body mass index is 36.85 kg/(m^2).  General Appearance:  Fairly groomed   Engineer, water::  Good  Speech:  Normal Rate  Volume:  Decreased  Mood:   Depressed, but partially improved compared to admission  Affect:  Constricted but reactive and does smile at times appropriately   Thought Process:  Linear  Orientation:  Full (Time, Place, and Person)  Thought Content:  No hallucinations at this time and  not internally preoccupied, no delusions   Suicidal Thoughts:   Denies any current suicidal or self injurious ideations   Homicidal Thoughts:  No  Memory:  recent and remote grossly intact   Judgement:  Fair  Insight:  Present  Psychomotor Activity:  Normal   Concentration:  Good  Recall:  Good  Fund of Knowledge:Good  Language: Good  Akathisia:  Negative  Handed:  Right  AIMS (if indicated):     Assets:  Desire for Improvement Resilience  ADL's:  Intact  Cognition: WNL  Sleep:  Number of Hours: 6   Assessment - at this time patient partially improved compared to admission- mood still depressed, but partially improved and somewhat more reactive range of affect. No further psychotic symptoms, and is future oriented. Mild headache, which may be related to Prozac or Abilify, but otherwise denies side effects and is wanting to continue current medication regimen at this time.  Treatment Plan Summary: Daily contact with patient to assess and evaluate symptoms and progress in treatment, Medication management, Plan inpatient treatment  and medications as below    Encourage group, milieu participation to work on coping skills  and symptom reduction Continue Prozac  20 mgrs QDAY for depression. Continue Abilify 5 mgrs QDAY for mood disorder and psychotic symptoms Continue  Trazodone 100 mgrs QHS PRN for insomnia as needed  Treatment team working on disposition planning options    Neita Garnet , MD   10/14/2015, 6:32 PM

## 2015-10-14 NOTE — Progress Notes (Signed)
D:Patient in the dayroom on approach.  Patient states he had a better day today.  Patient states he has been having a better day because he was able to interact and talk to peers.  Patient states his goal for today was to laugh which he did.  Patient states his depression is less. Patient denies SI/HI and AVH.   A: Staff to monitor Q 15 mins for safety.  Encouragement and support offered.  Scheduled medications administered per orders.   R: Patient remains safe on the unit.  Patient attended group tonight.  Patient visible on the unit and interacting with peers.  Patient taking administered medications.

## 2015-10-14 NOTE — Progress Notes (Signed)
[facility] Group Notes:  (Nursing/MHT/Case Management/Adjunct)  Date:  10/14/2015  Time:  12:09 AM  Type of Therapy:  Psychoeducational Skills  Participation Level:  Active  Participation Quality:  Attentive  Affect:  Blunted  Cognitive:  Appropriate  Insight:  Appropriate  Engagement in Group:  Developing/Improving  Modes of Intervention:  Activity  Summary of Progress/Problems: The patient verbalized that he had a good day overall since he slept a great deal. He is complaining of a headache. As a theme for the day, his support system will consist of his family.   Cassadie Pankonin S 10/14/2015, 12:09 AM

## 2015-10-15 NOTE — Progress Notes (Addendum)
Patient ID: Richard [name], male   DOB: [birth year], 50 y.o.   MRN: [id number] [facility] MD Progress Note  10/15/2015 9:29 AM Richard [name]  MRN:  [id number] Subjective:  Patient reports that he is feeling partially better than on admission. He is tolerating medications well .    Objective:  I have discussed case with treatment team and have met with patient.  Patient presenting with gradual improvement - depression has improved and is less depressed and presenting with fuller range of affect at this time . Auditory hallucinations, have improved , currently denies halls and does not appear internally preoccupied . As he improves, he has become more visible on unit and has been going to some groups and has been more interactive with peers . Denies medication side effects.  Tolerating medications well . Today denies headaches or other side effects.  He is focusing more on disposition planning options- today states he may have housing option to live with his son.  Has had fleeting cravings for cocaine, but states he is highly motivated in maintaining sobriety after discharge .   Principal Problem:  Major Depression, Recurrent- Cocaine Abuse  Diagnosis:   Patient Active Problem List   Diagnosis Date Noted  . Substance induced mood disorder ([place]) [F19.94] 10/08/2015  . 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 Colon cancer s/p chemo + radiation and resection. Now with colostomy bag [C18.9] 04/10/2015  . SVT (supraventricular tachycardia) ([place]) [I47.1] 04/10/2015  . Tobacco abuse [Z72.0] 04/10/2015   Total Time spent with patient: 20 minutes    Past Medical History:  Past Medical History  Diagnosis Date  . Colon cancer ([place])   . Colon cancer ([place]) 2015  . Colostomy complication ([place]) 28-31-51    blood in colostomy bag    Past Surgical History  Procedure Laterality Date  . Partial colectomy  06/2014    done at [facility]   Family History:  Family History  Problem Relation Age of Onset  .  Coronary artery disease Cousin 58    Social History:  History  Alcohol Use  . 0.6 oz/week  . 1 Cans of beer per week    Comment: drinks 2 times a week     History  Drug Use  . Yes  . Special: Marijuana, Cocaine    Comment: Lst used 3 days ago    Social History   Social History  . Marital Status: Single    Spouse Name: N/A  . Number of Children: N/A  . Years of Education: N/A   Social History Main Topics  . Smoking status: Current Every Day Smoker -- 0.50 packs/day    Types: Cigarettes  . Smokeless tobacco: Never Used  . Alcohol Use: 0.6 oz/week    1 Cans of beer per week     Comment: drinks 2 times a week  . Drug Use: Yes    Special: Marijuana, Cocaine     Comment: Lst used 3 days ago  . Sexual Activity: No   Other Topics Concern  . None   Social History Narrative   Additional Social History:    Pain Medications: Denies abuse Prescriptions: Denies abuse Over the Counter: Denies abuse History of alcohol / drug use?: Yes Longest period of sobriety (when/how long): Unknown Negative Consequences of Use: Financial, Personal relationships, Work / School Name of Substance 1: Alcohol 1 - Age of First Use: Adolescent 1 - Amount (size/oz): 6-12 cans of beer 1 - Frequency: Daily when available 1 - Duration: Ongoing for  years 1 - Last Use / Amount: 10/06/15, six beers Name of Substance 2: Cocaine 2 - Age of First Use: 22 2 - Amount (size/oz): Varies 2 - Frequency: Daily when available 2 - Duration: Ongoing for years 2 - Last Use / Amount: 10/06/15, $20 worth Name of Substance 3: Marijuana 3 - Age of First Use: Adolescent 3 - Amount (size/oz): varies 3 - Frequency: Daily when available 3 - Duration: Ongoing for years 3 - Last Use / Amount: 10/06/15  Sleep:  Improved   Appetite:   Improved   Current Medications: Current Facility-Administered Medications  Medication Dose Route Frequency Provider Last Rate Last Dose  . acetaminophen (TYLENOL) tablet 650 mg   650 mg Oral Q6H PRN Laverle Hobby, PA-C   650 mg at 10/12/15 0843  . alum & mag hydroxide-simeth (MAALOX/MYLANTA) 200-200-20 MG/5ML suspension 30 mL  30 mL Oral Q4H PRN Laverle Hobby, PA-C   30 mL at 10/14/15 1712  . ARIPiprazole (ABILIFY) tablet 5 mg  5 mg Oral Daily Jenne Campus, MD   5 mg at 10/14/15 0810  . FLUoxetine (PROZAC) capsule 20 mg  20 mg Oral Daily Jenne Campus, MD      . hydrOXYzine (ATARAX/VISTARIL) tablet 25 mg  25 mg Oral Q6H PRN Laverle Hobby, PA-C      . magnesium hydroxide (MILK OF MAGNESIA) suspension 30 mL  30 mL Oral Daily PRN Laverle Hobby, PA-C      . nicotine (NICODERM CQ - dosed in mg/24 hours) patch 21 mg  21 mg Transdermal Daily Jenne Campus, MD   21 mg at 10/09/15 1813  . traZODone (DESYREL) tablet 100 mg  100 mg Oral QHS Derrill Center, NP   100 mg at 10/14/15 2237    Lab Results:  No results found for this or any previous visit (from the past 48 hour(s)).  Physical Findings: AIMS: Facial and Oral Movements Muscles of Facial Expression: None, normal Lips and Perioral Area: None, normal Jaw: None, normal Tongue: None, normal,Extremity Movements Upper (arms, wrists, hands, fingers): None, normal Lower (legs, knees, ankles, toes): None, normal, Trunk Movements Neck, shoulders, hips: None, normal, Overall Severity Severity of abnormal movements (highest score from questions above): None, normal Incapacitation due to abnormal movements: None, normal Patient's awareness of abnormal movements (rate only patient's report): No Awareness, Dental Status Current problems with teeth and/or dentures?: No Does patient usually wear dentures?: No  CIWA:  CIWA-Ar Total: 0 COWS:  COWS Total Score: 0  Musculoskeletal: Strength & Muscle Tone: within normal limits Gait & Station: normal Patient leans: N/A  Psychiatric Specialty Exam: Review of Systems  Psychiatric/Behavioral: Positive for depression and substance abuse. Negative for suicidal ideas.  The patient is nervous/anxious and has insomnia.   All other systems reviewed and are negative.  no chest pain, no SOB, no rash, no lower extremity edema or pain endorsed at this time  Blood pressure 140/96, pulse 84, temperature 97.5 F (36.4 C), temperature source Oral, resp. rate 16, height 5' 7.25" (1.708 m), weight 237 lb (107.502 kg), SpO2 98 %.Body mass index is 36.85 kg/(m^2). repeat BP 128/90  General Appearance:   Grooming has improved compared to admission  Eye Contact::  Good  Speech:  Normal Rate  Volume:   Normal   Mood: gradually improving  Affect:   Less constricted, smiles at times appropriately   Thought Process:  Linear  Orientation:  Full (Time, Place, and Person)  Thought Content:  No hallucinations  at this time and not internally preoccupied, no delusions   Suicidal Thoughts:   Denies any current suicidal or self injurious ideations   Homicidal Thoughts:  No  Memory:  recent and remote grossly intact   Judgement:   Improving   Insight:  Present  Psychomotor Activity:  Normal   Concentration:  Good  Recall:  Good  Fund of Knowledge:Good  Language: Good  Akathisia:  Negative  Handed:  Right  AIMS (if indicated):     Assets:  Desire for Improvement Resilience  ADL's:  Intact  Cognition: WNL  Sleep:  Number of Hours: 6.5   Assessment -  At this time patient reporting improving mood and presenting with an improved range of affect. More future oriented, and working on disposition options- at present wanting to go live with son. Yesterday he had reported headaches related to medications, but states this now improved and currently not endorsing any headache or other side effect. Abilify now at 5 mgrs QDAY and has had no recurrence of auditory hallucinations .  Treatment Plan Summary: Daily contact with patient to assess and evaluate symptoms and progress in treatment, Medication management, Plan inpatient treatment  and medications as below    Encourage group,  milieu participation to work on coping skills and symptom reduction Continue Prozac  20 mgrs QDAY for depression. Continue Abilify 5 mgrs QDAY for mood disorder and psychotic symptoms Continue  Trazodone 100 mgrs QHS PRN for insomnia as needed  Treatment team working on disposition planning options    Neita Garnet , MD   10/15/2015, 9:29 AM

## 2015-10-15 NOTE — Progress Notes (Signed)
D-  Patient has been in the day room participating in groups, engaged in unit activities.  Patient reports having a good mood feeling less depressed and anxious.  Patient denies SI, HI and AVH.  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   A-  Assess patient for safety, offer medications as prescribed  R-  Continue to monitor as prescribed.

## 2015-10-15 NOTE — Progress Notes (Signed)
D:Patient in the dayroom on approach.  Patient states he had a good day.  Patient states he hopes to discharge as soon as possible tomorrow.  Patient states he has a housing option but has to meet to pay for it by 11:00am.  Patietn denies SI/HI and denies AVH.  Patient has brighter affect today.  Patient rates anxiety and depression 5/10. A: Staff to monitor Q 15 mins for safety.  Encouragement and support offered.  Scheduled medications administered per orders. R: Patient remains safe on the unit.  Patient attended group tonight.  Patient visible on the unit.  Patient taking administered medications.

## 2015-10-15 NOTE — Progress Notes (Signed)
Adult Psychoeducational Group Note  Date:  10/15/2015 Time:  8:29 PM  Group Topic/Focus:  Wrap-Up Group:   The focus of this group is to help patients review their daily goal of treatment and discuss progress on daily workbooks.  Participation Level:  Active  Participation Quality:  Appropriate  Affect:  Appropriate  Cognitive:  Appropriate  Insight: Appropriate  Engagement in Group:  Engaged  Modes of Intervention:  Discussion  Additional Comments:  Pt was pleasant during wrap-up group. Pt rated his overall day a 7 out of 10 because "it was jsut a 7 day", pt could not really explain why his day was rated at a 7. Pt reported that he did not have a goal for the day.   Lincoln Brigham 10/15/2015, 9:34 PM

## 2015-10-15 NOTE — Progress Notes (Signed)
Pt attended spiritual care group on grief and loss facilitated by chaplain Annaleah Arata   Group opened with brief discussion and psycho-social ed around grief and loss in relationships and in relation to self - identifying life patterns, circumstances, changes that cause losses. Established group norm of speaking from own life experience. Group goal of establishing open and affirming space for members to share loss and experience with grief, normalize grief experience and provide psycho social education and grief support.     

## 2015-10-15 NOTE — [facility] Group Notes (Signed)
[facility] Group Notes:  (Nursing/MHT/Case Management/Adjunct)  Date:  10/15/2015  Time:  12:52 PM  Type of Therapy:  Psychoeducational Skills  Participation Level:  Minimal  Participation Quality:  Inattentive  Affect:  Blunted  Cognitive:  Appropriate  Insight:  None  Engagement in Group:  Poor  Modes of Intervention:  Discussion and Education  Summary of Progress/Problems: Patient attended group and received daily booklet.   Gaylan Gerold E 10/15/2015, 12:52 PM

## 2015-10-15 NOTE — [facility] Group Notes (Signed)
[facility] LCSW Group Therapy 10/15/2015 1:15 PM  Type of Therapy: Group Therapy- Feelings about Diagnosis  Pt came to group but was observed to be sleeping and disengaged from group discussion. Pt did not participate.  Peri Maris, [place] 10/15/2015 4:22 PM

## 2015-10-15 NOTE — Plan of Care (Signed)
Problem: Diagnosis: Increased Risk For Suicide Attempt Goal: STG-Patient Will Attend All Groups On The Unit Outcome: Progressing Patient attended group on the unit tonight.     

## 2015-10-16 DIAGNOSIS — F323 Major depressive disorder, single episode, severe with psychotic features: Secondary | ICD-10-CM | POA: Insufficient documentation

## 2015-10-16 MED ORDER — HYDROXYZINE HCL 25 MG PO TABS: 25.0000 mg | ORAL_TABLET | Freq: Four times a day (QID) | ORAL | Status: DC | PRN

## 2015-10-16 MED ORDER — NICOTINE 21 MG/24HR TD PT24: 21.0000 mg | MEDICATED_PATCH | Freq: Every day | TRANSDERMAL | Status: DC

## 2015-10-16 MED ORDER — TRAZODONE HCL 100 MG PO TABS: 100.0000 mg | ORAL_TABLET | Freq: Every day | ORAL | Status: DC

## 2015-10-16 MED ORDER — ARIPIPRAZOLE 5 MG PO TABS: 5.0000 mg | ORAL_TABLET | Freq: Every day | ORAL | Status: DC

## 2015-10-16 MED ORDER — FLUOXETINE HCL 20 MG PO CAPS: 20.0000 mg | ORAL_CAPSULE | Freq: Every day | ORAL | Status: DC

## 2015-10-16 NOTE — [facility] Suicide Risk Assessment (Addendum)
[facility] Discharge Suicide Risk Assessment   Principal Problem: <principal problem not specified> Discharge Diagnoses:  Patient Active Problem List   Diagnosis Date Noted  . Substance induced mood disorder ([place]) [F19.94] 10/08/2015  . Colostomy complication ([place]) [id number]   . Colon cancer s/p chemo + radiation and resection. Now with colostomy bag [C18.9] 04/10/2015  . SVT (supraventricular tachycardia) ([place]) [I47.1] 04/10/2015  . Tobacco abuse [Z72.0] 04/10/2015    Total Time spent with patient: 30 minutes  Musculoskeletal: Strength & Muscle Tone: within normal limits Gait & Station: normal Patient leans: N/A  Psychiatric Specialty Exam: ROS  Blood pressure 123/96, pulse 93, temperature 97.7 F (36.5 C), temperature source Oral, resp. rate 16, height 5' 7.25" (1.708 m), weight 237 lb (107.502 kg), SpO2 98 %.Body mass index is 36.85 kg/(m^2).  General Appearance: Well Groomed  Eye Contact::  Good  Speech:  Normal Rate409  Volume:  Normal  Mood:  euthymic,denies depression at this time  Affect:  Appropriate  Thought Process:  Linear  Orientation:  Full (Time, Place, and Person)  Thought Content:  denies hallucinations, no delusions, not internally preoccupied   Suicidal Thoughts:  No- denies any suicidal ideations, denies any homicidal ideations   Homicidal Thoughts:  No  Memory:  recent and remote grossly intact  Judgement:  improved  Insight:  improved   Psychomotor Activity:  Normal  Concentration:  Good  Recall:  Good  Fund of Knowledge:Good  Language: Good  Akathisia:  Negative  Handed:  Left  AIMS (if indicated):     Assets:  Desire for Improvement Resilience  Sleep:  Number of Hours: 6.5  Cognition: WNL  ADL's:  Intact   Mental Status Per Nursing Assessment::   On Admission:     Demographic Factors:  41  Year single male, has two children  Loss Factors: Disability , history of colon cancer, colostomy  Historical Factors: History of depression, history  of cocaine abuse   Risk Reduction Factors:   Sense of responsibility to family, children  Continued Clinical Symptoms:  At this time patient improved- mood improved , today euthymic, affect appropriate, no thought disorder, no SI or HI, no psychotic symptoms   Cognitive Features That Contribute To Risk:  No gross cognitive deficits noted upon discharge. Is alert , attentive, and oriented x 3   Suicide Risk:  Mild:  Suicidal ideation of limited frequency, intensity, duration, and specificity.  There are no identifiable plans, no associated intent, mild dysphoria and related symptoms, good self-control (both objective and subjective assessment), few other risk factors, and identifiable protective factors, including available and accessible social support.  Follow-up Information    Follow up with [facility].   Specialty:  Behavioral Health   Why:  Please walk-in between 8am-3pm Monday-Friday within 7 days of your discharge to be seen for your initial assessment for medication management and therapy.   Contact information:   [place] [address] [phone]       Plan Of Care/Follow-up recommendations:  Activity:  as tolerated  Diet:  heaert healthy Tests:  NA Other:  see below  At this time patient leaving in good spirits . Patient has an established PCP for medical issues - we discussed elevated BP- patient states BP normally WNL, but elevated due to anxiety related to discharge- does plan to see PCP for ongoing monitoring and treatment if needed .  Neita Garnet, MD 10/16/2015, 9:43 AM

## 2015-10-16 NOTE — Progress Notes (Signed)
Patient ID: Richard [name], male   DOB: 08/16/1966, 50 y.o.   MRN: [id number] Patient discharged in the company of self to home/self care.  Upon discharge patient was bright, smiling and excited to leave.  Patient acknowledged understanding of discharge instructions and stated that all of his belongings had been returned.  Patient was able to contract for safety and denies SI, HI and AVH.

## 2015-10-16 NOTE — Discharge Summary (Signed)
Physician Discharge Summary Note  Patient:  Richard [name] is an 50 y.o., male MRN:  [id number] DOB:  03/17/1966 Patient phone:  [phone] (home)  Patient address:   [address] [place] [address],  Total Time spent with patient: Greater than 30 minutes  Date of Admission:  10/08/2015 Date of Discharge: 10-16-15  Reason for Admission: Worsening symptoms of depression/suicidal ideations  Principal Problem: <principal problem not specified>  Discharge Diagnoses: Patient Active Problem List   Diagnosis Date Noted  . Severe single current episode of major depressive disorder, with psychotic features (HCC) [F32.3]   . Substance induced mood disorder (HCC) [F19.94] 10/08/2015  . Colostomy complication (HCC) [K94.00]   . Colon cancer s/p chemo + radiation and resection. Now with colostomy bag [C18.9] 04/10/2015  . SVT (supraventricular tachycardia) (HCC) [I47.1] 04/10/2015  . Tobacco abuse [Z72.0] 04/10/2015   Past Psychiatric History: Major depression  Past Medical History:  Past Medical History  Diagnosis Date  . Colon cancer (HCC)   . Colon cancer (HCC) 2015  . Colostomy complication (HCC) 09-13-15    blood in colostomy bag    Past Surgical History  Procedure Laterality Date  . Partial colectomy  06/2014    done at [facility]   Family History:  Family History  Problem Relation Age of Onset  . Coronary artery disease Cousin 50   Family Psychiatric  History: See H&P  Social History:  History  Alcohol Use  . 0.6 oz/week  . 1 Cans of beer per week    Comment: drinks 2 times a week     History  Drug Use  . Yes  . Special: Marijuana, Cocaine    Comment: Lst used 3 days ago    Social History   Social History  . Marital Status: Single    Spouse Name: N/A  . Number of Children: N/A  . Years of Education: N/A   Social History Main Topics  . Smoking status: Current Every Day Smoker -- 0.50 packs/day    Types: Cigarettes  . Smokeless tobacco: Never Used   . Alcohol Use: 0.6 oz/week    1 Cans of beer per week     Comment: drinks 2 times a week  . Drug Use: Yes    Special: Marijuana, Cocaine     Comment: Lst used 3 days ago  . Sexual Activity: No   Other Topics Concern  . None   Social History Narrative   Hospital Course: 50 year old man, who went to ED due to chest pain. He was medically cleared and was discharged, but upon leaving felt increasingly overwhelmed, depressed, and reported some suicidal ideations. Patient states he has been struggling with depression , particularly after being diagnosed with colon cancer in late 2015, leading to surgery and colostomy. States " I used to work building cars, and now I am on disability, and having the colostomy is depressing". He reports neuro-vegetative symptoms as below, and reports decreased sense of self esteem, which he attributes in part to feeling self conscious and embarrassed about colostomy. Patient reports vague auditory hallucinations,referred as " gibberish ", which he reports may coincide with cocaine use. Patient states he had been using cocaine , cannabis, regularly, which he states is partly as an effort to " escape my depression". He also reports binging on alcohol, up to 12 beers a day when he drinks. Last drank 3-4 days ago. Of note at this time is not presenting with any WDL symptoms.  Richard [name] was admitted   to the hospital with his UDS test reports showing positive Cocaine & THC. However, his reason for admission was worsening symptoms of depression requiring mood stabilization treatment. After evaluation of his symptoms, Richard [name] was started on medication regimen for his presenting symptoms. His medication regimen included; Abilify5 mg daily for mood control/adjunct treatment for depression, Prozac 20 mg daily for depression, Hydroxyzine 25 mg for anxiety & Trazodone 100 mg daily for sleep. He was also enrolled & participated in the group counseling sessions being offered and held on this  unit, he learned coping skills that should help him cope better & maintain mood stability after discharge. He presented no other significant pre-existing health issues that required treatment and or monitoring. He self cared his colostomy. He tolerated his treatment regimen without any significant adverse effects & or reactions.  Richard [name]'s symptoms were evaluated on daily basis by a clinical provider to ascertain his symptoms are responding to his treatment regimen. This is evidenced by his reports of decreasing symptoms, improved mood, sleep, appetite and presentation of good affect. He is currently being discharged to continue psychiatric treatment and medication management as noted below. He is provided with all the pertinent information required to make this appointment without problems.   On this day of his hospital discharge, Richard [name] is in much improved condition than upon admission. He contracted for his safety and felt more in control of his mood. His symptoms were reported as significantly decreased or resolved completely. He denies any SIHI & voiced no AVH. He is instructed & motivated to continue taking medications with a goal of continued improvement in mental health. Richard [name] was provided with some samples of his BHh discharge medications. He was picked up by his son. He left [facility] in no apparent distress with all belongings.  Physical Findings:  AIMS: Facial and Oral Movements Muscles of Facial Expression: None, normal Lips and Perioral Area: None, normal Jaw: None, normal Tongue: None, normal,Extremity Movements Upper (arms, wrists, hands, fingers): None, normal Lower (legs, knees, ankles, toes): None, normal, Trunk Movements Neck, shoulders, hips: None, normal, Overall Severity Severity of abnormal movements (highest score from questions above): None, normal Incapacitation due to abnormal movements: None, normal Patient's awareness of abnormal movements (rate only patient's report): No  Awareness, Dental Status Current problems with teeth and/or dentures?: No Does patient usually wear dentures?: No  CIWA:  CIWA-Ar Total: 0 COWS:  COWS Total Score: 0  Musculoskeletal: Strength & Muscle Tone: within normal limits Gait & Station: normal Patient leans: N/A  Psychiatric Specialty Exam: Review of Systems  Constitutional: Negative.   HENT: Negative.   Eyes: Negative.   Respiratory: Negative.   Cardiovascular: Negative.   Gastrointestinal: Negative.   Genitourinary: Negative.   Musculoskeletal: Negative.   Skin: Negative.   Neurological: Negative.   Endo/Heme/Allergies: Negative.   Psychiatric/Behavioral: Positive for depression (Stable), hallucinations (Hx or (auditory)) and substance abuse (Hx Alcohol, cocaine, tobacco THC abuse). Negative for suicidal ideas and memory loss. The patient has insomnia (Stable). The patient is not nervous/anxious.     Blood pressure 123/96, pulse 93, temperature 97.7 F (36.5 C), temperature source Oral, resp. rate 16, height 5' 7.25" (1.708 m), weight 107.502 kg (237 lb), SpO2 98 %.Body mass index is 36.85 kg/(m^2).  See Md's SRA   Have you used any form of tobacco in the last 30 days? (Cigarettes, Smokeless Tobacco, Cigars, and/or Pipes): Yes  Has this patient used any form of tobacco in the last 30 days? (Cigarettes, Smokeless Tobacco, Cigars, and/or Pipes): Yes,   A prescription for an FDA-approved tobacco cessation medication was offered at discharge and the patient refused  Metabolic Disorder Labs:  No results found for: HGBA1C, MPG No results found for: PROLACTIN No results found for: CHOL, TRIG, HDL, CHOLHDL, VLDL, LDLCALC  See Psychiatric Specialty Exam and Suicide Risk Assessment completed by Attending Physician prior to discharge.  Discharge destination:  Home  Is patient on multiple antipsychotic therapies at discharge:  No   Has Patient had three or more failed trials of antipsychotic monotherapy by history:   No  Recommended Plan for Multiple Antipsychotic Therapies: NA    Medication List    STOP taking these medications        oxyCODONE-acetaminophen 5-325 MG tablet  Commonly known as:  PERCOCET      TAKE these medications      Indication   ARIPiprazole 5 MG tablet  Commonly known as:  ABILIFY  Take 1 tablet (5 mg total) by mouth daily. For mood control   Indication:  Mood control     FLUoxetine 20 MG capsule  Commonly known as:  PROZAC  Take 1 capsule (20 mg total) by mouth daily. For depression   Indication:  Major Depressive Disorder     hydrOXYzine 25 MG tablet  Commonly known as:  ATARAX/VISTARIL  Take 1 tablet (25 mg total) by mouth every 6 (six) hours as needed for anxiety.   Indication:  Anxiety     nicotine 21 mg/24hr patch  Commonly known as:  NICODERM CQ - dosed in mg/24 hours  Place 1 patch (21 mg total) onto the skin daily. For smoking cessation   Indication:  Nicotine Addiction     traZODone 100 MG tablet  Commonly known as:  DESYREL  Take 1 tablet (100 mg total) by mouth at bedtime. For sleep   Indication:  Trouble Sleeping       Follow-up Information    Follow up with [facility].   Specialty:  Behavioral Health   Why:  Please walk-in between 8am-3pm Monday-Friday within 7 days of your discharge to be seen for your initial assessment for medication management and therapy.   Contact information:   [place] [address] [phone]      Follow-up recommendations: Activity:  As tolerated Diet: As recommended by your primary care doctor. Keep all scheduled follow-up appointments as recommended.    Comments: Take all your medications as prescribed by your mental healthcare provider. Report any adverse effects and or reactions from your medicines to your outpatient provider promptly. Patient is instructed and cautioned to not engage in alcohol and or illegal drug use while on prescription medicines. In the event of worsening symptoms,  patient is instructed to call the crisis hotline, 911 and or go to the nearest ED for appropriate evaluation and treatment of symptoms. Follow-up with your primary care provider for your other medical issues, concerns and or health care needs.   Signed: Encarnacion Slates, NP, PMHNP, FNP_BC 10/16/2015, 11:19 AM  I personally assessed the patient and formulated the plan Geralyn Flash A. Sabra Heck, M.D.

## 2015-10-16 NOTE — Tx Team (Signed)
Interdisciplinary Treatment Plan Update (Adult) Date: 10/16/2015   Date: 10/16/2015 9:53 AM  Progress in Treatment:  Attending groups: Yes  Participating in groups: Yes  Taking medication as prescribed: Yes  Tolerating medication: Yes  Family/Significant othe contact made: No, Pt declines Patient understands diagnosis: Yes, AEB seeking help with depression and substance abuse Discussing patient identified problems/goals with staff: Yes  Medical problems stabilized or resolved: Yes  Denies suicidal/homicidal ideation: Yes Patient has not harmed self or Others: Yes   New problem(s) identified: None identified at this time.   Discharge Plan or Barriers: CSW will assess for appropriate discharge plan and relevant barriers.   10/11/15: [facility] referral made today.  10/16/15: Pt will discharge to new apartment and follow-up with [facility]  Additional comments:  Patient and CSW reviewed pt's identified goals and treatment plan. Patient verbalized understanding and agreed to treatment plan. CSW reviewed [facility] "Discharge Process and Patient Involvement" Form. Pt verbalized understanding of information provided and signed form.   Reason for Continuation of Hospitalization:  Depression Medication stabilization Suicidal ideation  Estimated length of stay: 0 days; Pt stable for DC today  Review of initial/current patient goals per problem list:   1.  Goal(s): Patient will participate in aftercare plan  Met:  Yes  Target date: 3-5 days from date of admission   As evidenced by: Patient will participate within aftercare plan AEB aftercare provider and housing plan at discharge being identified.  10/08/15: CSW to work with Pt to assess for appropriate discharge plan and faciliate appointments and referrals as needed prior to d/c. 10/11/15: Referrals continuing to be made; Pt undecided about discharge plan at this time. 10/16/15: Pt discharging to new apartment and will follow-up with [facility]  2.  Goal  (s): Patient will exhibit decreased depressive symptoms and suicidal ideations.  Met:  Yes  Target date: 3-5 days from date of admission   As evidenced by: Patient will utilize self rating of depression at 3 or below and demonstrate decreased signs of depression or be deemed stable for discharge by MD. 10/08/15: Pt was admitted with symptoms of depression, rating 10/10. Pt continues to present with flat affect and depressive symptoms.  Pt will demonstrate decreased symptoms of depression and rate depression at 3/10 or lower prior to discharge. 10/11/15: Pt continues to rate depression at high levels. Observed to have flat affect. 10/16/15: Pt rates depression at 1/10; denies SI  Attendees:  Patient:    Family:    Physician: Dr. Parke Poisson, MD  10/16/2015 9:53 AM  Nursing:   10/16/2015 9:53 AM  Clinical Social Worker Peri Maris, [place] 10/16/2015 9:53 AM  Other: Tilden Fossa, [place] 10/16/2015 9:53 AM  Clinical: Idell Pickles, RN; Darrol Angel, RN 10/16/2015 9:53 AM  Other: , RN Charge Nurse 10/16/2015 9:53 AM  Other:     Peri Maris, [place] [facility] [phone]

## 2015-10-16 NOTE — Progress Notes (Signed)
  [facility] Adult Case Management Discharge Plan :  Will you be returning to the same living situation after discharge:  No. Pt will be moving into a new apartment At discharge, do you have transportation home?: Yes,  Pt son to provide transportation Do you have the ability to pay for your medications: Yes,  Pt provided with 30-day prescriptions  Release of information consent forms completed and in the chart;  Patient's signature needed at discharge.  Patient to Follow up at: Follow-up Information    Follow up with [facility].   Specialty:  Behavioral Health   Why:  Please walk-in between 8am-3pm Monday-Friday within 7 days of your discharge to be seen for your initial assessment for medication management and therapy.   Contact information:   [place] [address] [phone]       Next level of care provider has access to [place] and Suicide Prevention discussed: Yes,  with Pt; declined family contact  Have you used any form of tobacco in the last 30 days? (Cigarettes, Smokeless Tobacco, Cigars, and/or Pipes): Yes  Has patient been referred to the Quitline?: Patient refused referral  Patient has been referred for addiction treatment: Yes- see above  Bo Mcclintock 10/16/2015, 9:52 AM

## 2015-10-17 ENCOUNTER — Encounter (HOSPITAL_COMMUNITY): Payer: Self-pay | Admitting: *Deleted

## 2015-10-17 ENCOUNTER — Inpatient Hospital Stay (HOSPITAL_COMMUNITY)
Admission: RE | Admit: 2015-10-17 | Discharge: 2015-10-23 | DRG: 885 | Disposition: A | Discharge status: Rehabilitation Facility | Payer: Medicaid Other | Attending: Psychiatry | Admitting: Psychiatry

## 2015-10-17 DIAGNOSIS — F332 Major depressive disorder, recurrent severe without psychotic features: Principal | ICD-10-CM | POA: Diagnosis present

## 2015-10-17 DIAGNOSIS — F1410 Cocaine abuse, uncomplicated: Secondary | ICD-10-CM | POA: Diagnosis present

## 2015-10-17 DIAGNOSIS — Z923 Personal history of irradiation: Secondary | ICD-10-CM

## 2015-10-17 DIAGNOSIS — R45850 Homicidal ideations: Secondary | ICD-10-CM | POA: Diagnosis present

## 2015-10-17 DIAGNOSIS — Z9221 Personal history of antineoplastic chemotherapy: Secondary | ICD-10-CM | POA: Diagnosis not present

## 2015-10-17 DIAGNOSIS — Z85038 Personal history of other malignant neoplasm of large intestine: Secondary | ICD-10-CM

## 2015-10-17 DIAGNOSIS — F1210 Cannabis abuse, uncomplicated: Secondary | ICD-10-CM | POA: Diagnosis present

## 2015-10-17 DIAGNOSIS — Z9049 Acquired absence of other specified parts of digestive tract: Secondary | ICD-10-CM | POA: Diagnosis not present

## 2015-10-17 DIAGNOSIS — Z8249 Family history of ischemic heart disease and other diseases of the circulatory system: Secondary | ICD-10-CM | POA: Diagnosis not present

## 2015-10-17 DIAGNOSIS — F1010 Alcohol abuse, uncomplicated: Secondary | ICD-10-CM | POA: Diagnosis not present

## 2015-10-17 DIAGNOSIS — Z933 Colostomy status: Secondary | ICD-10-CM | POA: Diagnosis not present

## 2015-10-17 DIAGNOSIS — F17210 Nicotine dependence, cigarettes, uncomplicated: Secondary | ICD-10-CM | POA: Diagnosis present

## 2015-10-17 DIAGNOSIS — F419 Anxiety disorder, unspecified: Secondary | ICD-10-CM | POA: Diagnosis present

## 2015-10-17 DIAGNOSIS — R45851 Suicidal ideations: Secondary | ICD-10-CM | POA: Diagnosis present

## 2015-10-17 DIAGNOSIS — G4700 Insomnia, unspecified: Secondary | ICD-10-CM | POA: Diagnosis present

## 2015-10-17 MED ORDER — ALUM & MAG HYDROXIDE-SIMETH 200-200-20 MG/5ML PO SUSP
30.0000 mL | ORAL | Status: DC | PRN
  Administered 2015-10-19 – 2015-10-20 (×2): 30 mL via ORAL
  Filled 2015-10-17 (×2): qty 30

## 2015-10-17 MED ORDER — FLUOXETINE HCL 20 MG PO CAPS
20.0000 mg | ORAL_CAPSULE | Freq: Every day | ORAL | Status: DC
  Administered 2015-10-18 – 2015-10-23 (×6): 20 mg via ORAL
  Filled 2015-10-17 (×8): qty 1
  Filled 2015-10-17: qty 14

## 2015-10-17 MED ORDER — ACETAMINOPHEN 325 MG PO TABS
650.0000 mg | ORAL_TABLET | Freq: Four times a day (QID) | ORAL | Status: DC | PRN
  Administered 2015-10-23: 650 mg via ORAL
  Filled 2015-10-17: qty 2

## 2015-10-17 MED ORDER — MAGNESIUM HYDROXIDE 400 MG/5ML PO SUSP: 30.0000 mL | Freq: Every day | ORAL | Status: DC | PRN

## 2015-10-17 MED ORDER — TRAZODONE HCL 100 MG PO TABS
100.0000 mg | ORAL_TABLET | Freq: Every day | ORAL | Status: DC
  Administered 2015-10-19 – 2015-10-22 (×3): 100 mg via ORAL
  Filled 2015-10-17: qty 1
  Filled 2015-10-17: qty 14
  Filled 2015-10-17 (×6): qty 1

## 2015-10-17 MED ORDER — ARIPIPRAZOLE 5 MG PO TABS
5.0000 mg | ORAL_TABLET | Freq: Every day | ORAL | Status: DC
Start: 2015-10-18 — End: 2015-10-23
  Administered 2015-10-18 – 2015-10-23 (×6): 5 mg via ORAL
  Filled 2015-10-17 (×6): qty 1
  Filled 2015-10-17: qty 14
  Filled 2015-10-17 (×2): qty 1

## 2015-10-17 MED ORDER — HYDROXYZINE HCL 25 MG PO TABS
25.0000 mg | ORAL_TABLET | Freq: Four times a day (QID) | ORAL | Status: DC | PRN
  Filled 2015-10-17: qty 20
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
## 2015-10-17 NOTE — BH Assessment (Addendum)
Tele Assessment Note   Richard [name] is an 50 y.o. male presenting as a walk-in to [facility] for worsening depression, along with SI/HI. His son reportedly brought him to the ED because he was worried about him. Pt was just discharged from [facility] yesterday but states that he learned of his best friend's murder just 4 hours after leaving [facility]. He states that he knows the man who murdered his friend because he was a mutual friend of the two. Pt reports feeling angry, confused, and betrayed. He has been having thoughts of completing a murder-suicide, killing his friend's murderer and then taking his own life. However, he says he has no plan or intent to act on these thoughts. Pt says "It took a lot for me to come back here [to [place], but I'm scared of what I'll do". Pt goes on to say that he's been hurting so badly that he relapsed and used cocaine, marijuana, and alcohol since he was discharged yesterday. Pt reports a long hx of substance abuse and went to [facility] for treatment in 2016. Pt also c/o insomnia, hopelessness, fatigue, guilt, crying spells, increased anger, anhedonia, social isolation, and lack of motivation. His only hx of psychiatric hospitalization is his admission to [facility] last month.  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 He is now homeless and stays with friends when possible. He reports financial stressors, housing problems, and family stressors. He has never seen a psychiatrist or counselor for treatment. He adds that he never followed-up with mental health tx and never filled the meds prescribed by [facility] on his last admission because he had no time or funds to do so (he was just d/c on 10/08/15); therefore, he has not been taking any psychiatric medication since his d/c. He denies having any social supports and denies any hx of physical, emotional, or sexual abuse. He denies A/VH and psychotic sx.  Pt presents with depressed mood and congruent affect. Eye  contact is fair and hygiene appears poor. Thought process is linear and relevant with no indication of delusional content. Speech is soft but coherent. He does not appear to be responding to any internal stimuli. Motor activity is normal. Pt is oriented x4 and cooperative with assessment process. He is requesting inpatient treatment at [facility].  Disposition: Per Arlester Marker, NP, Pt meets inpt tx criteria. Pt accepted to [facility] bed 303-1 under the care of Dr. Sabra Heck.  Diagnosis:  296.33 Major depressive disorder, Recurrent, Severe 304.20 Cocaine use disorder, Moderate to Severe 303.90 Alcohol use disorder, Moderate 304.30 Cannabis use disorder, Moderate  Past Medical History:  Past Medical History  Diagnosis Date  . Colon cancer ([place])   . Colon cancer ([place]) 2015  . 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 Partial colectomy  06/2014    done at [facility]    Family History:  Family History  Problem Relation Age of Onset  . Coronary artery disease Cousin 72    Social History:  reports that he has been smoking Cigarettes.  He has been smoking about 0.50 packs per day. He has never used smokeless tobacco. He reports that he drinks about 0.6 oz of alcohol per week. He reports that he uses illicit drugs (Marijuana and Cocaine).  Additional Social History:  Alcohol / Drug Use Pain Medications: See PTA med list Prescriptions: See PTA med list Over  the Counter: See PTA med list History of alcohol / drug use?: Yes Longest period of sobriety (when/how long): Was sober for 1 week after discharge from [facility] on 10/08/15 Negative Consequences of Use: Financial, Personal relationships, Work / Youth worker, Scientist, research (physical sciences) Withdrawal Symptoms:  (Denies) Substance #1 Name of Substance 1: Alcohol 1 - Age of First Use: Adolescent 1 - Amount (size/oz): 6-12 cans of beer 1 - Frequency: Daily when available 1 - Duration: Ongoing for years 1 - Last  Use / Amount: 10/17/15, several beers Substance #2 Name of Substance 2: Cocaine 2 - Age of First Use: 22 2 - Amount (size/oz): Varies 2 - Frequency: Daily when available 2 - Duration: Ongoing for years 2 - Last Use / Amount: 10/17/15, $20 worth Substance #3 Name of Substance 3: Marijuana 3 - Age of First Use: Adolescent 3 - Amount (size/oz): varies 3 - Frequency: Daily when available 3 - Duration: Ongoing for years 3 - Last Use / Amount: 10/17/15, amount unknown  CIWA:   COWS:    PATIENT STRENGTHS: (choose at least two) Ability for insight Average or above average intelligence Communication skills  Allergies:  Allergies  Allergen Reactions  . Morphine And Related Itching    Home Medications:  (Not in a hospital admission)  OB/GYN Status:  No LMP for male patient.  General Assessment Data Location of Assessment: [facility] TTS Assessment: In system Is this a Tele or Face-to-Face Assessment?: Face-to-Face Is this an Initial Assessment or a Re-assessment for this encounter?: Initial Assessment Marital status: Single Maiden name: na Is patient pregnant?: No Pregnancy Status: No Living Arrangements: Other (Comment) (Homeless (or stays w/friends when possible)) Can pt return to current living arrangement?: Yes Admission Status: Voluntary Is patient capable of signing voluntary admission?: Yes Referral Source: Self/Family/Friend Insurance type: [employer] Living Arrangements: Other (Comment) (Homeless (or stays w/friends when possible)) Legal Guardian:  (None) Name of Psychiatrist: None Name of Therapist: None  Education Status Is patient currently in school?: No Current Grade: na Highest grade of school patient has completed: Some college Name of school: na Contact person: na  Risk to self with the past 6 months Suicidal Ideation: Yes-Currently Present Has patient been a risk to self within the past 6 months  prior to admission? : Yes Suicidal Intent: No Has patient had any suicidal intent within the past 6 months prior to admission? : No Is patient at risk for suicide?: Yes Suicidal Plan?: Yes-Currently Present Has patient had any suicidal plan within the past 6 months prior to admission? : No Specify Current Suicidal Plan: Murder-suicide ([employer] friend's murderer and then self) Access to Means: Yes Specify Access to Suicidal Means: Access to firearms What has been your use of drugs/alcohol within the last 12 months?: Etoh, marijuana, cocaine use Previous Attempts/Gestures: No How many times?: 0 Other Self Harm Risks: SA Triggers for Past Attempts: None known Intentional Self Injurious Behavior: None Family Suicide History: No Recent stressful life event(s): Loss (Comment), Job Loss, Financial Problems, Recent negative physical changes Persecutory voices/beliefs?: No Depression: Yes Depression Symptoms: Despondent, Insomnia, Tearfulness, Isolating, Fatigue, Guilt, Loss of interest in usual pleasures, Feeling worthless/self pity, Feeling angry/irritable Substance abuse history and/or treatment for substance abuse?: Yes Suicide prevention information given to non-admitted patients: Not applicable  Risk to Others within the past 6 months Homicidal Ideation: Yes-Currently Present Does patient have any lifetime risk of violence toward others beyond the six months prior to admission? : No Thoughts of  Harm to Others: Yes-Currently Present Comment - Thoughts of Harm to Others: Thoughts of harming best friend's murderer Current Homicidal Intent: No Current Homicidal Plan: Yes-Currently Present Describe Current Homicidal Plan: Murder-suicide (Kill best friend's murderer and then self) Access to Homicidal Means: Yes Describe Access to Homicidal Means: Could gain access to firearms, if needed Identified Victim: Best friend's murderer History of harm to others?: No Assessment of Violence: None  Noted Violent Behavior Description: Pt denies hx of violence but has been having violent thoughts towards person who murdered his best friend Does patient have access to weapons?: Yes (Comment) (No personal access but could gain access if he needed to) Criminal Charges Pending?: No Does patient have a court date: No Is patient on probation?: No  Psychosis Hallucinations: None noted Delusions: None noted  Mental Status Report Appearance/Hygiene: Poor hygiene Eye Contact: Fair Motor Activity: Freedom of movement Speech: Logical/coherent, Soft Level of Consciousness: Quiet/awake Mood: Depressed Affect: Depressed Anxiety Level: Minimal Thought Processes: Coherent, Relevant Judgement: Partial Orientation: Person, Place, Time, Situation Obsessive Compulsive Thoughts/Behaviors: None  Cognitive Functioning Concentration: Good Memory: Recent Intact IQ: Average Insight: Fair Impulse Control: Fair Appetite: Good Weight Loss: 0 Weight Gain: 0 Sleep: Decreased Total Hours of Sleep: 3 Vegetative Symptoms: Decreased grooming  ADLScreening [facility] Assessment Services) Patient's cognitive ability adequate to safely complete daily activities?: Yes Patient able to express need for assistance with ADLs?: Yes Independently performs ADLs?: Yes (appropriate for developmental age)  Prior Inpatient Therapy Prior Inpatient Therapy: Yes Prior Therapy Dates: 09/2015, 2016 Prior Therapy Facilty/Provider(s): [facility], [facility] Reason for Treatment: SI, Depression, SA  Prior Outpatient Therapy Prior Outpatient Therapy: No Prior Therapy Dates: na Prior Therapy Facilty/Provider(s): na Reason for Treatment: na Does patient have an ACCT team?: No Does patient have Intensive In-House Services?  : No Does patient have [facility] services? : No Does patient have P4CC services?: No  ADL Screening (condition at time of admission) Patient's cognitive ability adequate to safely complete daily activities?:  Yes Is the patient deaf or have difficulty hearing?: No Does the patient have difficulty seeing, even when wearing glasses/contacts?: No Does the patient have difficulty concentrating, remembering, or making decisions?: No Patient able to express need for assistance with ADLs?: Yes Does the patient have difficulty dressing or bathing?: No Independently performs ADLs?: Yes (appropriate for developmental age) Does the patient have difficulty walking or climbing stairs?: No Weakness of Legs: None Weakness of Arms/Hands: None  Home Assistive Devices/Equipment Home Assistive Devices/Equipment: Other (Comment) (colostomy bag)    Abuse/Neglect Assessment (Assessment to be complete while patient is alone) Physical Abuse: Denies Verbal Abuse: Denies Sexual Abuse: Denies Self-Neglect: Denies Values / Beliefs Cultural Requests During Hospitalization: None Spiritual Requests During Hospitalization: None   Advance Directives (For Healthcare) Does patient have an advance directive?: No Would patient like information on creating an advanced directive?: No - patient declined information    Additional Information 1:1 In Past 12 Months?: No CIRT Risk: No Elopement Risk: No Does patient have medical clearance?: No     Disposition: Per Arlester Marker, NP, Pt meets inpt tx criteria. Pt accepted to [facility] bed 303-1 under the care of Dr. Sabra Heck. Disposition Initial Assessment Completed for this Encounter: Yes Disposition of Patient: Inpatient treatment program Type of inpatient treatment program: Adult  Ramond Dial, [facility]  10/17/2015 9:58 PM

## 2015-10-17 NOTE — Progress Notes (Signed)
50 year old male pt admitted voluntarily as a walk-in. Pt reports that he has had a difficult time since being discharged and cites the death of his friend as the reason he is here. Pt reports feeling depressed, suicidal and homicidal and reports he does not know what to do. Pt is able to verbally contract for safety while in the hospital. Pt was oriented to the unit and safety maintained.

## 2015-10-17 NOTE — Tx Team (Signed)
Initial Interdisciplinary Treatment Plan   PATIENT STRESSORS: Loss of best friend Traumatic event   PATIENT STRENGTHS: Ability for insight Active sense of humor Average or above average intelligence Capable of independent living General fund of knowledge Motivation for treatment/growth   PROBLEM LIST: Problem List/Patient Goals Date to be addressed Date deferred Reason deferred Estimated date of resolution  Depression 10/17/15     Suicidal thoughts 10/17/15     Homicidal thoughts 10/17/15     "I don't know what to do" 10/17/15                                    DISCHARGE CRITERIA:  Ability to meet basic life and health needs Improved stabilization in mood, thinking, and/or behavior Verbal commitment to aftercare and medication compliance  PRELIMINARY DISCHARGE PLAN: Attend aftercare/continuing care group  PATIENT/FAMIILY INVOLVEMENT: This treatment plan has been presented to and reviewed with the patient, Rito Peng, and/or family member, .  The patient and family have been given the opportunity to ask questions and make suggestions.  [place], [place] 10/17/2015, 11:50 PM

## 2015-10-18 ENCOUNTER — Encounter (HOSPITAL_COMMUNITY): Payer: Self-pay | Admitting: Psychiatry

## 2015-10-18 DIAGNOSIS — R45850 Homicidal ideations: Secondary | ICD-10-CM

## 2015-10-18 DIAGNOSIS — F1410 Cocaine abuse, uncomplicated: Secondary | ICD-10-CM

## 2015-10-18 DIAGNOSIS — F1010 Alcohol abuse, uncomplicated: Secondary | ICD-10-CM

## 2015-10-18 DIAGNOSIS — F332 Major depressive disorder, recurrent severe without psychotic features: Principal | ICD-10-CM

## 2015-10-18 DIAGNOSIS — R45851 Suicidal ideations: Secondary | ICD-10-CM

## 2015-10-18 MED ORDER — NICOTINE POLACRILEX 2 MG MT GUM
2.0000 mg | CHEWING_GUM | OROMUCOSAL | Status: DC | PRN
  Filled 2015-10-18: qty 1

## 2015-10-18 NOTE — Plan of Care (Signed)
Problem: Ineffective individual coping Goal: STG:Pt. will utilize relaxation techniques to reduce stress STG: Patient will utilize relaxation techniques to reduce stress levels  Outcome: Not Progressing Patient is not using effective coping skills to reduce his stress.  He is worried over expense of housing and continues to experience depression and anxiety.

## 2015-10-18 NOTE — Progress Notes (Signed)
Psychoeducational Group Note  Date:  10/18/2015 Time:  2300  Group Topic/Focus:  Wrap-Up Group:   The focus of this group is to help patients review their daily goal of treatment and discuss progress on daily workbooks.  Participation Level: Did Not Attend  Participation Quality:  Not Applicable  Affect:  Not Applicable  Cognitive:  Not Applicable  Insight:  Not Applicable  Engagement in Group: Not Applicable  Additional Comments:  The patient was encouraged to attend group but rolled over in bed and returned to sleep.   Archie Balboa S 10/18/2015, 10:57 PM

## 2015-10-18 NOTE — Progress Notes (Signed)
Recreation Therapy Notes  Date: 02.03.2017 Time: 9:30am Location: [address] Hall Group Room   Group Topic: Stress Management  Goal Area(s) Addresses:  Patient will actively participate in stress management techniques presented during session.   Behavioral Response: Did not attend.   Laureen Ochs Avion Patella, LRT/CTRS        Lane Hacker 10/18/2015 3:37 PM

## 2015-10-18 NOTE — BHH Group Notes (Signed)
[place] LCSW Group Therapy  10/18/2015 3:33 PM  Type of Therapy:  Group Therapy  Participation Level:  Active  Participation Quality:  Attentive  Affect:  Appropriate  Cognitive:  Oriented  Insight:  Improving  Engagement in Therapy:  Improving  Modes of Intervention:  Confrontation, Discussion, Education, Exploration, Problem-solving, Rapport Building, Socialization and Support  Summary of Progress/Problems: Feelings around Relapse. Group members discussed the meaning of relapse and shared personal stories of relapse, how it affected them and others, and how they perceived themselves during this time. Group members were encouraged to identify triggers, warning signs and coping skills used when facing the possibility of relapse. Social supports were discussed and explored in detail. Post Acute Withdrawal Syndrome (handout provided) was introduced and examined. Pt's were encouraged to ask questions, talk about key points associated with PAWS, and process this information in terms of relapse prevention. Richard [name] was attentive and engaged during todays' processing group. He shared his latest relapse stating, "I just left the hospital and found out my friend was murdered. I couldn't handle it because I knew who did it. I relapsed and came back here." Jayda stated that he is aware that he needs long term treatment to help him "gain back my sobriety and get away from the negativity around here." Jacobo continues to show progress in the group setting with improving insight."   Smart, Dash Cardarelli LCSW 10/18/2015, 3:33 PM

## 2015-10-18 NOTE — Progress Notes (Signed)
D: Patient continues to experience depressive symptoms.  He is homicidal toward someone who "hurt his friend."  He also reports passive SI.  He contracts for safety on the unit.  Patient was asked to fill out self inventory which he has not complied with.  He has not attended any groups today.  Patient is isolative to room. A: Continue to monitor medication management and MD orders.  Safety checks completed every 15 minutes per protocol.  Offer support and encouragement as needed. R: Patient has minimal interaction with staff and peers.

## 2015-10-18 NOTE — BHH Counselor (Signed)
Adult Comprehensive Assessment  Patient ID: Richard [name], male DOB: 11-Apr-1966, 50 y.o. MRN: [id number]  Information Source: Information source: Patient  Current Stressors:  Educational / Learning stressors: None reported Employment / Job issues: Pt currently on limited disability income Family Relationships: Supportive family, however Pt reports that he does not reach out to them often Museum/gallery curator / Lack of resources (include bankruptcy): Limited income- receives 400 in disabilty each month Housing / Lack of housing: Homeless currently. Has been living in a Publishing rights manager shop Physical health (include injuries & life threatening diseases): Hx of colon cancer- has colostomy bag Social relationships: Limited social support Substance abuse: Daily cocaine and marijuana use; uses ETOH when -relapsed immediately upon discharge from [facility] 2 days ago.  Bereavement / Loss: None reported  Living/Environment/Situation:  Living Arrangements: Other (Comment) (has been living in a Publishing rights manager shop) Living conditions (as described by patient or guardian): uncomfortable How long has patient lived in current situation?: Unknown What is atmosphere in current home: Chaotic  Family History:  Marital status: Single Does patient have children?: Yes How many children?: 2 How is patient's relationship with their children?: good relationship with children; oldest son lives in [place] and is significant support. Son brought him to hospital due to pt's relapse and statements of passive SI/HI.   Childhood History:  By whom was/is the patient raised?: Both parents Description of patient's relationship with caregiver when they were a child: decent relationship Patient's description of current relationship with people who raised him/her: mother is still living and is supportive How were you disciplined when you got in trouble as a child/adolescent?: unknown Does patient have siblings?: Yes Number  of Siblings: 4 Description of patient's current relationship with siblings: good relationship and they are supportive of patient Did patient suffer any verbal/emotional/physical/sexual abuse as a child?: No Did patient suffer from severe childhood neglect?: No Has patient ever been sexually abused/assaulted/raped as an adolescent or adult?: No Was the patient ever a victim of a crime or a disaster?: No Witnessed domestic violence?: No Has patient been effected by domestic violence as an adult?: No  Education:  Highest grade of school patient has completed: Some college Currently a Ship broker?: No Learning disability?: No  Employment/Work Situation:  Employment situation: On disability Why is patient on disability: medical issues How long has patient been on disability: one year Patient's job has been impacted by current illness: No What is the longest time patient has a held a job?: Unknown Where was the patient employed at that time?: Unknown Has patient ever been in the [employer]?: No Has patient ever served in combat?: No Did You Receive Any Psychiatric Treatment/Services While in Passenger transport manager?: No Are There Guns or Other Weapons in [place]?: No  Financial Resources:  Museum/gallery curator resources: [employer], No income, Medicaid Does patient have a Programmer, applications or guardian?: No  Alcohol/Substance Abuse:  What has been your use of drugs/alcohol within the last 12 months?: Relapsed within 4 hours of discharge from [facility] 2 days ago on ETOH, cocaine, and THC. (Unspecified amounts).  If attempted suicide, did drugs/alcohol play a role in this?: No Alcohol/Substance Abuse Treatment Hx: Past detox, Past Tx, Inpatient, Past Tx, Outpatient Has alcohol/substance abuse ever caused legal problems?: Yes  Social Support System:  Patient's Community Support System: Fair Describe Community Support System: family is supportive, however he does not want to burden them Type of  faith/religion: christian How does patient's faith help to cope with current illness?: Pt did not state  Leisure/Recreation:  Leisure and Hobbies: Unknown  Strengths/Needs:  What things does the patient do well?: working on cars In what areas does patient struggle / problems for patient: managing social issues  Discharge Plan:  Does patient have access to transportation?: No Plan for no access to transportation at discharge: public transit Will patient be returning to same living situation after discharge?: Possibly-pt currently homeless and has been staying with friends.  Plan for living situation after discharge: Pt given housing resources.  Currently receiving community mental health services: Yes-pt referred to [facility] a few days ago during last admission.  If no, would patient like referral for services when discharged?: Yes -possibly [facility]    Summary/Recommendations:   Summary and Recommendations (to be completed by the evaluator): Patient is 49 year old male living with a diagnosis of Major Depressive Disorder, Recurrent, severe, Alcohol use disorder/moderate, Cocaine use Disorder/moderate, and Marijuana use Disorder/moderate. He presents to the hospital one day after being discharged due to increased depression, throughts of suicidal ideation/homicidal ideation with no plan, and due to relapse on alcohol, cocaine, and marijuana. Patient reports that primary trigger is finding out that his friend was murdered. Recommendations for patient include: crisis stabilization, therapeutic milieu, encourage group attendance and participation, medication management for mood stabilization, and development of comprehensive mental wellness/sobriety plan.   Smart, Shenise Wolgamott LCSW  10/18/2015 1:13 PM

## 2015-10-18 NOTE — BHH Suicide Risk Assessment (Signed)
[place] INPATIENT:  Family/Significant Other Suicide Prevention Education  Suicide Prevention Education:  Patient Refusal for Family/Significant Other Suicide Prevention Education: The patient Richard [name] has refused to provide written consent for family/significant other to be provided Family/Significant Other Suicide Prevention Education during admission and/or prior to discharge.  Physician notified.  SPE completed with pt, as pt refused to consent to family contact. SPI pamphlet provided to pt and pt was encouraged to share information with support network, ask questions, and talk about any concerns relating to SPE. Pt denies access to guns/firearms and verbalized understanding of information provided. Mobile Crisis information also provided to pt.   Smart, Richard Hancock LCSW 10/18/2015, 3:55 PM

## 2015-10-18 NOTE — BHH Group Notes (Signed)
[facility] LCSW Aftercare Discharge Planning Group Note   10/18/2015 9:35 AM  Participation Quality:  Invited. DID NOT ATTEND. Chose to remain in bed.   Smart, Gisselle Galvis LCSW

## 2015-10-18 NOTE — H&P (Addendum)
Psychiatric Admission Assessment Adult  Patient Identification: Richard [name] MRN:  [id number] Date of Evaluation:  10/18/2015 Chief Complaint:  MDD Principal Diagnosis: MDD (major depressive disorder), recurrent episode, severe ([place]) Diagnosis:   Patient Active Problem List   Diagnosis Date Noted  . MDD (major depressive disorder), recurrent episode, severe ([place]) [F33.2] 10/17/2015  . Cocaine abuse [F14.10] 10/17/2015  . Alcohol abuse [F10.10] 10/17/2015  . Severe single current episode of major depressive disorder, with psychotic features ([place]) [F32.3]   . Substance induced mood disorder ([place]) [F19.94] 10/08/2015  . 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 Colon cancer s/p chemo + radiation and resection. Now with colostomy bag [C18.9] 04/10/2015  . SVT (supraventricular tachycardia) ([place]) [I47.1] 04/10/2015  . Tobacco abuse [Z72.0] 04/10/2015   History of Present Illness:: 50 Y/O male who was D/C from our unit February the first. He initiailly went to the ED for chest pain and later complained of  Depression, SI  and substance abuse. He was placed on Prozac and Abilify and D/C home stable. He states that shortly after he left he found out that one of his best friends was murdered by a mutual friend. States he could not take it. He was so "messed up" in his mind that he relapsed as he could not handled it, he wanted to kill the friend  and kill himself. He went to see his son at [facility] and his son took him back to [facility] ED  Richard [name] is an 50 y.o. male presenting as a walk-in to [facility] for worsening depression, along with SI/HI. His son reportedly brought him to the ED because he was worried about him. Pt was just discharged from [facility] yesterday but states that he learned of his best friend's murder just 4 hours after leaving [facility]. He states that he knows the man who murdered his friend because he was a mutual friend of the two. Pt reports feeling angry, confused, and betrayed. He has been having  thoughts of completing a murder-suicide, killing his friend's murderer and then taking his own life. However, he says he has no plan or intent to act on these thoughts. Pt says "It took a lot for me to come back here [to [place], but I'm scared of what I'll do". Pt goes on to say that he's been hurting so badly that he relapsed and used cocaine, marijuana, and alcohol since he was discharged yesterday. Pt reports a long hx of substance abuse and went to [facility] for treatment in 2016. Pt also c/o insomnia, hopelessness, fatigue, guilt, crying spells, increased anger, anhedonia, social isolation, and lack of motivation.   Associated Signs/Symptoms: Depression Symptoms:  Depressed mood lack of energy lack of motivation tired crying but also irritable angry (Hypo) Manic Symptoms:  Irritable Mood, Labiality of Mood, Anxiety Symptoms:  Excessive Worry, Psychotic Symptoms:  "noises" when he uses cocaine PTSD Symptoms: Negative Total Time spent with patient: 30 minutes  Past Psychiatric History:   Risk to Self: Suicidal Ideation: Yes-Currently Present Suicidal Intent: No Is patient at risk for suicide?: Yes Suicidal Plan?: Yes-Currently Present Specify Current Suicidal Plan: Murder-suicide (Kill friend's murderer and then self) Access to Means: Yes Specify Access to Suicidal Means: Access to firearms What has been your use of drugs/alcohol within the last 12 months?: Etoh, marijuana, cocaine use How many times?: 0 Other Self Harm Risks: SA Triggers for Past Attempts: None known Intentional Self Injurious Behavior: None Risk to Others: Homicidal Ideation: Yes-Currently Present Thoughts of Harm to  Others: Yes-Currently Present Comment - Thoughts of Harm to Others: Thoughts of harming best friend's murderer Current Homicidal Intent: No Current Homicidal Plan: Yes-Currently Present Describe Current Homicidal Plan: Murder-suicide (Kill best friend's murderer and then self) Access to Homicidal Means:  Yes Describe Access to Homicidal Means: Could gain access to firearms, if needed Identified Victim: Best friend's murderer History of harm to others?: No Assessment of Violence: None Noted Violent Behavior Description: Pt denies hx of violence but has been having violent thoughts towards person who murdered his best friend Does patient have access to weapons?: Yes (Comment) (No personal access but could gain access if he needed to) Criminal Charges Pending?: No Does patient have a court date: No Prior Inpatient Therapy: Prior Inpatient Therapy: Yes Prior Therapy Dates: 09/2015, 2016 Prior Therapy Facilty/Provider(s): [facility], [place] Reason for Treatment: SI, Depression, SA Prior Outpatient Therapy: Prior Outpatient Therapy: No Prior Therapy Dates: na Prior Therapy Facilty/Provider(s): na Reason for Treatment: na Does patient have an ACCT team?: No Does patient have Intensive In-House Services?  : No Does patient have [facility] services? : No Does patient have P4CC services?: No Referred to [facility] has not been able to go to an appointment as he was readmitted Alcohol Screening: 1. How often do you have a drink containing alcohol?: 4 or more times a week 2. How many drinks containing alcohol do you have on a typical day when you are drinking?: 3 or 4 3. How often do you have six or more drinks on one occasion?: Weekly Preliminary Score: 4 4. How often during the last year have you found that you were not able to stop drinking once you had started?: Never 5. How often during the last year have you failed to do what was normally expected from you becasue of drinking?: Never 6. How often during the last year have you needed a first drink in the morning to get yourself going after a heavy drinking session?: Never 7. How often during the last year have you had a feeling of guilt of remorse after drinking?: Never 8. How often during the last year have you been unable to remember what happened the  night before because you had been drinking?: Never 9. Have you or someone else been injured as a result of your drinking?: No 10. Has a relative or friend or a doctor or another health worker been concerned about your drinking or suggested you cut down?: No Alcohol Use Disorder Identification Test Final Score (AUDIT): 8 Brief Intervention: Patient declined brief intervention Substance Abuse History in the last 12 months:  Yes.   Consequences of Substance Abuse: Blackouts:   Previous Psychotropic Medications: Yes Prozac Abilify ( states they were helping while he was here) Psychological Evaluations: No  Past Medical History:  Past Medical History  Diagnosis Date  . Colon cancer ([place])   . Colon cancer ([place]) 2015  . Colostomy complication ([place]) 11-[birth year]    blood in colostomy bag    Past Surgical History  Procedure Laterality Date  . Partial colectomy  06/2014    done at [facility]   Family History:  Family History  Problem Relation Age of Onset  . Coronary artery disease Cousin 21   Family Psychiatric  History: cousin committed suicide, has 2 sisters with substance abuse Tobacco Screening: _0 ([phone])::1)@ Social History:  History  Alcohol Use  . 0.6 oz/week  . 1 Cans of beer per week    Comment: drinks 2 times a week     History  Drug Use  . Yes  . Special: Marijuana, Cocaine    Comment: Lst used 3 days ago   Single on disability lives by himself has 2 sons. Past history of colon cancer with colon ostomy. Has not been able to work Additional Social History: Marital status: Single    Pain Medications: See PTA med list Prescriptions: See PTA med list Over the Counter: See PTA med list History of alcohol / drug use?: Yes Longest period of sobriety (when/how long): Was sober for 1 week after discharge from [facility] on 10/08/15 Negative Consequences of Use: Financial, Personal relationships, Work / Youth worker, Scientist, research (physical sciences) Withdrawal Symptoms:  (Denies) Name of Substance 1: Alcohol 1  - Age of First Use: Adolescent 1 - Amount (size/oz): 6-12 cans of beer 1 - Frequency: Daily when available 1 - Duration: Ongoing for years 1 - Last Use / Amount: 10/17/15, several beers Name of Substance 2: Cocaine 2 - Age of First Use: 22 2 - Amount (size/oz): Varies 2 - Frequency: Daily when available 2 - Duration: Ongoing for years 2 - Last Use / Amount: 10/17/15, $20 worth Name of Substance 3: Marijuana 3 - Age of First Use: Adolescent 3 - Amount (size/oz): varies 3 - Frequency: Daily when available 3 - Duration: Ongoing for years 3 - Last Use / Amount: 10/17/15, amount unknown              Allergies:   Allergies  Allergen Reactions  . Morphine And Related Itching   Lab Results: No results found for this or any previous visit (from the past 48 hour(s)).  Metabolic Disorder Labs:  No results found for: HGBA1C, MPG No results found for: PROLACTIN No results found for: CHOL, TRIG, HDL, CHOLHDL, VLDL, LDLCALC  Current Medications: Current Facility-Administered Medications  Medication Dose Route Frequency Provider Last Rate Last Dose  . acetaminophen (TYLENOL) tablet 650 mg  650 mg Oral Q6H PRN Harriet Butte, NP      . alum & mag hydroxide-simeth (MAALOX/MYLANTA) 200-200-20 MG/5ML suspension 30 mL  30 mL Oral Q4H PRN Harriet Butte, NP      . ARIPiprazole (ABILIFY) tablet 5 mg  5 mg Oral Daily Harriet Butte, NP   5 mg at 10/18/15 0817  . FLUoxetine (PROZAC) capsule 20 mg  20 mg Oral Daily Harriet Butte, NP   20 mg at 10/18/15 4580  . hydrOXYzine (ATARAX/VISTARIL) tablet 25 mg  25 mg Oral Q6H PRN Harriet Butte, NP      . magnesium hydroxide (MILK OF MAGNESIA) suspension 30 mL  30 mL Oral Daily PRN Harriet Butte, NP      . nicotine polacrilex (NICORETTE) gum 2 mg  2 mg Oral PRN Nicholaus Bloom, MD      . traZODone (DESYREL) tablet 100 mg  100 mg Oral QHS Harriet Butte, NP   100 mg at 10/17/15 2345   PTA Medications: Prescriptions prior to admission   Medication Sig Dispense Refill Last Dose  . ARIPiprazole (ABILIFY) 5 MG tablet Take 1 tablet (5 mg total) by mouth daily. For mood control 30 tablet 0   . FLUoxetine (PROZAC) 20 MG capsule Take 1 capsule (20 mg total) by mouth daily. For depression 30 capsule 0   . hydrOXYzine (ATARAX/VISTARIL) 25 MG tablet Take 1 tablet (25 mg total) by mouth every 6 (six) hours as needed for anxiety. 60 tablet 0   . nicotine (NICODERM CQ - DOSED IN MG/24 HOURS) 21 mg/24hr patch Place 1 patch (  21 mg total) onto the skin daily. For smoking cessation 28 patch 0   . traZODone (DESYREL) 100 MG tablet Take 1 tablet (100 mg total) by mouth at bedtime. For sleep 30 tablet 0     Musculoskeletal: Strength & Muscle Tone: within normal limits Gait & Station: normal Patient leans: normal  Psychiatric Specialty Exam: Physical Exam  ROS  Blood pressure 104/73, pulse 91, temperature 98.2 F (36.8 C), temperature source Oral, resp. rate 12, height 5' 7.25" (1.708 m), weight 107.502 kg (237 lb).Body mass index is 36.85 kg/(m^2).  General Appearance: Disheveled  Eye Contact::  Minimal  Speech:  Clear and Coherent and Slow  Volume:  Decreased  Mood:  Anxious, Depressed and Dysphoric  Affect:  Depressed and anxious worried  Thought Process:  Coherent and Goal Directed  Orientation:  Full (Time, Place, and Person)  Thought Content:  symptoms events worries concerns  Suicidal Thoughts:  Yes with no intent or plan and can contract for safety  Homicidal Thoughts:  Yes no specific plan or intent  Memory:  Immediate;   Fair Recent;   Fair Remote;   Fair  Judgement:  Fair  Insight:  Present and Shallow  Psychomotor Activity:  Decrease  Concentration:  Fair  Recall:  [employer] of Knowledge:Fair  Language: Fair  Akathisia:  No  Handed:  Right  AIMS (if indicated):     Assets:  Desire for Improvement Social Support  ADL's:  Intact  Cognition: WNL  Sleep:  Number of Hours: 5.75     Treatment Plan  Summary: Daily contact with patient to assess and evaluate symptoms and progress in treatment and Medication management Supportive approach/coping skills Alcohol cocaine abuse-dependence; monitor for S/S of withdrawal needing detox Work a relapse prevention plan Depression; continue the Prozac 20 mg with Abilify augmentation Perceptual disturbances; pursue the Abilify further Work with CBT/minfulness Help process the death of his friend (grief loss) Improve his rational thinking in terms of considering the consequences if he was to kill the guy who killed his friend and then kill himself as he was thinking before he came here Observation Level/Precautions:  15 minute checks  Laboratory:  As per the ED  Psychotherapy:  Individual/group  Medications:  Resume the Prozac and Abilify, reassess for detox needs  Consultations:    Discharge Concerns:  Need for a residential treatment program  Estimated LOS: 3-5 days  Other:     I certify that inpatient services furnished can reasonably be expected to improve the patient's condition.    Nicholaus Bloom, MD 2/3/201711:46 AM

## 2015-10-18 NOTE — BHH Suicide Risk Assessment (Signed)
[facility] Admission Suicide Risk Assessment   Nursing information obtained from:    Demographic factors:    Current Mental Status:    Loss Factors:    Historical Factors:    Risk Reduction Factors:     Total Time spent with patient: 30 minutes Principal Problem: MDD (major depressive disorder), recurrent episode, severe ([place]) Diagnosis:   Patient Active Problem List   Diagnosis Date Noted  . MDD (major depressive disorder), recurrent episode, severe ([place]) [F33.2] 10/17/2015  . Cocaine abuse [F14.10] 10/17/2015  . Alcohol abuse [F10.10] 10/17/2015  . Severe single current episode of major depressive disorder, with psychotic features ([place]) [F32.3]   . Substance induced mood disorder ([place]) [F19.94] 10/08/2015  . Colostomy complication ([place]) [id number]   . Colon cancer s/p chemo + radiation and resection. Now with colostomy bag [C18.9] 04/10/2015  . SVT (supraventricular tachycardia) ([place]) [I47.1] 04/10/2015  . Tobacco abuse [Z72.0] 04/10/2015   Subjective Data: see admission H and P  Continued Clinical Symptoms:  Alcohol Use Disorder Identification Test Final Score (AUDIT): 8 The "Alcohol Use Disorders Identification Test", Guidelines for Use in Primary Care, Second Edition.  World Pharmacologist [facility]). Score between 0-7:  no or low risk or alcohol related problems. Score between 8-15:  moderate risk of alcohol related problems. Score between 16-19:  high risk of alcohol related problems. Score 20 or above:  warrants further diagnostic evaluation for alcohol dependence and treatment.   CLINICAL FACTORS:   Depression:   Comorbid alcohol abuse/dependence Alcohol/Substance Abuse/Dependencies   Psychiatric Specialty Exam: Review of Systems  Constitutional: Negative.   Eyes: Negative.   Respiratory: Negative.   Cardiovascular: Negative.   Gastrointestinal: Negative.   Genitourinary: Negative.   Musculoskeletal: Negative.   Skin: Negative.   Neurological: Positive for dizziness  and headaches.  Endo/Heme/Allergies: Negative.   Psychiatric/Behavioral: Positive for depression, suicidal ideas and substance abuse. The patient is nervous/anxious and has insomnia.     Blood pressure 104/73, pulse 91, temperature 98.2 F (36.8 C), temperature source Oral, resp. rate 12, height 5' 7.25" (1.708 m), weight 107.502 kg (237 lb).Body mass index is 36.85 kg/(m^2).  COGNITIVE FEATURES THAT CONTRIBUTE TO RISK:  Closed-mindedness, Polarized thinking and Thought constriction (tunnel vision)    SUICIDE RISK:   Moderate:  Frequent suicidal ideation with limited intensity, and duration, some specificity in terms of plans, no associated intent, good self-control, limited dysphoria/symptomatology, some risk factors present, and identifiable protective factors, including available and accessible social support.  PLAN OF CARE: see admission H and P  I certify that inpatient services furnished can reasonably be expected to improve the patient's condition.   Nicholaus Bloom, MD 10/18/2015, 12:46 PM

## 2015-10-18 NOTE — Tx Team (Signed)
Interdisciplinary Treatment Plan Update (Adult)  Date:  10/18/2015  Time Reviewed:  8:33 AM   Progress in Treatment: Attending groups: No. New to unit.  Participating in groups:  No. Taking medication as prescribed:  Yes. Tolerating medication:  Yes. Family/Significant othe contact made:   Patient understands diagnosis:  Yes. and As evidenced by:  seeking treatment for SI, depression, relapse on cocaine, marijuana, and alcohol upon d/c from [facility] Discussing patient identified problems/goals with staff:  Yes. Medical problems stabilized or resolved:  Yes. Denies suicidal/homicidal ideation: No.Passive Si/no plan.  Issues/concerns per patient self-inventory:  Other:  Discharge Plan or Barriers:  Pt had been referred to [facility] during last admission. CSW assessing. Pt reports that he is homeless and stays with friends.   Reason for Continuation of Hospitalization: Depression Medication stabilization Suicidal ideation Withdrawal symptoms  Comments:  Richard [name] is an 50 y.o. male presenting as a walk-in to [facility] for worsening depression, along with SI/HI. His son reportedly brought him to the ED because he was worried about him. Pt was just discharged from [facility] yesterday but states that he learned of his best friend's murder just 4 hours after leaving [facility]. He states that he knows the man who murdered his friend because he was a mutual friend of the two. Pt reports feeling angry, confused, and betrayed. He has been having thoughts of completing a murder-suicide, killing his friend's murderer and then taking his own life. However, he says he has no plan or intent to act on these thoughts. Pt says "It took a lot for me to come back here [to [place], but I'm scared of what I'll do". Pt goes on to say that he's been hurting so badly that he relapsed and used cocaine, marijuana, and alcohol since he was discharged yesterday. Pt reports a long hx of substance abuse and went to [facility] for treatment in  2016. Pt also c/o insomnia, hopelessness, fatigue, guilt, crying spells, increased anger, anhedonia, social isolation, and lack of motivation. His only hx of psychiatric hospitalization is his admission to [facility] last month.He is now homeless and stays with friends when possible. He reports financial stressors, housing problems, and family stressors. He has never seen a psychiatrist or counselor for treatment. He adds that he never followed-up with mental health tx and never filled the meds prescribed by [facility] on his last admission because he had no time or funds to do so (he was just d/c on 10/08/15); therefore, he has not been taking any psychiatric medication since his d/c. He denies having any social supports and denies any hx of physical, emotional, or sexual abuse. He denies A/VH and psychotic sx Diagnosis:  296.33 Major depressive disorder, Recurrent, Severe 304.20 Cocaine use disorder, Moderate to Severe 303.90 Alcohol use disorder, Moderate 304.30 Cannabis use disorder, Moderate  Estimated length of stay:  3-5 days   New goal(s): to develop effective aftercare plan.   Additional Comments:  Patient and CSW reviewed pt's identified goals and treatment plan. Patient verbalized understanding and agreed to treatment plan. CSW reviewed [facility] "Discharge Process and Patient Involvement" Form. Pt verbalized understanding of information provided and signed form.    Review of initial/current patient goals per problem list:  1. Goal(s): Patient will participate in aftercare plan  Met: No.   Target date: at discharge  As evidenced by: Patient will participate within aftercare plan AEB aftercare provider and housing plan at discharge being identified.  2/3: CSW assessing for appropriate referrals.   2.  Goal (s): Patient will exhibit decreased depressive symptoms and suicidal ideations.  Met: No.    Target date: at discharge  As evidenced by: Patient will utilize self rating of  depression at 3 or below and demonstrate decreased signs of depression or be deemed stable for discharge by MD.  2/3: Pt rates depression as high and endorses passive SI. No HI/AVH.   3. Goal(s): Patient will demonstrate decreased signs of withdrawal due to substance abuse  Met:Yes.   Target date:at discharge   As evidenced by: Patient will produce a CIWA/COWS score of 0, have stable vitals signs, and no symptoms of withdrawal.  2/3: Pt reports no signs of withdrawal with CIWA/COWS of 0 and stable vitals.   Attendees: Patient:   10/18/2015 8:33 AM   Family:   10/18/2015 8:33 AM   Physician:  Dr. Carlton Adam, MD 10/18/2015 8:33 AM   Nursing:   Colin Mulders RN 10/18/2015 8:33 AM   Clinical Social Worker: Maxie Better, LCSW 10/18/2015 8:33 AM   Clinical Social Worker: Erasmo Downer Drinkard LCSWA; Peri Maris LCSWA 10/18/2015 8:33 AM   Other:  Gerline Legacy Nurse Case Manager 10/18/2015 8:33 AM   Other:   10/18/2015 8:33 AM   Other:   10/18/2015 8:33 AM   Other:  10/18/2015 8:33 AM   Other:  10/18/2015 8:33 AM   Other:  10/18/2015 8:33 AM    10/18/2015 8:33 AM    10/18/2015 8:33 AM    10/18/2015 8:33 AM    10/18/2015 8:33 AM    Scribe for Treatment Team:   Maxie Better, LCSW 10/18/2015 8:33 AM

## 2015-10-19 NOTE — Progress Notes (Signed)
Patient did not attend the evening speaker [place] meeting. Pt was notified that group was beginning and returned to his room.

## 2015-10-19 NOTE — Progress Notes (Signed)
D. Pt has been in room and in bed for much of the evening, minimal interaction or participation. Pt does endorse on-going depression but able to contract for safety while in the hospital. Pt spoke briefly about how he was feeling tired this evening and wanted to get some rest, pt did not verbalize any complaints of pain and also chose not to take bedtime sleep medication. A. Support provided R. Safety maintained

## 2015-10-19 NOTE — Plan of Care (Signed)
Problem: Alteration in mood Goal: LTG-Patient reports reduction in suicidal thoughts (Patient reports reduction in suicidal thoughts and is able to verbalize a safety plan for whenever patient is feeling suicidal)  Outcome: Not Progressing Patient continues to report passive suicidal ideation.  He does contract for safety on the unit.

## 2015-10-19 NOTE — Progress Notes (Signed)
D: Patient observed in day room playing cards with his peers.  This is an improvement from yesterday, as he isolated to room majority of day.  He is compliant with his medications.  He denied SI when asked, however, did list on his self inventory that he remains with suicidal thoughts.  He also continues to have homicidal thoughts about "someone that is outside of here."  Patient's best friend was killed recently and he continues to have thoughts of hurting the person that was responsible. He rates his depression, hopelessness and anxiety as a 9.  He denies AVH.  Patient's goal today is to work on his "depression and anger."  Patient has a colostomy from previous colon cancer and has supplies in the medication room. Encourage patient to attend groups today. A: Continue to monitor medication management and MD orders.  Safety checks completed every 15 minutes per protocol.  Offer support and encouragement as needed. R: Patient is receptive to staff; his behavior is appropriate.

## 2015-10-19 NOTE — BHH Group Notes (Signed)
[place] Group Notes:  (Nursing/MHT/Case Management/Adjunct)  Date:  10/19/2015  Time: 0845  Type of Therapy:  Psychoeducational Skills  Participation Level:  Minimal  Participation Quality:  Resistant  Affect:  Anxious  Cognitive:  Alert  Insight:  Lacking  Engagement in Group:  Resistant  Modes of Intervention:  Support  Summary of Progress/Problems:   Richard [name] 10/19/2015, 8:58 AM

## 2015-10-19 NOTE — [facility] Group Notes (Signed)
[place] Group Notes:  (Clinical Social Work)   07/13/2015     10:00-11:00AM  Summary of Progress/Problems:   In today's process group a Chain Analysis exercise on 2 patients who volunteered was done.  We processed what happened in the most recent relapse, discussed possible points for intervention and made plans for how to produce a different outcome in the future.   Motivational Interviewing and the whiteboard were utilized for the exercises.  The patient expressed little in group, appeared to be very drowsy and unengaged.  He said he does not know his discharge plan yet.  He left the room for awhile, but came back, still did not interact.  Type of Therapy:  Group Therapy - Process   Participation Level:  Active  Participation Quality:  Drowsy  Affect:  Flat  Cognitive:  Oriented  Insight:  Limited  Engagement in Therapy:  Limited  Modes of Intervention:  Education, Motivational Interviewing  Selmer Dominion, LCSW 10/19/2015, 12:34 PM
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
## 2015-10-20 DIAGNOSIS — F332 Major depressive disorder, recurrent severe without psychotic features: Secondary | ICD-10-CM | POA: Insufficient documentation

## 2015-10-20 NOTE — Progress Notes (Signed)
D.  Pt pleasant on approach, denies complaints at this time.  Hopeful for a long term treatment placement upon discharge.  Denies SI/HI/halluciantions at this time.  Minimal interaction on unit this weekend.  A.  Support and encouragement offered  R.  Pt remains safe on the unit, will continue to monitor.

## 2015-10-20 NOTE — Progress Notes (Signed)
Patient did not attend the evening speaker [place] meeting.  Pt watched the Superbowl during group time.

## 2015-10-20 NOTE — [facility] Group Notes (Signed)
[facility] Group Notes:  Healthy Coping skills  Date:  10/20/2015  Time:  1300  Type of Therapy:  Nurse Education  Participation Level:  Active  Participation Quality:  Attentive  Affect:  Appropriate  Cognitive:  Appropriate  Insight:  Appropriate  Engagement in Group:  Engaged  Modes of Intervention:  Discussion  Summary of Progress/Problems:Pt stated he has many support people in his life. He stated his family is very supportive. 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 He would like to do things that make him  Feel happy. Pt also stated his son is on a basketball scholarship and remains on the dean's list. The pt. Pays for whatever his son needs. He stated,"I did everything I could to keep my kids off the streets and I will do whatever I need to do to make sure they lead good lives."  Marcello Moores [facility] 10/20/2015, 2:00 PM

## 2015-10-20 NOTE — [facility] Group Notes (Signed)
[place] Group Notes:  (Clinical Social Work)  10/20/2015  10:00-11:00AM  Summary of Progress/Problems:   The main focus of today's process group was to   1)  Talk about 4 definitions of support  2)  Discuss how sometimes the support we want from someone is not possible, but that does not mean those people do not support [place] in another way  3)  Define health supports versus unhealthy supports  4)  Discuss the importance of adding healthy supports and deciding how to deal with unhealthy supports  The patient expressed the belief that a homeless individual obviously needs a home, so people who talk to him/her with encouraging words only and not assistance with the actual need for housing is only "self-gratitude."  He said it is more hurtful to the person to get their hopes up and then be let down again.  He started to get somewhat irritable that CSWs in this hospital are not more helpful in seeking housing, and was able to hear this CSW's explanation, using the definitions of support that had been provided.  Type of Therapy:  Process Group with Motivational Interviewing  Participation Level:  Active  Participation Quality:  Attentive and Sharing  Affect:  Defensive and Flat  Cognitive:  Appropriate  Insight:  Developing/Improving  Engagement in Therapy:  Engaged  Modes of Intervention:   Education, Support and Processing, Activity  Selmer Dominion, LCSW 10/20/2015

## 2015-10-20 NOTE — Progress Notes (Addendum)
Pt is pleasant and cooperative. He does have a slight body odor but stated he is fine with colostomy supplies and does not need any assistance with changing his bags. Pt denies SI or HI and contracts for safety. Pt rates his depression a 9/10 and his anxiety a 9/10. Pt has not developed a discharge plan yet. At this time he does not have any withdrawal symptom.s Will continue to monitor closely. Pts goal today is to work on depression and anger. 7pm -Report to Richard [name].
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
## 2015-10-21 NOTE — Progress Notes (Signed)
Patient ID: Richard [name], male   DOB: [birth year], 50 y.o.   MRN: [id number]  DAR: Pt. Denies SI/HI and A/V Hallucinations. He reports sleep is fair, appetite is fair, energy level is low, and concentration is poor. He rates depression 8/10, hopelessness 8/10, and anxiety 8/10. Patient does not report any pain or discomfort at this time. Support and encouragement provided to the patient. Scheduled medications administered to patient per physician's orders. Patient is receptive and cooperative. He is seen in the milieu at times but forwards little when speaking with Probation officer. Patient changed colostomy bag today. Q15 minute checks are maintained for safety.

## 2015-10-21 NOTE — Plan of Care (Signed)
Problem: Alteration in mood Goal: LTG-Pt's behavior demonstrates decreased signs of depression (Patient's behavior demonstrates decreased signs of depression to the point the patient is safe to return home and continue treatment in an outpatient setting)  Outcome: Progressing Although Pt still expresses appropriate need for help, he did come out of his room and watch Super Bowl with peers tonight.  Appropriate interaction observed.

## 2015-10-21 NOTE — B[facility] Group Notes (Signed)
[facility] LCSW Aftercare Discharge Planning Group Note   10/21/2015 11:07 AM  Participation Quality:  Invited. DID NOT ATTEND> pt chose to remain in bed.   Smart, Daviana Haymaker LCSW

## 2015-10-21 NOTE — BHH Group Notes (Signed)
Adult Psychoeducational Group Note  Date:  10/21/2015 Time:  9:40 PM  Group Topic/Focus:  AA Meeting  Participation Level:  Minimal  Participation Quality:  Attentive  Affect:  Appropriate  Cognitive:  Appropriate  Insight: Limited  Engagement in Group:  Limited  Modes of Intervention:  Discussion and Education  Additional Comments:  Pt attended [place] meeting.  Victorino Sparrow A 10/21/2015, 9:40 PM

## 2015-10-21 NOTE — Progress Notes (Signed)
[facility] MD Progress Note  10/21/2015 8:10 PM Richard [name]  MRN:  [id number] Subjective:  States that he would like to pursue residential treatment as is worried about relapsing and acting out on the thoughts he was having. Explained the the guy who was killed in one of 4 guys including himself that grew up together were involved with drugs together. He still does not know what happened that his one friend killed the other. States that the police would probably not touch the case. He has not been able to communicate with the other guys and does not know what are they thinking what are they doing. States still very upset. Was able to date his depression getting real bad after the colon cancer the colostomy. Still affected by it.  Principal Problem: MDD (major depressive disorder), recurrent episode, severe ([place]) Diagnosis:   Patient Active Problem List   Diagnosis Date Noted  . Severe episode of recurrent major depressive disorder, without psychotic features ([place]) [F33.2]   . MDD (major depressive disorder), recurrent episode, severe ([place]) [F33.2] 10/17/2015  . Cocaine abuse [F14.10] 10/17/2015  . Alcohol abuse [F10.10] 10/17/2015  . Severe single current episode of major depressive disorder, with psychotic features ([place]) [F32.3]   . Substance induced mood disorder ([place]) [F19.94] 10/08/2015  . 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 Colon cancer s/p chemo + radiation and resection. Now with colostomy bag [C18.9] 04/10/2015  . SVT (supraventricular tachycardia) ([place]) [I47.1] 04/10/2015  . Tobacco abuse [Z72.0] 04/10/2015   Total Time spent with patient: 20 minutes  Past Psychiatric History: see admission H and P  Past Medical History:  Past Medical History  Diagnosis Date  . Colon cancer ([place])   . Colon cancer ([place]) 2015  . Colostomy complication ([place]) 19-41-74    blood in colostomy bag    Past Surgical History  Procedure Laterality Date  . Partial colectomy  06/2014    done at [facility]    Family History:  Family History  Problem Relation Age of Onset  . Coronary artery disease Cousin 57   Family Psychiatric  History: see admission H and P Social History:  History  Alcohol Use  . 0.6 oz/week  . 1 Cans of beer per week    Comment: drinks 2 times a week     History  Drug Use  . Yes  . Special: Marijuana, Cocaine    Comment: Lst used 3 days ago    Social History   Social History  . Marital Status: Single    Spouse Name: N/A  . Number of Children: N/A  . Years of Education: N/A   Social History Main Topics  . Smoking status: Current Every Day Smoker -- 0.50 packs/day    Types: Cigarettes  . Smokeless tobacco: Never Used  . Alcohol Use: 0.6 oz/week    1 Cans of beer per week     Comment: drinks 2 times a week  . Drug Use: Yes    Special: Marijuana, Cocaine     Comment: Lst used 3 days ago  . Sexual Activity: No   Other Topics Concern  . None   Social History Narrative   Additional Social History:    Pain Medications: See PTA med list Prescriptions: See PTA med list Over the Counter: See PTA med list History of alcohol / drug use?: Yes Longest period of sobriety (when/how long): Was sober for 1 week after discharge from [facility] on 10/08/15 Negative Consequences of Use: Financial, Personal relationships, Work /  School, Legal Withdrawal Symptoms:  (Denies) Name of Substance 1: Alcohol 1 - Age of First Use: Adolescent 1 - Amount (size/oz): 6-12 cans of beer 1 - Frequency: Daily when available 1 - Duration: Ongoing for years 1 - Last Use / Amount: 10/17/15, several beers Name of Substance 2: Cocaine 2 - Age of First Use: 22 2 - Amount (size/oz): Varies 2 - Frequency: Daily when available 2 - Duration: Ongoing for years 2 - Last Use / Amount: 10/17/15, $20 worth Name of Substance 3: Marijuana 3 - Age of First Use: Adolescent 3 - Amount (size/oz): varies 3 - Frequency: Daily when available 3 - Duration: Ongoing for years 3 - Last Use / Amount:  10/17/15, amount unknown              Sleep: Fair  Appetite:  Fair  Current Medications: Current Facility-Administered Medications  Medication Dose Route Frequency Provider Last Rate Last Dose  . acetaminophen (TYLENOL) tablet 650 mg  650 mg Oral Q6H PRN Harriet Butte, NP      . alum & mag hydroxide-simeth (MAALOX/MYLANTA) 200-200-20 MG/5ML suspension 30 mL  30 mL Oral Q4H PRN Harriet Butte, NP   30 mL at 10/20/15 2311  . ARIPiprazole (ABILIFY) tablet 5 mg  5 mg Oral Daily Harriet Butte, NP   5 mg at 10/21/15 0858  . FLUoxetine (PROZAC) capsule 20 mg  20 mg Oral Daily Harriet Butte, NP   20 mg at 10/21/15 0858  . hydrOXYzine (ATARAX/VISTARIL) tablet 25 mg  25 mg Oral Q6H PRN Harriet Butte, NP      . magnesium hydroxide (MILK OF MAGNESIA) suspension 30 mL  30 mL Oral Daily PRN Harriet Butte, NP      . nicotine polacrilex (NICORETTE) gum 2 mg  2 mg Oral PRN Nicholaus Bloom, MD      . traZODone (DESYREL) tablet 100 mg  100 mg Oral QHS Harriet Butte, NP   100 mg at 10/20/15 2243    Lab Results: No results found for this or any previous visit (from the past 48 hour(s)).  Physical Findings: AIMS: Facial and Oral Movements Muscles of Facial Expression: None, normal Lips and Perioral Area: None, normal Jaw: None, normal Tongue: None, normal,Extremity Movements Upper (arms, wrists, hands, fingers): None, normal Lower (legs, knees, ankles, toes): None, normal, Trunk Movements Neck, shoulders, hips: None, normal, Overall Severity Severity of abnormal movements (highest score from questions above): None, normal Incapacitation due to abnormal movements: None, normal Patient's awareness of abnormal movements (rate only patient's report): No Awareness, Dental Status Current problems with teeth and/or dentures?: No Does patient usually wear dentures?: No  CIWA:    COWS:     Musculoskeletal: Strength & Muscle Tone: within normal limits Gait & Station: normal Patient leans:  normal  Psychiatric Specialty Exam: Review of Systems  Constitutional: Negative.   HENT: Negative.   Eyes: Negative.   Respiratory: Negative.   Cardiovascular: Negative.   Gastrointestinal: Negative.   Genitourinary: Negative.   Musculoskeletal: Negative.   Skin: Negative.   Neurological: Negative.   Endo/Heme/Allergies: Negative.   Psychiatric/Behavioral: Positive for depression and substance abuse. The patient is nervous/anxious.     Blood pressure 124/97, pulse 76, temperature 98 F (36.7 C), temperature source Oral, resp. rate 16, height 5' 7.25" (1.708 m), weight 107.502 kg (237 lb).Body mass index is 36.85 kg/(m^2).  General Appearance: Fairly Groomed  Engineer, water::  Fair  Speech:  Clear and Coherent  Volume:  fluctuates  Mood:  Anxious and Depressed  Affect:  anxious depressed worried  Thought Process:  Coherent and Goal Directed  Orientation:  Full (Time, Place, and Person)  Thought Content:  symptoms events worries concerns  Suicidal Thoughts:  No  Homicidal Thoughts:  No  Memory:  Immediate;   Fair Recent;   Fair Remote;   Fair  Judgement:  Fair  Insight:  Present  Psychomotor Activity:  Normal  Concentration:  Fair  Recall:  [employer] of Knowledge:Fair  Language: Fair  Akathisia:  No  Handed:  Right  AIMS (if indicated):     Assets:  Desire for Improvement  ADL's:  Intact  Cognition: WNL  Sleep:  Number of Hours: 5.5   Treatment Plan Summary: Daily contact with patient to assess and evaluate symptoms and progress in treatment and Medication management Supportive approach/coping skills Alcohol cocaine abuse; continue to work a relapse prevention plan Depression; continue the Prozac 20 mg with Abilify augmentation Insomnia; continue the Trazodone 100 mg HS Work with CBT/mindfluness Explore residential treatment options Kishia Shackett A, MD 10/21/2015, 8:10 PM

## 2015-10-21 NOTE — Progress Notes (Signed)
Recreation Therapy Notes  Date: 02.06.2017 Time: 9:30am Location: [address] Group Room   Group Topic: Stress Management  Goal Area(s) Addresses:  Patient will actively participate in stress management techniques presented during session.   Behavioral Response: Did not attend.   Laureen Ochs Aiman Noe, LRT/CTRS        Lane Hacker 10/21/2015 3:32 PM

## 2015-10-22 NOTE — B[facility] Group Notes (Signed)
[facility] Group Notes:  (Nursing/MHT/Case Management/Adjunct)  Date:  10/22/2015  Time:  10:58 AM  Type of Therapy:  Psychoeducational Skills  Participation Level:  Did Not Attend  Participation Quality:  N/A  Affect:  N/A  Cognitive:  N/A  Insight:  None  Engagement in Group:  None  Modes of Intervention:  Discussion and Education  Summary of Progress/Problems: Patient was invited to group but did not attend.  Gaylan Gerold E 10/22/2015, 10:58 AM

## 2015-10-22 NOTE — B[facility] Group Notes (Signed)
[facility] LCSW Group Therapy  10/22/2015 1:22 PM  Type of Therapy:  Group Therapy  Participation Level:  Active  Participation Quality:  Attentive  Affect:  Appropriate  Cognitive:  Alert and Oriented  Insight:  Improving  Engagement in Therapy:  Improving  Modes of Intervention:  Discussion, Education, Exploration, Problem-solving, Rapport Building, Socialization and Support  Summary of Progress/Problems: MHA Speaker came to talk about his personal journey with substance abuse and addiction. The pt processed ways by which to relate to the speaker. Flora speaker provided handouts and educational information pertaining to groups and services offered by the [facility].   Smart, Kenyon Eshleman LCSW 10/22/2015, 1:22 PM

## 2015-10-22 NOTE — Progress Notes (Signed)
Recreation Therapy Notes  Animal-Assisted Activity (AAA) Program Checklist/Progress Notes Patient Eligibility Criteria Checklist & Daily Group note for Rec Tx Intervention  Date: 02.07.2017 Time: 2:45pm Location: [address]    AAA/T Program Assumption of Risk Form signed by Patient/ or Parent Legal Guardian yes  Patient is free of allergies or sever asthma yes  Patient reports no fear of animals yes  Patient reports no history of cruelty to animals yes  Patient understands his/her participation is voluntary yes  Behavioral Response: Did not attend.   Laureen Ochs [place], LRT/CTRS        Amoni Scallan L 10/22/2015 3:04 PM

## 2015-10-22 NOTE — Plan of Care (Signed)
Problem: Ineffective individual coping Goal: STG: Patient will remain free from self harm Outcome: Progressing Richard [name] remains free from self harm, denies SI

## 2015-10-22 NOTE — Progress Notes (Signed)
D. Pt had been in room and in bed for much of the evening, very little interaction or participation in milieu. Pt did appear depressed and withdrawn and reports on-going feelings of depression. Pt did not report any complaints of pain and chose not to receive evening sleep medication as he reported he did not want it. A. Support and encouragement provided. R. Safety maintained, will continue to monitor.

## 2015-10-22 NOTE — Progress Notes (Signed)
Patient ID: Richard [name], male   DOB: [birth year], 50 y.o.   MRN: [id number]  DAR: Pt. Denies SI/HI and A/V Hallucinations. He reports sleep is fair, appetite is fair, and energy level is low. He rates depression, anxiety, and hopelessness 8/10. Patient does not report any pain or discomfort at this time. Support and encouragement provided to the patient. Scheduled medications administered to patient per physician's orders. Patient is receptive and cooperative bt minimal with Probation officer. Q15 minute checks are maintained for safety.

## 2015-10-22 NOTE — Progress Notes (Signed)
[facility] MD Progress Note  10/22/2015 7:13 PM Richard [name]  MRN:  [id number] Subjective:  Richard [name] is still unsure of what is going to happen once he gets out. He states his son has information but he has tried to stay out of it. He is aware of the consequences if he was to hurt the guy. States he is aware that he has to be right for his son's sake. State he is his son's only support and he wants to be there for him. He would still like to go to [facility] what would give him more time to get right Principal Problem: MDD (major depressive disorder), recurrent episode, severe ([place]) Diagnosis:   Patient Active Problem List   Diagnosis Date Noted  . Severe episode of recurrent major depressive disorder, without psychotic features ([place]) [F33.2]   . MDD (major depressive disorder), recurrent episode, severe ([place]) [F33.2] 10/17/2015  . Cocaine abuse [F14.10] 10/17/2015  . Alcohol abuse [F10.10] 10/17/2015  . Severe single current episode of major depressive disorder, with psychotic features ([place]) [F32.3]   . Substance induced mood disorder ([place]) [F19.94] 10/08/2015  . Colostomy complication ([place]) [Y85.02]   . Colon cancer s/p chemo + radiation and resection. Now with colostomy bag [C18.9] 04/10/2015  . SVT (supraventricular tachycardia) ([place]) [I47.1] 04/10/2015  . Tobacco abuse [Z72.0] 04/10/2015   Total Time spent with patient: 20 minutes  Past Psychiatric History: see admission H and P  Past Medical History:  Past Medical History  Diagnosis Date  . Colon cancer ([place])   . Colon cancer ([place]) 2015  . Colostomy complication ([place]) 77-41-28    blood in colostomy bag    Past Surgical History  Procedure Laterality Date  . Partial colectomy  06/2014    done at [facility]   Family History:  Family History  Problem Relation Age of Onset  . Coronary artery disease Cousin 66   Family Psychiatric  History: see admission H and P Social History:  History  Alcohol Use  . 0.6 oz/week  . 1 Cans of beer per week     Comment: drinks 2 times a week     History  Drug Use  . Yes  . Special: Marijuana, Cocaine    Comment: Lst used 3 days ago    Social History   Social History  . Marital Status: Single    Spouse Name: N/A  . Number of Children: N/A  . Years of Education: N/A   Social History Main Topics  . Smoking status: Current Every Day Smoker -- 0.50 packs/day    Types: Cigarettes  . Smokeless tobacco: Never Used  . Alcohol Use: 0.6 oz/week    1 Cans of beer per week     Comment: drinks 2 times a week  . Drug Use: Yes    Special: Marijuana, Cocaine     Comment: Lst used 3 days ago  . Sexual Activity: No   Other Topics Concern  . None   Social History Narrative   Additional Social History:    Pain Medications: See PTA med list Prescriptions: See PTA med list Over the Counter: See PTA med list History of alcohol / drug use?: Yes Longest period of sobriety (when/how long): Was sober for 1 week after discharge from [facility] on 10/08/15 Negative Consequences of Use: Financial, Personal relationships, Work / Youth worker, Scientist, research (physical sciences) Withdrawal Symptoms:  (Denies) Name of Substance 1: Alcohol 1 - Age of First Use: Adolescent 1 - Amount (size/oz): 6-12 cans of beer 1 - Frequency:  Daily when available 1 - Duration: Ongoing for years 1 - Last Use / Amount: 10/17/15, several beers Name of Substance 2: Cocaine 2 - Age of First Use: 22 2 - Amount (size/oz): Varies 2 - Frequency: Daily when available 2 - Duration: Ongoing for years 2 - Last Use / Amount: 10/17/15, $20 worth Name of Substance 3: Marijuana 3 - Age of First Use: Adolescent 3 - Amount (size/oz): varies 3 - Frequency: Daily when available 3 - Duration: Ongoing for years 3 - Last Use / Amount: 10/17/15, amount unknown              Sleep: Fair  Appetite:  Fair  Current Medications: Current Facility-Administered Medications  Medication Dose Route Frequency Provider Last Rate Last Dose  . acetaminophen (TYLENOL) tablet 650 mg   650 mg Oral Q6H PRN Harriet Butte, NP      . alum & mag hydroxide-simeth (MAALOX/MYLANTA) 200-200-20 MG/5ML suspension 30 mL  30 mL Oral Q4H PRN Harriet Butte, NP   30 mL at 10/20/15 2311  . ARIPiprazole (ABILIFY) tablet 5 mg  5 mg Oral Daily Harriet Butte, NP   5 mg at 10/22/15 0754  . FLUoxetine (PROZAC) capsule 20 mg  20 mg Oral Daily Harriet Butte, NP   20 mg at 10/22/15 0754  . hydrOXYzine (ATARAX/VISTARIL) tablet 25 mg  25 mg Oral Q6H PRN Harriet Butte, NP      . magnesium hydroxide (MILK OF MAGNESIA) suspension 30 mL  30 mL Oral Daily PRN Harriet Butte, NP      . nicotine polacrilex (NICORETTE) gum 2 mg  2 mg Oral PRN Nicholaus Bloom, MD      . traZODone (DESYREL) tablet 100 mg  100 mg Oral QHS Harriet Butte, NP   100 mg at 10/20/15 2243    Lab Results: No results found for this or any previous visit (from the past 48 hour(s)).  Physical Findings: AIMS: Facial and Oral Movements Muscles of Facial Expression: None, normal Lips and Perioral Area: None, normal Jaw: None, normal Tongue: None, normal,Extremity Movements Upper (arms, wrists, hands, fingers): None, normal Lower (legs, knees, ankles, toes): None, normal, Trunk Movements Neck, shoulders, hips: None, normal, Overall Severity Severity of abnormal movements (highest score from questions above): None, normal Incapacitation due to abnormal movements: None, normal Patient's awareness of abnormal movements (rate only patient's report): No Awareness, Dental Status Current problems with teeth and/or dentures?: No Does patient usually wear dentures?: No  CIWA:    COWS:     Musculoskeletal: Strength & Muscle Tone: within normal limits Gait & Station: normal Patient leans: normal  Psychiatric Specialty Exam: Review of Systems  Constitutional: Negative.   HENT: Negative.   Eyes: Negative.   Respiratory: Negative.   Cardiovascular: Negative.   Gastrointestinal:       Colonostomy  Genitourinary: Negative.    Musculoskeletal: Negative.   Skin: Negative.   Neurological: Negative.   Endo/Heme/Allergies: Negative.   Psychiatric/Behavioral: Positive for depression and substance abuse. The patient is nervous/anxious.     Blood pressure 123/87, pulse 85, temperature 97.5 F (36.4 C), temperature source Oral, resp. rate 18, height 5' 7.25" (1.708 m), weight 107.502 kg (237 lb).Body mass index is 36.85 kg/(m^2).  General Appearance: Fairly Groomed  Engineer, water::  Fair  Speech:  Clear and Coherent  Volume:  Normal  Mood:  Anxious and sad worried  Affect:  anxious worried  Thought Process:  Coherent and Goal Directed  Orientation:  Full (Time, Place, and Person)  Thought Content:  symptoms events worries concerns  Suicidal Thoughts:  No  Homicidal Thoughts:  No  Memory:  Immediate;   Fair Recent;   Fair Remote;   Fair  Judgement:  Fair  Insight:  Present  Psychomotor Activity:  Decreased  Concentration:  Fair  Recall:  [employer] of Knowledge:Fair  Language: Fair  Akathisia:  No  Handed:  Right  AIMS (if indicated):     Assets:  Desire for Improvement  ADL's:  Intact  Cognition: WNL  Sleep:  Number of Hours: 7   Treatment Plan Summary: Daily contact with patient to assess and evaluate symptoms and progress in treatment and Medication management  Supportive approach/coping skills Alcohol/cocaine dependence; continue to work a relapse prevention plan Depression; continue the Prozac 20 mg with Abilify augmentation Work with CBT/mindfulness Help process the loss of his friend  Explore residential treatment options  , A, MD 10/22/2015, 7:13 PM

## 2015-10-23 MED ORDER — NICOTINE POLACRILEX 2 MG MT GUM: 2.0000 mg | CHEWING_GUM | OROMUCOSAL | Status: DC | PRN

## 2015-10-23 MED ORDER — HYDROXYZINE HCL 25 MG PO TABS: 25.0000 mg | ORAL_TABLET | Freq: Four times a day (QID) | ORAL | Status: DC | PRN

## 2015-10-23 MED ORDER — TRAZODONE HCL 100 MG PO TABS: 100.0000 mg | ORAL_TABLET | Freq: Every day | ORAL | Status: DC

## 2015-10-23 MED ORDER — FLUOXETINE HCL 20 MG PO CAPS: 20.0000 mg | ORAL_CAPSULE | Freq: Every day | ORAL | Status: DC

## 2015-10-23 MED ORDER — ARIPIPRAZOLE 5 MG PO TABS: 5.0000 mg | ORAL_TABLET | Freq: Every day | ORAL | Status: DC

## 2015-10-23 NOTE — Tx Team (Signed)
Interdisciplinary Treatment Plan Update (Adult)  Date:  10/23/2015  Time Reviewed:  11:16 AM   Progress in Treatment: Attending groups: Yes Participating in groups:  Yes Taking medication as prescribed:  Yes. Tolerating medication:  Yes. Family/Significant othe contact made:  SPE completed with pt, as he refused to consent to family contact.  Patient understands diagnosis:  Yes. and As evidenced by:  seeking treatment for SI, depression, relapse on cocaine, marijuana, and alcohol upon d/c from [facility] Discussing patient identified problems/goals with staff:  Yes. Medical problems stabilized or resolved:  Yes. Denies suicidal/homicidal ideation: No.Passive Si/no plan.  Issues/concerns per patient self-inventory:  Other:  Discharge Plan or Barriers:  Pt accepted to [facility] for today.   Reason for Continuation of Hospitalization: none  Comments:  Richard [name] is an 50 y.o. male presenting as a walk-in to [facility] for worsening depression, along with SI/HI. His son reportedly brought him to the ED because he was worried about him. Pt was just discharged from [facility] yesterday but states that he learned of his best friend's murder just 4 hours after leaving [facility]. He states that he knows the man who murdered his friend because he was a mutual friend of the two. Pt reports feeling angry, confused, and betrayed. He has been having thoughts of completing a murder-suicide, killing his friend's murderer and then taking his own life. However, he says he has no plan or intent to act on these thoughts. Pt says "It took a lot for me to come back here [to [place], but I'm scared of what I'll do". Pt goes on to say that he's been hurting so badly that he relapsed and used cocaine, marijuana, and alcohol since he was discharged yesterday. Pt reports a long hx of substance abuse and went to [facility] for treatment in 2016. Pt also c/o insomnia, hopelessness, fatigue, guilt, crying spells, increased anger, anhedonia, social isolation, and  lack of motivation. His only hx of psychiatric hospitalization is his admission to [facility] last month.He is now homeless and stays with friends when possible. He reports financial stressors, housing problems, and family stressors. He has never seen a psychiatrist or counselor for treatment. He adds that he never followed-up with mental health tx and never filled the meds prescribed by [facility] on his last admission because he had no time or funds to do so (he was just d/c on 10/08/15); therefore, he has not been taking any psychiatric medication since his d/c. He denies having any social supports and denies any hx of physical, emotional, or sexual abuse. He denies A/VH and psychotic sx Diagnosis:  296.33 Major depressive disorder, Recurrent, Severe 304.20 Cocaine use disorder, Moderate to Severe 303.90 Alcohol use disorder, Moderate 304.30 Cannabis use disorder, Moderate  Estimated length of stay:  D/c today   Additional Comments:  Patient and CSW reviewed pt's identified goals and treatment plan. Patient verbalized understanding and agreed to treatment plan. CSW reviewed [facility] "Discharge Process and Patient Involvement" Form. Pt verbalized understanding of information provided and signed form.    Review of initial/current patient goals per problem list:  1. Goal(s): Patient will participate in aftercare plan  Met: Yes  Target date: at discharge  As evidenced by: Patient will participate within aftercare plan AEB aftercare provider and housing plan at discharge being identified.  2/3: CSW assessing for appropriate referrals.   2/8: Pt accepted to [facility] for today.   2. Goal (s): Patient will exhibit decreased depressive symptoms and suicidal ideations.  Met:Yes   Target  date: at discharge  As evidenced by: Patient will utilize self rating of depression at 3 or below and demonstrate decreased signs of depression or be deemed stable for discharge by MD.  2/3: Pt rates depression as high  and endorses passive SI. No HI/AVH.   2/8: Pt rates depression as 2/10 and presents with pleasant mood/calm affect. Denies SI/HI/AVH.   3. Goal(s): Patient will demonstrate decreased signs of withdrawal due to substance abuse  Met:Yes.   Target date:at discharge   As evidenced by: Patient will produce a CIWA/COWS score of 0, have stable vitals signs, and no symptoms of withdrawal.  2/3: Pt reports no signs of withdrawal with CIWA/COWS of 0 and stable vitals.   Attendees: Patient:   10/23/2015 11:16 AM   Family:   10/23/2015 11:16 AM   Physician:  Dr. Carlton Adam, MD 10/23/2015 11:16 AM   Nursing:   Ashok Pall RN  10/23/2015 11:16 AM   Clinical Social Worker: Maxie Better, LCSW 10/23/2015 11:16 AM   Clinical Social Worker: Erasmo Downer Drinkard LCSWA; Peri Maris LCSWA 10/23/2015 11:16 AM   Other:  Gerline Legacy Nurse Case Manager 10/23/2015 11:16 AM   Other:   10/23/2015 11:16 AM   Other:   10/23/2015 11:16 AM   Other:  10/23/2015 11:16 AM   Other:  10/23/2015 11:16 AM   Other:  10/23/2015 11:16 AM    10/23/2015 11:16 AM    10/23/2015 11:16 AM    10/23/2015 11:16 AM    10/23/2015 11:16 AM    Scribe for Treatment Team:   Maxie Better, LCSW 10/23/2015 11:16 AM

## 2015-10-23 NOTE — Progress Notes (Signed)
  [facility] Adult Case Management Discharge Plan :  Will you be returning to the same living situation after discharge:  No.Pt accepted directly to [facility] at discharge. At discharge, do you have transportation home?: Yes,  [facility] will transport patient to facility at 1:00PM Do you have the ability to pay for your medications: Yes,  mental health  Release of information consent forms completed and submitted to medical records by CSW.  Patient to Follow up at: Follow-up Information    Follow up with [facility].   Why:  Patient accepted for admission on this date at 2:00PM. Driver will pick you up at 1:00PM to transport you to facility. Please make sure to have 2 week supply of medications.    Contact information:   [address] [place], [place] [address] Phone: [phone] Fax: [phone]      Follow up with [facility].   Why:  Walk in between 8am-9am Monday through Friday for hospital follow-up/medication management/assessment for counseling services.    Contact information:   [address], [place] [address] Phone: [phone] Fax: [phone]      Next level of care provider has access to [place] and Suicide Prevention discussed: Yes,  SPE completed with pt, as he refused to consent to family contact.  Have you used any form of tobacco in the last 30 days? (Cigarettes, Smokeless Tobacco, Cigars, and/or Pipes): Yes  Has patient been referred to the Quitline?: Patient refused referral  Patient has been referred for addiction treatment: Yes  Smart, Peretz Thieme LCSW 10/23/2015, 11:15 AM

## 2015-10-23 NOTE — Progress Notes (Signed)
Patient ID: Richard [name], male   DOB: [birth year], 50 y.o.   MRN: [id number]  Pt. Denies SI/HI and A/V hallucinations. Belongings returned to patient at time of discharge. Discharge instructions and medications were reviewed with patient. All required documents given to patient. Supplies for colostomy given to patient. Patient verbalized understanding of both medications and discharge instructions. Patient was escorted to lobby where [facility] was picking up. No distress upon discharge. Q15 minute safety checks maintained until discharge.

## 2015-10-23 NOTE — Discharge Summary (Signed)
Physician Discharge Summary Note  Patient:  Richard [name] is an 50 y.o., male  MRN:  [id number]  DOB:  07/03/1966  Patient phone:  [phone] (home)   Patient address:   [place] [address],   Total Time spent with patient: Greater than 30 minutes  Date of Admission:  10/17/2015  Date of Discharge: 10-23-15  Reason for Admission: Worsening symptoms of depression/suicidal ideations  Principal Problem: MDD (major depressive disorder), recurrent episode, severe [facility])  Discharge Diagnoses: Patient Active Problem List   Diagnosis Date Noted  . Severe episode of recurrent major depressive disorder, without psychotic features ([place]) [F33.2]   . MDD (major depressive disorder), recurrent episode, severe ([place]) [F33.2] 10/17/2015  . Cocaine abuse [F14.10] 10/17/2015  . Alcohol abuse [F10.10] 10/17/2015  . Severe single current episode of major depressive disorder, with psychotic features ([place]) [F32.3]   . Substance induced mood disorder ([place]) [F19.94] 10/08/2015  . 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 Colon cancer s/p chemo + radiation and resection. Now with colostomy bag [C18.9] 04/10/2015  . SVT (supraventricular tachycardia) ([place]) [I47.1] 04/10/2015  . Tobacco abuse [Z72.0] 04/10/2015   Past Psychiatric History: Major depression  Past Medical History:  Past Medical History  Diagnosis Date  . Colon cancer ([place])   . Colon cancer ([place]) 2015  . Colostomy complication ([place]) 25-05-39    blood in colostomy bag    Past Surgical History  Procedure Laterality Date  . Partial colectomy  06/2014    done at [facility]   Family History:  Family History  Problem Relation Age of Onset  . Coronary artery disease Cousin 41   Family Psychiatric  History: See H&P  Social History:  History  Alcohol Use  . 0.6 oz/week  . 1 Cans of beer per week    Comment: drinks 2 times a week     History  Drug Use  . Yes  . Special: Marijuana, Cocaine    Comment: Lst  used 3 days ago    Social History   Social History  . Marital Status: Single    Spouse Name: N/A  . Number of Children: N/A  . Years of Education: N/A   Social History Main Topics  . Smoking status: Current Every Day Smoker -- 0.50 packs/day    Types: Cigarettes  . Smokeless tobacco: Never Used  . Alcohol Use: 0.6 oz/week    1 Cans of beer per week     Comment: drinks 2 times a week  . Drug Use: Yes    Special: Marijuana, Cocaine     Comment: Lst used 3 days ago  . Sexual Activity: No   Other Topics Concern  . None   Social History Narrative   Hospital Course: 50 Y/O male who was D/C from our unit on 10-16-15. He initiailly went to the ED for chest pain and later complained of Depression, SI and substance abuse. He was placed on Prozac and Abilify and D/C home stable. He states that shortly after he left he found out that one of his best friends was murdered by a mutual friend. States he could not take it. He was so "messed up" in his mind that he relapsed as he could not handled it, he wanted to kill the friend and kill himself. He went to see his son at [employer] & he took him back to [facility] ED,  Richard [name] was readmitted to the [facility] adult unit with his UDS test reports showing positive Cocaine &  THC. Although, claimed he relapsed on alcohol as well few hours after his [facility] discharge, his BAL was <5 on arrival to the ED. He presented as his reason for readmission as; feeling of anger, confusion & betrayal. He stated that he was having thoughts of completing a murder-suicide, killing his friend's murderer and then taking his own life. On another note, he stated that he had no plans or intent to act on those thoughts. After evaluation of his symptoms, Richard [name] was restarted on medication regimen for his presenting symptoms. He was resumed on;  Abilify5 mg daily for mood control/adjunct treatment for depression, Prozac 20 mg daily for depression, Hydroxyzine 25 mg for anxiety & Trazodone 100 mg daily for  sleep. He was re-enrolled & participated in the group counseling sessions being offered and held on this unit, he learned coping skills that should help him cope better & maintain mood stability after discharge. He presented no other significant pre-existing health issues that required treatment and or monitoring. He self-managed  his colostomy. He tolerated his treatment regimen without any significant adverse effects & or reactions.  Richard [name]'s symptoms were evaluated on daily basis by a clinical provider to assure his symptoms are responding to his treatment regimen, & they were. This is evidenced by his reports of decreasing symptoms, improved mood, absence of SIHI, AVH & presentation of good affect.  It is also quite obvious that Richard [name] has a significant substance abuse problems & will benefit from further substance abuse treatment after discharge. So, his discharge plan included a referral & an appointment to a residential treatment facility to continue substance abuse treatment. He is currently being discharged to [facility] for psychiatric treatment/medication management as noted below. He is provided with all the pertinent information required to make this appointment without problems.   On this day of his hospital discharge, Richard [name] is in much improved condition than upon admission. He contracted for his safety & felt more in control of his mood. His symptoms were reported as significantly decreased or resolved completely. He denies any SIHI & voiced no AVH. He is instructed & motivated to continue taking medications with a goal of continued improvement in mental health. Richard [name] was provided with a 14 days worth supply samples of his [facility] discharge medications. He was picked up by [facility]. He left [facility] in no apparent distress with all belongings.  Physical Findings:  AIMS: Facial and Oral Movements Muscles of Facial Expression: None, normal Lips and Perioral Area: None, normal Jaw: None, normal Tongue: None,  normal,Extremity Movements Upper (arms, wrists, hands, fingers): None, normal Lower (legs, knees, ankles, toes): None, normal, Trunk Movements Neck, shoulders, hips: None, normal, Overall Severity Severity of abnormal movements (highest score from questions above): None, normal Incapacitation due to abnormal movements: None, normal Patient's awareness of abnormal movements (rate only patient's report): No Awareness, Dental Status Current problems with teeth and/or dentures?: No Does patient usually wear dentures?: No  CIWA:    COWS:     Musculoskeletal: Strength & Muscle Tone: within normal limits Gait & Station: normal Patient leans: N/A  Psychiatric Specialty Exam: Review of Systems  Constitutional: Negative.   HENT: Negative.   Eyes: Negative.   Respiratory: Negative.   Cardiovascular: Negative.   Gastrointestinal: Negative.   Genitourinary: Negative.   Musculoskeletal: Negative.   Skin: Negative.   Neurological: Negative.   Endo/Heme/Allergies: Negative.   Psychiatric/Behavioral: Positive for depression (Stable), hallucinations (Hx or (auditory)) and substance abuse (Hx Alcohol, cocaine, tobacco THC abuse). Negative for suicidal  ideas and memory loss. The patient has insomnia (Stable). The patient is not nervous/anxious.     Blood pressure 105/85, pulse 76, temperature 97.5 F (36.4 C), temperature source Oral, resp. rate 18, height 5' 7.25" (1.708 m), weight 107.502 kg (237 lb).Body mass index is 36.85 kg/(m^2).  See Md's SRA   Have you used any form of tobacco in the last 30 days? (Cigarettes, Smokeless Tobacco, Cigars, and/or Pipes): Yes  Has this patient used any form of tobacco in the last 30 days? (Cigarettes, Smokeless Tobacco, Cigars, and/or Pipes): Yes, A prescription for an FDA-approved tobacco cessation medication was offered at discharge and the patient refused  Metabolic Disorder Labs:  No results found for: HGBA1C, MPG No results found for: PROLACTIN No  results found for: CHOL, TRIG, HDL, CHOLHDL, VLDL, LDLCALC  See Psychiatric Specialty Exam and Suicide Risk Assessment completed by Attending Physician prior to discharge.  Discharge destination:  [facility]  Is patient on multiple antipsychotic therapies at discharge:  No   Has Patient had three or more failed trials of antipsychotic monotherapy by history:  No  Recommended Plan for Multiple Antipsychotic Therapies: NA    Medication List    Notice    You have not been prescribed any medications.     Follow-up Information    Follow up with [facility].   Why:  Patient accepted for admission on this date at 2:00PM. Driver will pick you up at 1:00PM to transport you to facility. Please make sure to have 2 week supply of medications.    Contact information:   [address] [place], [place] [address] Phone: [phone] Fax: [phone]      Follow up with [facility].   Why:  Walk in between 8am-9am Monday through Friday for hospital follow-up/medication management/assessment for counseling services.    Contact information:   [address],  [address] Phone: [phone] Fax: [phone]     Follow-up recommendations: Activity:  As tolerated Diet: As recommended by your primary care doctor. Keep all scheduled follow-up appointments as recommended.    Comments: Take all your medications as prescribed by your mental healthcare provider. Report any adverse effects and or reactions from your medicines to your outpatient provider promptly. Patient is instructed and cautioned to not engage in alcohol and or illegal drug use while on prescription medicines. In the event of worsening symptoms, patient is instructed to call the crisis hotline, 911 and or go to the nearest ED for appropriate evaluation and treatment of symptoms. Follow-up with your primary care provider for your other medical issues, concerns and or health care needs.   Signed: Encarnacion Slates, NP,  PMHNP, FNP-BC 10/23/2015, 11:11 AM  I agree with assessment and plan Woodroe Chen. Sabra Heck, M.D.

## 2015-10-23 NOTE — Progress Notes (Signed)
D. Pt has been up and visible in milieu this evening, seen interacting appropriately with peers. Pt reports that he is still feeling depressed but reports that he is doing better today. Pt did speak briefly about how he is unsure of what to do when he gets discharged. Pt did receive bedtime medication without incident and did not verbalize any complaints of pain. A. Support and encouragement provided. R. Safety maintained, will continue to monitor.

## 2015-10-23 NOTE — [facility] Suicide Risk Assessment (Signed)
[facility] Discharge Suicide Risk Assessment   Principal Problem: MDD (major depressive disorder), recurrent episode, severe ([place]) Discharge Diagnoses:  Patient Active Problem List   Diagnosis Date Noted  . Severe episode of recurrent major depressive disorder, without psychotic features ([place]) [F33.2]   . MDD (major depressive disorder), recurrent episode, severe ([place]) [F33.2] 10/17/2015  . Cocaine abuse [F14.10] 10/17/2015  . Alcohol abuse [F10.10] 10/17/2015  . Severe single current episode of major depressive disorder, with psychotic features ([place]) [F32.3]   . Substance induced mood disorder ([place]) [F19.94] 10/08/2015  . Colostomy complication ([place]) [id number]   . Colon cancer s/p chemo + radiation and resection. Now with colostomy bag [C18.9] 04/10/2015  . SVT (supraventricular tachycardia) ([place]) [I47.1] 04/10/2015  . Tobacco abuse [Z72.0] 04/10/2015    Total Time spent with patient: 20 minutes  Musculoskeletal: Strength & Muscle Tone: within normal limits Gait & Station: normal Patient leans: normal  Psychiatric Specialty Exam: Review of Systems  Constitutional: Negative.   HENT: Negative.   Eyes: Negative.   Respiratory: Negative.   Cardiovascular: Negative.   Gastrointestinal: Negative.   Genitourinary: Negative.   Musculoskeletal: Negative.   Skin: Negative.   Neurological: Negative.   Endo/Heme/Allergies: Negative.   Psychiatric/Behavioral: Positive for substance abuse.    Blood pressure 105/85, pulse 76, temperature 97.5 F (36.4 C), temperature source Oral, resp. rate 18, height 5' 7.25" (1.708 m), weight 107.502 kg (237 lb).Body mass index is 36.85 kg/(m^2).  General Appearance: Fairly Groomed  Engineer, water::  Fair  Speech:  Clear and [id number]  Volume:  Normal  Mood:  Euthymic  Affect:  Appropriate  Thought Process:  Coherent and Goal Directed  Orientation:  Full (Time, Place, and Person)  Thought Content:  plans as he moves on, relapse prevention plan   Suicidal Thoughts:  No  Homicidal Thoughts:  No  Memory:  Immediate;   Fair Recent;   Fair Remote;   Fair  Judgement:  Fair  Insight:  Present  Psychomotor Activity:  Normal  Concentration:  Fair  Recall:  [employer] of Knowledge:Fair  Language: Fair  Akathisia:  No  Handed:  Right  AIMS (if indicated):     Assets:  Desire for Improvement  Sleep:  Number of Hours: 6.5  Cognition: WNL  ADL's:  Intact  In full contact with reality. There are no active SI plans or intent. He is willing and motivated to pursue further residential treatment. Will be going to [facility]. States he found more details about what happened between his friends and in part feels it was justified. He harbors no ill feelings. Mental Status Per Nursing Assessment::   On Admission:     Demographic Factors:  Male  Loss Factors: Decline in physical health  Historical Factors: NA  Risk Reduction Factors:   Sense of responsibility to family  Continued Clinical Symptoms:  Depression:   Comorbid alcohol abuse/dependence Alcohol/Substance Abuse/Dependencies  Cognitive Features That Contribute To Risk:  None    Suicide Risk:  Minimal: No identifiable suicidal ideation.  Patients presenting with no risk factors but with morbid ruminations; may be classified as minimal risk based on the severity of the depressive symptoms  Follow-up Information    Follow up with [facility].   Why:  Patient accepted for admission on this date at 2:00PM. Driver will pick you up at 1:00PM to transport you to facility. Please make sure to have 2 week supply of medications.    Contact information:   [address], [place]  [place] Phone: [phone] Fax: [phone]      Follow up with [facility].   Why:  Walk in between 8am-9am Monday through Friday for hospital follow-up/medication management/assessment for counseling services.    Contact information:   [address] [place] [address] Phone:  [phone] Fax: [phone]      Plan Of Care/Follow-up recommendations:  Activity:  as tolerated Diet:  regular Follow up [facility] as above Ayan Heffington A, MD 10/23/2015, 12:14 PM

## 2015-10-23 NOTE — Progress Notes (Signed)
Recreation Therapy Notes  Date: 02.08.2017  Time: 9:30am Location: [address] Group Room   Group Topic: Stress Management  Goal Area(s) Addresses:  Patient will actively participate in stress management techniques presented during session.   Behavioral Response: Did not attend.   Laureen Ochs Tiamarie Furnari, LRT/CTRS        Eiley Mcginnity L 10/23/2015 2:40 PM

## 2015-10-23 NOTE — [facility] Group Notes (Signed)
[facility] LCSW Aftercare Discharge Planning Group Note   10/23/2015 9:36 AM  Participation Quality:  DID NOT ATTEND. Pt had scheduled phone screening with [facility] during group this morning and was excused.   Smart, Cumi Sanagustin LCSW

## 2015-11-15 ENCOUNTER — Encounter (HOSPITAL_COMMUNITY): Payer: Self-pay | Admitting: *Deleted

## 2015-11-15 ENCOUNTER — Emergency Department (HOSPITAL_COMMUNITY)
Admission: EM | Admit: 2015-11-15 | Discharge: 2015-11-16 | Disposition: A | Discharge status: Other Acute Inpatient Hospital | Payer: Medicaid Other | Attending: Emergency Medicine | Admitting: Emergency Medicine

## 2015-11-15 DIAGNOSIS — F17210 Nicotine dependence, cigarettes, uncomplicated: Secondary | ICD-10-CM | POA: Diagnosis not present

## 2015-11-15 DIAGNOSIS — Z85038 Personal history of other malignant neoplasm of large intestine: Secondary | ICD-10-CM | POA: Diagnosis not present

## 2015-11-15 DIAGNOSIS — R45851 Suicidal ideations: Secondary | ICD-10-CM | POA: Diagnosis not present

## 2015-11-15 DIAGNOSIS — F1410 Cocaine abuse, uncomplicated: Secondary | ICD-10-CM | POA: Diagnosis not present

## 2015-11-15 DIAGNOSIS — F329 Major depressive disorder, single episode, unspecified: Secondary | ICD-10-CM | POA: Diagnosis not present

## 2015-11-15 DIAGNOSIS — Z008 Encounter for other general examination: Secondary | ICD-10-CM | POA: Diagnosis present

## 2015-11-15 DIAGNOSIS — F1210 Cannabis abuse, uncomplicated: Secondary | ICD-10-CM | POA: Insufficient documentation

## 2015-11-15 DIAGNOSIS — Z79899 Other long term (current) drug therapy: Secondary | ICD-10-CM | POA: Insufficient documentation

## 2015-11-15 DIAGNOSIS — F32A Depression, unspecified: Secondary | ICD-10-CM

## 2015-11-15 LAB — COMPREHENSIVE METABOLIC PANEL
ALT: 42 U/L (ref 17–63)
AST: 35 U/L (ref 15–41)
Albumin: 4.3 g/dL (ref 3.5–5.0)
Alkaline Phosphatase: 61 U/L (ref 38–126)
Anion gap: 10 (ref 5–15)
BUN: 18 mg/dL (ref 6–20)
CO2: 23 mmol/L (ref 22–32)
Calcium: 9 mg/dL (ref 8.9–10.3)
Chloride: 103 mmol/L (ref 101–111)
Creatinine, Ser: 1.23 mg/dL (ref 0.61–1.24)
GFR calc Af Amer: >60 mL/min (ref >60)
GFR calc non Af Amer: >60 mL/min (ref >60)
Glucose, Bld: 102 mg/dL — ABNORMAL HIGH (ref 65–99)
Potassium: 3.6 mmol/L (ref 3.5–5.1)
Sodium: 136 mmol/L (ref 135–145)
Total Bilirubin: 0.5 mg/dL (ref 0.3–1.2)
Total Protein: 7.6 g/dL (ref 6.5–8.1)

## 2015-11-15 LAB — CBC WITH DIFFERENTIAL/PLATELET
Basophils Absolute: 0 10*3/uL (ref 0.0–0.1)
Basophils Relative: 0 %
Eosinophils Absolute: 0.3 10*3/uL (ref 0.0–0.7)
Eosinophils Relative: 6 %
HCT: 39.4 % (ref 39.0–52.0)
Hemoglobin: 13.2 g/dL (ref 13.0–17.0)
Lymphocytes Relative: 13 %
Lymphs Abs: 0.6 10*3/uL — ABNORMAL LOW (ref 0.7–4.0)
MCH: 29.9 pg (ref 26.0–34.0)
MCHC: 33.5 g/dL (ref 30.0–36.0)
MCV: 89.1 fL (ref 78.0–100.0)
Monocytes Absolute: 0.4 10*3/uL (ref 0.1–1.0)
Monocytes Relative: 9 %
Neutro Abs: 3.4 10*3/uL (ref 1.7–7.7)
Neutrophils Relative %: 72 %
Platelets: 279 10*3/uL (ref 150–400)
RBC: 4.42 MIL/uL (ref 4.22–5.81)
RDW: 14.9 % (ref 11.5–15.5)
WBC: 4.8 10*3/uL (ref 4.0–10.5)

## 2015-11-15 LAB — RAPID URINE DRUG SCREEN, HOSP PERFORMED
Amphetamines: NOT DETECTED
Barbiturates: NOT DETECTED
Benzodiazepines: NOT DETECTED
Cocaine: POSITIVE — AB
Opiates: NOT DETECTED
Tetrahydrocannabinol: POSITIVE — AB

## 2015-11-15 LAB — ETHANOL: Alcohol, Ethyl (B): <5 mg/dL (ref <5)

## 2015-11-15 MED ORDER — ARIPIPRAZOLE 5 MG PO TABS
5.0000 mg | ORAL_TABLET | Freq: Every day | ORAL | Status: DC
  Administered 2015-11-15: 5 mg via ORAL
  Filled 2015-11-15: qty 1

## 2015-11-15 MED ORDER — TRAZODONE HCL 100 MG PO TABS: 100.0000 mg | ORAL_TABLET | Freq: Every day | ORAL | Status: DC

## 2015-11-15 MED ORDER — LORAZEPAM 1 MG PO TABS: 1.0000 mg | ORAL_TABLET | Freq: Three times a day (TID) | ORAL | Status: DC | PRN

## 2015-11-15 MED ORDER — ACETAMINOPHEN 325 MG PO TABS: 650.0000 mg | ORAL_TABLET | ORAL | Status: DC | PRN

## 2015-11-15 MED ORDER — HYDROXYZINE HCL 25 MG PO TABS: 25.0000 mg | ORAL_TABLET | Freq: Four times a day (QID) | ORAL | Status: DC | PRN

## 2015-11-15 MED ORDER — ONDANSETRON HCL 4 MG PO TABS: 4.0000 mg | ORAL_TABLET | Freq: Three times a day (TID) | ORAL | Status: DC | PRN

## 2015-11-15 MED ORDER — FLUOXETINE HCL 20 MG PO CAPS
20.0000 mg | ORAL_CAPSULE | Freq: Every day | ORAL | Status: DC
  Administered 2015-11-15: 20 mg via ORAL
  Filled 2015-11-15: qty 1

## 2015-11-15 NOTE — ED Notes (Signed)
Pt. Noted sleeping in room. No complaints or concerns voiced. No distress or abnormal behavior noted. Will continue to monitor with security cameras. Q 15 minute rounds continue. 

## 2015-11-15 NOTE — Progress Notes (Signed)
Entered in d/c instructions  [facility] medicine Schedule an appointment as soon as possible for a visit As needed This is your assigned Medicaid [place] access doctor If you prefer another contact [place] assigned your doctor *You may receive a bill if you go to any family Dr not assigned to you Medicaid [place] access pcp response hx states [place] [address] [place], [place] [address] [phone] Medicaid [place] Access Covered Patient [place]: [phone] [address] [place], [place] [address] [email or web address] Use this website to assist with understanding your coverage & to renew application As a Medicaid client you MUST contact DSS/SSI each time you change address, move to another [place] or another state to keep your address updated  [place] Medicaid Transportation to Dr appts if you are have full Medicaid: [phone], [place] Call As needed  [address] [place],  [address]  [phone]

## 2015-11-15 NOTE — ED Notes (Signed)
Bed: [facility] Expected date:  Expected time:  Means of arrival:  Comments: Med Clearance

## 2015-11-15 NOTE — ED Notes (Signed)
Report received from [place]. Pt. Sleeping with respirations regular and unlabored. Will continue to monitor for safety via security cameras and Q 15 minute checks.

## 2015-11-15 NOTE — Progress Notes (Signed)
Medicaid [place] access pcp response hx states [facility] [address] [place], [place] [address] [phone]

## 2015-11-15 NOTE — ED Notes (Signed)
Report to Diane RN, pt to go to room 37

## 2015-11-15 NOTE — ED Notes (Signed)
Pt came to the ED reporting AH and SI. He is A&O x3, cooperataive and calm. He has been asked to give a urine sample, but has not done so at this time. Currently he is sleeping and was given a meal on his arrival.

## 2015-11-15 NOTE — ED Notes (Addendum)
Pt  Reports to EMS colostomy bag come off and he is hearing voices. Pt states he started hearing voices yesterday to hurt himself "just do it, just do it"

## 2015-11-15 NOTE — BH Assessment (Signed)
Writer discussed patient with Dr. Jerilee Hoh an at this point, an admission will enforce his Maladaptive Behaviors. Patient was inpatient with [facility] January 2017 and February 2017 and it appears he hasn't followed up with his outpatient provider. Advise patient to follow up with outpatient provider.

## 2015-11-15 NOTE — ED Notes (Signed)
Colostomy bag applied to stoma

## 2015-11-15 NOTE — BH Assessment (Signed)
Assessment Note  Richard [name] is an 50 y.o. male. Patient was brought into the ED by EMS because of issues with his colostomy and suicidal ideation with psychosis.  Patient continues to endorse suicidal ideations with plan and command hallucinations.  Patient reports drinking a gallon liquor daily and last drinking one day ago.  Patient was admitted twice in the last 60 days at [facility] for similar symptoms.    This Probation officer consulted with Dr. Darleene Cleaver, it is recommended to refer for inpatient hospitalization for safety.    Diagnosis: Substance Induced Mood Disorder  Past Medical History:  Past Medical History  Diagnosis Date  . Colon cancer ([place])   . Colon cancer ([place]) 2015  . 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 Partial colectomy  06/2014    done at [facility]    Family History:  Family History  Problem Relation Age of Onset  . Coronary artery disease Cousin 19    Social History:  reports that he has been smoking Cigarettes.  He has been smoking about 0.50 packs per day. He has never used smokeless tobacco. He reports that he drinks about 0.6 oz of alcohol per week. He reports that he uses illicit drugs (Marijuana and Cocaine).  Additional Social History:  Alcohol / Drug Use Pain Medications: see chart Prescriptions: see chart Over the Counter: see chart History of alcohol / drug use?: Yes Longest period of sobriety (when/how long): 1 year 2015 Negative Consequences of Use: Work / Youth worker, Personal relationships Withdrawal Symptoms: Diarrhea, Sweats, Tingling, Tremors, Irritability, Nausea / Vomiting, Weakness Substance #1 Name of Substance 1: Alcohol 1 - Amount (size/oz): gallon 1 - Frequency: daily 1 - Duration: ongoing 1 - Last Use / Amount: yesterday  CIWA: CIWA-Ar BP: 106/68 mmHg Pulse Rate: 99 Nausea and Vomiting: no nausea and no vomiting Tactile Disturbances: none Tremor: no  tremor Auditory Disturbances: not present Paroxysmal Sweats: no sweat visible Visual Disturbances: not present Anxiety: no anxiety, at ease Headache, Fullness in Head: none present Agitation: normal activity Orientation and Clouding of Sensorium: oriented and can do serial additions CIWA-Ar Total: 0 COWS:    Allergies:  Allergies  Allergen Reactions  . Morphine And Related Itching    Home Medications:  (Not in a hospital admission)  OB/GYN Status:  No LMP for male patient.  General Assessment Data [facility] T[facility] Assessment: In system Is this a Tele or Face-to-Face Assessment?: Face-to-Face Is this an Initial Assessment or a Re-assessment for this encounter?: Initial Assessment Marital status: Single Maiden name: na Is patient pregnant?: No Pregnancy Status: No Living Arrangements: Alone Can pt return to current living arrangement?: Yes Admission Status: Voluntary Is patient capable of signing voluntary admission?: Yes Referral Source: Self/Family/Friend Insurance type: mcd  Medical Screening Exam ([place]) Medical Exam completed: Yes  Crisis Care Plan Living Arrangements: Alone Name of Psychiatrist: None Name of Therapist: None  Education Status Is patient currently in school?: No Highest grade of school patient has completed: Some college Name of school: NA Contact person: NA  Risk to self with the past 6 months Suicidal Ideation: Yes-Currently Present Has patient been a risk to self within the past 6 months prior to admission? : Yes Suicidal Intent: No-Not Currently/Within Last 6 Months Has patient had any suicidal intent within the past 6 months prior to admission? : No Is patient at risk for suicide?: Yes Suicidal Plan?:  Yes-Currently Present Has patient had any suicidal plan within the past 6 months prior to admission? : No Specify Current Suicidal Plan: possible plans but refused to disclose Access to Means: No (unknown at  this time) What has been your use of drugs/alcohol within the last 12 months?: alcohol Previous Attempts/Gestures: No How many times?: 0 Other Self Harm Risks: na Triggers for Past Attempts: Family contact, Hallucinations, Other (Comment) Intentional Self Injurious Behavior: None Family Suicide History: No Recent stressful life event(s): Conflict (Comment), Loss (Comment), Financial Problems, Recent negative physical changes Persecutory voices/beliefs?: No Depression: Yes Depression Symptoms: Insomnia, Isolating, Fatigue, Guilt, Loss of interest in usual pleasures, Feeling worthless/self pity, Feeling angry/irritable (hopelessness) Substance abuse history and/or treatment for substance abuse?: Yes Suicide prevention information given to non-admitted patients: Not applicable  Risk to Others within the past 6 months Homicidal Ideation: No-Not Currently/Within Last 6 Months Does patient have any lifetime risk of violence toward others beyond the six months prior to admission? : No Thoughts of Harm to Others: No-Not Currently Present/Within Last 6 Months Current Homicidal Intent: No-Not Currently/Within Last 6 Months Current Homicidal Plan: No-Not Currently/Within Last 6 Months Access to Homicidal Means: No Identified Victim: na History of harm to others?: No Assessment of Violence: None Noted Does patient have access to weapons?: No Criminal Charges Pending?: No Does patient have a court date: No Is patient on probation?: No  Psychosis Hallucinations: Auditory Delusions: None noted  Mental Status Report Appearance/Hygiene: In scrubs Eye Contact: Poor Motor Activity: Freedom of movement Speech: Logical/coherent Level of Consciousness: Drowsy Mood: Depressed Affect: Depressed Anxiety Level: None Thought Processes: Relevant Judgement: Unimpaired Orientation: Person, Place, Time, Situation Obsessive Compulsive Thoughts/Behaviors: None  Cognitive Functioning Concentration:  Poor Memory: Recent Intact, Remote Intact IQ: Average Insight: Fair Impulse Control: Fair Appetite: Poor Weight Loss: 0 Weight Gain: 0 Sleep: Decreased Total Hours of Sleep: 3 Vegetative Symptoms: None  ADLScreening [facility] Assessment Services) Patient's cognitive ability adequate to safely complete daily activities?: Yes Patient able to express need for assistance with ADLs?: Yes Independently performs ADLs?: Yes (appropriate for developmental age)  Prior Inpatient Therapy Prior Inpatient Therapy: Yes Prior Therapy Dates: 2016, 2017 Prior Therapy Facilty/Provider(s): [facility], [facility] Reason for Treatment: substance abuse  Prior Outpatient Therapy Prior Outpatient Therapy: No Prior Therapy Dates: NA Prior Therapy Facilty/Provider(s): NA Reason for Treatment: NA Does patient have an ACCT team?: No Does patient have Intensive In-House Services?  : No Does patient have [facility] services? : No Does patient have P4CC services?: No  ADL Screening (condition at time of admission) Patient's cognitive ability adequate to safely complete daily activities?: Yes Patient able to express need for assistance with ADLs?: Yes Independently performs ADLs?: Yes (appropriate for developmental age)       Abuse/Neglect Assessment (Assessment to be complete while patient is alone) Physical Abuse: Denies Verbal Abuse: Denies Sexual Abuse: Denies Exploitation of patient/patient's resources: Denies Self-Neglect: Denies Values / Beliefs Cultural Requests During Hospitalization: None Spiritual Requests During Hospitalization: None Consults Spiritual Care Consult Needed: No Social Work Consult Needed: No Regulatory affairs officer (For Healthcare) Does patient have an advance directive?: No    Additional Information 1:1 In Past 12 Months?: No CIRT Risk: No Elopement Risk: No Does patient have medical clearance?: Yes     Disposition:  Disposition Initial Assessment Completed for this Encounter:  Yes Disposition of Patient: Inpatient treatment program Type of inpatient treatment program: Adult  On Site Evaluation by:   Reviewed with Physician:    Chesley Noon A 11/15/2015 11:12  AM

## 2015-11-16 ENCOUNTER — Encounter (HOSPITAL_COMMUNITY): Payer: Self-pay | Admitting: *Deleted

## 2015-11-16 ENCOUNTER — Inpatient Hospital Stay (HOSPITAL_COMMUNITY)
Admission: AD | Admit: 2015-11-16 | Discharge: 2015-11-26 | DRG: 885 | Disposition: A | Discharge status: Home / Self Care | Payer: Medicaid Other | Source: Intra-hospital | Attending: Psychiatry | Admitting: Psychiatry

## 2015-11-16 DIAGNOSIS — M79604 Pain in right leg: Secondary | ICD-10-CM | POA: Diagnosis present

## 2015-11-16 DIAGNOSIS — Z9221 Personal history of antineoplastic chemotherapy: Secondary | ICD-10-CM | POA: Diagnosis not present

## 2015-11-16 DIAGNOSIS — F1420 Cocaine dependence, uncomplicated: Secondary | ICD-10-CM | POA: Diagnosis present

## 2015-11-16 DIAGNOSIS — F1210 Cannabis abuse, uncomplicated: Secondary | ICD-10-CM | POA: Diagnosis present

## 2015-11-16 DIAGNOSIS — G4700 Insomnia, unspecified: Secondary | ICD-10-CM | POA: Diagnosis present

## 2015-11-16 DIAGNOSIS — R45851 Suicidal ideations: Secondary | ICD-10-CM | POA: Diagnosis not present

## 2015-11-16 DIAGNOSIS — Z923 Personal history of irradiation: Secondary | ICD-10-CM

## 2015-11-16 DIAGNOSIS — F419 Anxiety disorder, unspecified: Secondary | ICD-10-CM | POA: Diagnosis present

## 2015-11-16 DIAGNOSIS — R51 Headache: Secondary | ICD-10-CM | POA: Diagnosis present

## 2015-11-16 DIAGNOSIS — F333 Major depressive disorder, recurrent, severe with psychotic symptoms: Secondary | ICD-10-CM | POA: Diagnosis not present

## 2015-11-16 DIAGNOSIS — Z933 Colostomy status: Secondary | ICD-10-CM | POA: Diagnosis not present

## 2015-11-16 DIAGNOSIS — F1410 Cocaine abuse, uncomplicated: Secondary | ICD-10-CM | POA: Diagnosis not present

## 2015-11-16 DIAGNOSIS — Z8249 Family history of ischemic heart disease and other diseases of the circulatory system: Secondary | ICD-10-CM

## 2015-11-16 DIAGNOSIS — F1010 Alcohol abuse, uncomplicated: Secondary | ICD-10-CM | POA: Diagnosis present

## 2015-11-16 DIAGNOSIS — Z9049 Acquired absence of other specified parts of digestive tract: Secondary | ICD-10-CM

## 2015-11-16 DIAGNOSIS — Z85038 Personal history of other malignant neoplasm of large intestine: Secondary | ICD-10-CM | POA: Diagnosis not present

## 2015-11-16 DIAGNOSIS — F17210 Nicotine dependence, cigarettes, uncomplicated: Secondary | ICD-10-CM | POA: Diagnosis present

## 2015-11-16 DIAGNOSIS — F329 Major depressive disorder, single episode, unspecified: Secondary | ICD-10-CM | POA: Insufficient documentation

## 2015-11-16 MED ORDER — HYDROXYZINE HCL 25 MG PO TABS
25.0000 mg | ORAL_TABLET | Freq: Four times a day (QID) | ORAL | Status: DC | PRN
  Administered 2015-11-18: 25 mg via ORAL
  Filled 2015-11-16: qty 1

## 2015-11-16 MED ORDER — NICOTINE 14 MG/24HR TD PT24
14.0000 mg | MEDICATED_PATCH | Freq: Every day | TRANSDERMAL | Status: DC
  Filled 2015-11-16 (×3): qty 1

## 2015-11-16 MED ORDER — FLUOXETINE HCL 20 MG PO CAPS
20.0000 mg | ORAL_CAPSULE | Freq: Every day | ORAL | Status: DC
  Administered 2015-11-16 – 2015-11-26 (×11): 20 mg via ORAL
  Filled 2015-11-16 (×13): qty 1

## 2015-11-16 MED ORDER — MAGNESIUM HYDROXIDE 400 MG/5ML PO SUSP: 30.0000 mL | Freq: Every day | ORAL | Status: DC | PRN

## 2015-11-16 MED ORDER — ARIPIPRAZOLE 5 MG PO TABS
5.0000 mg | ORAL_TABLET | Freq: Every day | ORAL | Status: DC
Start: 2015-11-16 — End: 2015-11-18
  Administered 2015-11-16 – 2015-11-18 (×3): 5 mg via ORAL
  Filled 2015-11-16 (×5): qty 1

## 2015-11-16 MED ORDER — FLUOXETINE HCL 20 MG/5ML PO SOLN
20.0000 mg | Freq: Every day | ORAL | Status: DC
Start: 2015-11-16 — End: 2015-11-16
  Filled 2015-11-16 (×2): qty 5

## 2015-11-16 MED ORDER — ACETAMINOPHEN 325 MG PO TABS
650.0000 mg | ORAL_TABLET | Freq: Four times a day (QID) | ORAL | Status: DC | PRN
  Administered 2015-11-16 – 2015-11-19 (×5): 650 mg via ORAL
  Filled 2015-11-16 (×5): qty 2

## 2015-11-16 MED ORDER — ALUM & MAG HYDROXIDE-SIMETH 200-200-20 MG/5ML PO SUSP: 30.0000 mL | ORAL | Status: DC | PRN

## 2015-11-16 MED ORDER — TRAZODONE HCL 100 MG PO TABS
100.0000 mg | ORAL_TABLET | Freq: Every day | ORAL | Status: DC
  Administered 2015-11-16 – 2015-11-25 (×9): 100 mg via ORAL
  Filled 2015-11-16 (×15): qty 1

## 2015-11-16 NOTE — Progress Notes (Signed)
D) Pt has been in his room all shift sleeping. States "I am depressed and I haven't slept. I need to sleep". Has not attended the groups and meals have been brought to his room. Affect is flat and mood depressed. Rates his depression, hopelessness and helplessness all at a 9. Admits to thoughts of SI. Denies HI.  A) Verbal contract with Pt for his safety while on the unit. Given support, reassurance and praise when appropriate. Gently confronted Pt about his withdrawal and sleeping much of the shift.  R) Contracts for his safety while on the unit. States "I am just very depressed and need to sleep today".

## 2015-11-16 NOTE — Progress Notes (Signed)
Patient did attend the evening speaker AA meeting.  

## 2015-11-16 NOTE — Tx Team (Signed)
Initial Interdisciplinary Treatment Plan   PATIENT STRESSORS: Health problems Occupational concerns Substance abuse   PATIENT STRENGTHS: Average or above average intelligence Capable of independent living Communication skills General fund of knowledge   PROBLEM LIST: Problem List/Patient Goals Date to be addressed Date deferred Reason deferred Estimated date of resolution  "I was having some suicidal thoughts"  November 17, 2015     "was using cocaine, alcohol, and marijuana" Nov 17, 2015     "couple of situations" "things getting lot worse"  17-Nov-2015     "voices" 11/17/15     "death of my family members, still dealing with that" 11/17/2015                              DISCHARGE CRITERIA:  Adequate post-discharge living arrangements Improved stabilization in mood, thinking, and/or behavior Motivation to continue treatment in a less acute level of care Need for constant or close observation no longer present Reduction of life-threatening or endangering symptoms to within safe limits Verbal commitment to aftercare and medication compliance  PRELIMINARY DISCHARGE PLAN: Attend aftercare/continuing care group Outpatient therapy Placement in alternative living arrangements  PATIENT/FAMIILY INVOLVEMENT: This treatment plan has been presented to and reviewed with the patient, Richard [name], and/or family membe.  The patient and family have been given the opportunity to ask questions and make suggestions.  Richard, [name] 11/17/15, 4:06 AM

## 2015-11-16 NOTE — BHH Suicide Risk Assessment (Signed)
[place] INPATIENT:  Family/Significant Other Suicide Prevention Education  Suicide Prevention Education:  Patient Refusal for Family/Significant Other Suicide Prevention Education: The patient Richard [name] has refused to provide written consent for family/significant other to be provided Family/Significant Other Suicide Prevention Education during admission and/or prior to discharge.  Physician notified.  Lysle Dingwall 11/16/2015, 4:12 PM

## 2015-11-16 NOTE — BHH Suicide Risk Assessment (Signed)
[facility] Admission Suicide Risk Assessment   Nursing information obtained from:  Patient Demographic factors:  Male, Low socioeconomic status, Unemployed Current Mental Status:  Suicidal ideation indicated by patient Loss Factors:  Loss of significant relationship, Decline in physical health Historical Factors:  Family history of suicide, Family history of mental illness or substance abuse Risk Reduction Factors:  Sense of responsibility to family, Positive social support  Total Time spent with patient: 45 minutes Principal Problem:  Major Depression, Recurrent, Psychotic Symptoms, Cocaine Abuse, Alcohol Abuse  Diagnosis:   Patient Active Problem List   Diagnosis Date Noted  . MDD (major depressive disorder) ([place]) [F32.9] 11/16/2015  . Severe episode of recurrent major depressive disorder, without psychotic features ([place]) [F33.2]   . MDD (major depressive disorder), recurrent episode, severe ([place]) [F33.2] 10/17/2015  . Cocaine abuse [F14.10] 10/17/2015  . Alcohol abuse [F10.10] 10/17/2015  . Severe single current episode of major depressive disorder, with psychotic features ([place]) [F32.3]   . Substance induced mood disorder ([place]) [F19.94] 10/08/2015  . 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 Colon cancer s/p chemo + radiation and resection. Now with colostomy bag [C18.9] 04/10/2015  . SVT (supraventricular tachycardia) ([place]) [I47.1] 04/10/2015  . Tobacco abuse [Z72.0] 04/10/2015     Continued Clinical Symptoms:  Alcohol Use Disorder Identification Test Final Score (AUDIT): 4 The "Alcohol Use Disorders Identification Test", Guidelines for Use in Primary Care, Second Edition.  World Pharmacologist [facility]). Score between 0-7:  no or low risk or alcohol related problems. Score between 8-15:  moderate risk of alcohol related problems. Score between 16-19:  high risk of alcohol related problems. Score 20 or above:  warrants further diagnostic evaluation for alcohol dependence and  treatment.   CLINICAL FACTORS:  50 year old male, known to our unit from prior psychiatric admissions. He has a history of depression and substance abuse, with a history of cocaine, alcohol abuse . Most recently  had been admitted recently from 2/2 - 10/23/15 for depression. On discharge he was referred to [facility] which he completed successfully . He states " I finished it about a week ago". States he has experiencing auditory hallucinations, mostly unintelligible , sometimes " I can make words here and there, but they are angry voices ". He has also been experiencing worsening depression, mostly passive thoughts of " being better off dead".  He states he used cocaine and alcohol only one day , on 2/28, but states the hallucinations had been occuring even before his drug use . Of note, UDS positive for cocaine and cannabis, BAL negative  He states he has been compliant with Prozac and Abilify. Dx- Major Depression, Recurrent, with Psychotic Features , Cocaine Abuse, Cannabis  Abuse Plan - Inpatient admission, continue Abilify 5 mgrs QDAY, Prozac 20 mgrs QDAY      Musculoskeletal: Strength & Muscle Tone: within normal limits Gait & Station: normal Patient leans: N/A  Psychiatric Specialty Exam: ROS denies chest pain, no shortness of breath, has permanent  Colostomy, no rash, no fever   Blood pressure 106/70, pulse 79, temperature 98 F (36.7 C), temperature source Oral, resp. rate 16, height 5' 6.75" (1.695 m), weight 251 lb (113.853 kg).Body mass index is 39.63 kg/(m^2).  General Appearance: Fairly Groomed  Engineer, water::  Good  Speech:  Normal Rate  Volume:  Decreased  Mood:  Depressed  Affect:  Constricted  Thought Process:  Linear  Orientation:  Other:  fully alert and attentive   Thought Content:  reports auditory hallucinations, at  this time does not appear internally preoccupied , no delusions expressed   Suicidal Thoughts:  Yes.  without intent/plan at this time denies any suicidal  plan or intention and contracts for safety on the unit   Homicidal Thoughts:  No  Memory:  recent and remote grossly intact   Judgement:  Fair  Insight:  Fair  Psychomotor Activity:  Decreased  Concentration:  Good  Recall:  Good  Fund of Knowledge:Good  Language: Good  Akathisia:  Negative  Handed:  Right  AIMS (if indicated):     Assets:  Desire for Improvement Resilience  Sleep:  Number of Hours: 4  Cognition: WNL  ADL's:  Intact    COGNITIVE FEATURES THAT CONTRIBUTE TO RISK:  Closed-mindedness and Loss of executive function    SUICIDE RISK:   Moderate:  Frequent suicidal ideation with limited intensity, and duration, some specificity in terms of plans, no associated intent, good self-control, limited dysphoria/symptomatology, some risk factors present, and identifiable protective factors, including available and accessible social support.  PLAN OF CARE: Patient will be admitted to inpatient psychiatric unit for stabilization and safety. Will provide and encourage milieu participation. Provide medication management and maked adjustments as needed.  Will follow daily.    I certify that inpatient services furnished can reasonably be expected to improve the patient's condition.   Neita Garnet, MD 11/16/2015, 4:51 PM

## 2015-11-16 NOTE — BHH Group Notes (Signed)
[place] Group Notes: (Clinical Social Work)   11/16/2015      Type of Therapy:  Group Therapy   Participation Level:  Did Not Attend despite MHT prompting   Selmer Dominion, LCSW 11/16/2015, 11:05 AM

## 2015-11-16 NOTE — Progress Notes (Signed)
Writer has observed patient up in the dayroom watching tv and minimal to no interaction with peers. He attended [place] meeting and was coplinat with his scheduled medication. He currently does not have a goal he is working on before discharge. He reports passive si and verbally contracts for safety, denies hi and visual,+ auditory hallucinations. Support and encouragement given, safety maintained on unit with 15 min checks.

## 2015-11-16 NOTE — Progress Notes (Signed)
Patient ID: Richard [name], male   DOB: 1966/04/08, 50 y.o.   MRN: [id number] Client is a 50 yo male admitted with SI, no plan, contrats for safety, reports reason for admission "couple of situations" "things getting lot worse" Client also reports use of cocaine, marijuana, and alcohol, but only acknowledges "one time". "I hear voices" "still dealing with death of my family members" Client was living with friend "don't want to go back there" "they use" Client has a medical history of colon cancer, currently has a colostomy. Client is cooperative, ready for bed, complains of chronic leg pain. [place] will monitor q64min for safety. Client is safe on the unit.

## 2015-11-16 NOTE — H&P (Signed)
Psychiatric Admission Assessment Adult  Patient Identification: Richard [name] MRN:  [id number] Date of Evaluation:  11/16/2015 Chief Complaint:  mdd Principal Diagnosis: <principal problem not specified> Diagnosis:   Patient Active Problem List   Diagnosis Date Noted  . Severe episode of recurrent major depressive disorder, without psychotic features ([place]) [F33.2]   . MDD (major depressive disorder), recurrent episode, severe ([place]) [F33.2] 10/17/2015  . Cocaine abuse [F14.10] 10/17/2015  . Alcohol abuse [F10.10] 10/17/2015  . Severe single current episode of major depressive disorder, with psychotic features ([place]) [F32.3]   . Substance induced mood disorder ([place]) [F19.94] 10/08/2015  . 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 Colon cancer s/p chemo + radiation and resection. Now with colostomy bag [C18.9] 04/10/2015  . SVT (supraventricular tachycardia) ([place]) [I47.1] 04/10/2015  . Tobacco abuse [Z72.0] 04/10/2015   History of Present Illness:Per Assessment Note on 3/3/ 2017-Richard [name] is an 50 y.o. male. Patient was brought into the ED by EMS because of issues with his colostomy and suicidal ideation with psychosis. Patient continues to endorse suicidal ideations with plan and command hallucinations. Patient reports drinking a gallon liquor daily and last drinking one day ago. Patient was admitted twice in the last 60 days at [facility] for similar symptoms.   On evaluation: Richard [name] is awake, oriented X4, patient is resting in bedroom with eye closed throughout this discussion. Reports suicidal ideations  that are chronic in nature patient does contract for safety while on the unit. Patient denies homicidal ideation today, Denies visual hallucination and does not appear to be responding to internal stimuli. Patient reports a recent discharged from [facility] and the [facility] reports that he is medication compliant without mediation side effects. Reports that he dose take all prescribed medication  as written. States his depression 10/10 due to a lot of stressors patient doesn't care to elaborated at this time. Support, encouragement and reassurance was provided.   Associated Signs/Symptoms: Depression Symptoms:  Depressed mood lack of energy lack of motivation tired crying but also irritable angry (Hypo) Manic Symptoms:  Irritable Mood, Labiality of Mood, Anxiety Symptoms:  Excessive Worry, Psychotic Symptoms:  "noises" when he uses cocaine PTSD Symptoms: Negative Total Time spent with patient: 30 minutes  Past Psychiatric History:   Risk to Self: Is patient at risk for suicide?: Yes Risk to Others:   Prior Inpatient Therapy:   Prior Outpatient Therapy:   [facility] to [facility] has not been able to go to an appointment as he was readmitted Alcohol Screening: 1. How often do you have a drink containing alcohol?: Monthly or less 2. How many drinks containing alcohol do you have on a typical day when you are drinking?: 3 or 4 3. How often do you have six or more drinks on one occasion?: Less than monthly Preliminary Score: 2 4. How often during the last year have you found that you were not able to stop drinking once you had started?: Never 5. How often during the last year have you failed to do what was normally expected from you becasue of drinking?: Less than monthly 6. How often during the last year have you needed a first drink in the morning to get yourself going after a heavy drinking session?: Never 8. How often during the last year have you been unable to remember what happened the night before because you had been drinking?: Never 9. Have you or someone else been injured as a result of your drinking?: No 10. Has a relative or friend or  a doctor or another health worker been concerned about your drinking or suggested you cut down?: No Alcohol Use Disorder Identification Test Final Score (AUDIT): 4 Brief Intervention: Yes Substance Abuse History in the last 12 months:  Yes.    Consequences of Substance Abuse: Blackouts:   Previous Psychotropic Medications: Yes Prozac Abilify ( states they were helping while he was here) Psychological Evaluations: No  Past Medical History:  Past Medical History  Diagnosis Date  . Colon cancer ([place])   . Colon cancer ([place]) 2015  . Colostomy complication ([place]) 45-80-99    blood in colostomy bag    Past Surgical History  Procedure Laterality Date  . Partial colectomy  06/2014    done at [facility]   Family History:  Family History  Problem Relation Age of Onset  . Coronary artery disease Cousin 43   Family Psychiatric  History: cousin committed suicide, has 2 sisters with substance abuse Tobacco Screening: _0 ([phone])::1)@ Social History:  History  Alcohol Use  . 0.6 oz/week  . 1 Cans of beer per week    Comment: drinks 2 times a week     History  Drug Use  . Yes  . Special: Marijuana, Cocaine    Comment: Lst used 3 days ago   Single on disability lives by himself has 2 sons. Past history of colon cancer with colon ostomy. Has not been able to work Additional Social History:                           Allergies:   Allergies  Allergen Reactions  . Morphine And Related Itching   Lab Results:  Results for orders placed or performed during the hospital encounter of 11/15/15 (from the past 48 hour(s))  Comprehensive metabolic panel     Status: Abnormal   Collection Time: 11/15/15 10:04 AM  Result Value Ref Range   Sodium 136 135 - 145 mmol/L   Potassium 3.6 3.5 - 5.1 mmol/L   Chloride 103 101 - 111 mmol/L   CO2 23 22 - 32 mmol/L   Glucose, Bld 102 (H) 65 - 99 mg/dL   BUN 18 6 - 20 mg/dL   Creatinine, Ser 1.23 0.61 - 1.24 mg/dL   Calcium 9.0 8.9 - 10.3 mg/dL   Total Protein 7.6 6.5 - 8.1 g/dL   Albumin 4.3 3.5 - 5.0 g/dL   AST 35 15 - 41 U/L   ALT 42 17 - 63 U/L   Alkaline Phosphatase 61 38 - 126 U/L   Total Bilirubin 0.5 0.3 - 1.2 mg/dL   GFR calc non Af Amer >60 >60 mL/min   GFR calc Af  Amer >60 >60 mL/min    Comment: (NOTE) The eGFR has been calculated using the CKD EPI equation. This calculation has not been validated in all clinical situations. eGFR's persistently <60 mL/min signify possible Chronic Kidney Disease.    Anion gap 10 5 - 15  Ethanol     Status: None   Collection Time: 11/15/15 10:04 AM  Result Value Ref Range   Alcohol, Ethyl (B) <5 <5 mg/dL    Comment:        LOWEST DETECTABLE LIMIT FOR SERUM ALCOHOL IS 5 mg/dL FOR MEDICAL PURPOSES ONLY   CBC with Diff     Status: Abnormal   Collection Time: 11/15/15 10:04 AM  Result Value Ref Range   WBC 4.8 4.0 - 10.5 K/uL   RBC 4.42 4.22 - 5.81 MIL/uL  Hemoglobin 13.2 13.0 - 17.0 g/dL   HCT 39.4 39.0 - 52.0 %   MCV 89.1 78.0 - 100.0 fL   MCH 29.9 26.0 - 34.0 pg   MCHC 33.5 30.0 - 36.0 g/dL   RDW 14.9 11.5 - 15.5 %   Platelets 279 150 - 400 K/uL   Neutrophils Relative % 72 %   Neutro Abs 3.4 1.7 - 7.7 K/uL   Lymphocytes Relative 13 %   Lymphs Abs 0.6 (L) 0.7 - 4.0 K/uL   Monocytes Relative 9 %   Monocytes Absolute 0.4 0.1 - 1.0 K/uL   Eosinophils Relative 6 %   Eosinophils Absolute 0.3 0.0 - 0.7 K/uL   Basophils Relative 0 %   Basophils Absolute 0.0 0.0 - 0.1 K/uL  Urine rapid drug screen (hosp performed)not at [facility]     Status: Abnormal   Collection Time: 11/15/15  3:40 PM  Result Value Ref Range   Opiates NONE DETECTED NONE DETECTED   Cocaine POSITIVE (A) NONE DETECTED   Benzodiazepines NONE DETECTED NONE DETECTED   Amphetamines NONE DETECTED NONE DETECTED   Tetrahydrocannabinol POSITIVE (A) NONE DETECTED   Barbiturates NONE DETECTED NONE DETECTED    Comment:        DRUG SCREEN FOR MEDICAL PURPOSES ONLY.  IF CONFIRMATION IS NEEDED FOR ANY PURPOSE, NOTIFY LAB WITHIN 5 DAYS.        LOWEST DETECTABLE LIMITS FOR URINE DRUG SCREEN Drug Class       Cutoff (ng/mL) Amphetamine      1000 Barbiturate      200 Benzodiazepine   200 Tricyclics       300 Opiates          300 Cocaine           300 THC              50     Metabolic Disorder Labs:  No results found for: HGBA1C, MPG No results found for: PROLACTIN No results found for: CHOL, TRIG, HDL, CHOLHDL, VLDL, LDLCALC  Current Medications: Current Facility-Administered Medications  Medication Dose Route Frequency Provider Last Rate Last Dose  . acetaminophen (TYLENOL) tablet 650 mg  650 mg Oral Q6H PRN Fernando A Cobos, MD   650 mg at 11/16/15 0159  . ARIPiprazole (ABILIFY) tablet 5 mg  5 mg Oral Daily Fernando A Cobos, MD   5 mg at 11/16/15 1044  . FLUoxetine (PROZAC) capsule 20 mg  20 mg Oral Daily Irving A Lugo, MD   20 mg at 11/16/15 1046  . hydrOXYzine (ATARAX/VISTARIL) tablet 25 mg  25 mg Oral Q6H PRN Fernando A Cobos, MD      . nicotine (NICODERM CQ - dosed in mg/24 hours) patch 14 mg  14 mg Transdermal Daily Fernando A Cobos, MD   14 mg at 11/16/15 1046  . traZODone (DESYREL) tablet 100 mg  100 mg Oral QHS Fernando A Cobos, MD   100 mg at 11/16/15 0159   PTA Medications: Prescriptions prior to admission  Medication Sig Dispense Refill Last Dose  . ARIPiprazole (ABILIFY) 5 MG tablet Take 1 tablet (5 mg total) by mouth daily. For mood control 30 tablet 0 Past Week at Unknown time  . FLUoxetine (PROZAC) 20 MG capsule Take 1 capsule (20 mg total) by mouth daily. For depression 30 capsule 0 Past Week at Unknown time  . hydrOXYzine (ATARAX/VISTARIL) 25 MG tablet Take 1 tablet (25 mg total) by mouth every 6 (six) hours as needed for anxiety. 60 tablet   0 Unknown at Unknown time  . traZODone (DESYREL) 100 MG tablet Take 1 tablet (100 mg total) by mouth at bedtime. For sleep 30 tablet 0 Unknown at Unknown time    Musculoskeletal: Strength & Muscle Tone: within normal limits Gait & Station: normal Patient leans: normal  Psychiatric Specialty Exam: Physical Exam  Nursing note and vitals reviewed. Constitutional: He is oriented to person, place, and time. He appears well-developed.  HENT:  Head: Normocephalic.  Neck:  Neck supple.  Musculoskeletal: Normal range of motion.  Neurological: He is alert and oriented to person, place, and time.  Psychiatric: He has a normal mood and affect. His behavior is normal.    Review of Systems  Psychiatric/Behavioral: Positive for depression, suicidal ideas, hallucinations and substance abuse. The patient is nervous/anxious and has insomnia.   All other systems reviewed and are negative.   Blood pressure 113/74, pulse 75, temperature 98 F (36.7 C), temperature source Oral, resp. rate 16, height 5' 6.75" (1.695 m), weight 113.853 kg (251 lb).Body mass index is 39.63 kg/(m^2).  General Appearance: Disheveled  Eye Contact::  Minimal  Speech:  Clear and Coherent and Slow  Volume:  Decreased  Mood:  Anxious and Depressed  Affect:  Depressed and anxious worried  Thought Process:  Coherent and Goal Directed  Orientation:  Full (Time, Place, and Person)  Thought Content:  symptoms events worries concerns  Suicidal Thoughts:  Yes with no intent or plan and patient agrees to contract for safety while on the unit  Homicidal Thoughts:No, Homicidal ideations  Memory:  Immediate;   Fair Recent;   Fair Remote;   Fair  Judgement:  Fair  Insight:  Present and Shallow  Psychomotor Activity:  Decrease  Concentration:  Fair  Recall:  Fair  Fund of Knowledge:Fair  Language: Fair  Akathisia:  No  Handed:  Right  AIMS (if indicated):     Assets:  Desire for Improvement Social Support  ADL's:  Intact  Cognition: WNL  Sleep:  Number of Hours: 4    Treatment Plan Summary: Daily contact with patient to assess and evaluate symptoms and progress in treatment and Medication management Supportive approach/coping skills Coine abuse-dependence; group session and management of relapse/pervention  Admission orders placed  Reviewed Labs: UDS+ cocaine and THC, CMP; glucose 102 elevated, BAL-0 Depression; continue the Prozac 20 mg with Abilify augmentation for mood  stabilization Continue with trazodone 100 PO QHS for insomnia   Per last Md Notes:Perceptual disturbances; pursue the Abilify further Work with CBT/minfulness  Observation Level/Precautions:  15 minute checks  Laboratory:  CBC, CMP, EtoH and UDS reviewed   Psychotherapy:  Individual/group sessions  Medications:  Resume the Prozac and Abilify,   Consultations:    Discharge Concerns:  Safety, stabilization, and risk of access to medication and medication stabilization   Estimated LOS: 5-7 days  Other:     I certify that inpatient services furnished can reasonably be expected to improve the patient's condition.    Tanika N Lewis, NP 3/4/20172:29 PM Case reviewed with NP and patient seen by me Agree with NP  Note/assessment  49 year old male, known to our unit from prior psychiatric admissions. He has a history of depression and substance abuse, with a history of cocaine, alcohol abuse . Most recently had been admitted recently from 2/2 - 10/23/15 for depression. On discharge he was referred to [facility] which he completed successfully . He states " I finished it about a week ago". States he has experiencing auditory hallucinations,   mostly unintelligible , sometimes " I can make words here and there, but they are angry voices ". He has also been experiencing worsening depression, mostly passive thoughts of " being better off dead".  He states he used cocaine and alcohol only one day , on 2/28, but states the hallucinations had been occuring even before his drug use . Of note, UDS positive for cocaine and cannabis, BAL negative  He states he has been compliant with Prozac and Abilify. Dx- Major Depression, Recurrent, with Psychotic Features , Cocaine Abuse, Cannabis Abuse Plan - Inpatient admission, continue Abilify 5 mgrs QDAY, Prozac 20 mgrs QDAY

## 2015-11-16 NOTE — BHH Counselor (Signed)
Adult Comprehensive Assessment  Patient ID: Richard [name], male DOB: 21-Jul-1966, 50 y.o. MRN: [id number]  Information Source: Information source: Patient  Current Stressors:  Educational / Learning stressors: None reported Employment / Job issues: Pt currently on limited disability income Family Relationships: "family relationships are good"  Supportive family, however Pt reports that he does not reach out to them often Financial / Lack of resources (include bankruptcy): Limited income- receives 400 in disabilty each month Housing / Lack of housing: Homeless currently.  Was living in a house in [place] with some roommates who were using Physical health (include injuries & life threatening diseases): Hx of colon cancer- has colostomy bag Social relationships: Limited social support Substance abuse: Relapsed Feb 28 upon discharge from [facility] 10/23/15 and discharge from [facility] 11/06/15 Bereavement / Loss: None reported  Living/Environment/Situation:  Living Arrangements: Other (Comment) Has been living in a house with roommates, but they were using. Living conditions (as described by patient or guardian): uncomfortable  How long has patient lived in current situation?: Less than 1 month What is atmosphere in current home: Chaotic,   Family History:  Marital status: Single Does patient have children?: Yes How many children?: 2 How is patient's relationship with their children?: good relationship with children  Childhood History:  By whom was/is the patient raised?: Both parents Description of patient's relationship with caregiver when they were a child: decent relationship Patient's description of current relationship with people who raised him/her: mother is still living and is supportive How were you disciplined when you got in trouble as a child/adolescent?: unknown Does patient have siblings?: Yes Number of Siblings: 4 Description of patient's current relationship with siblings:  good relationship and they are supportive of patient Did patient suffer any verbal/emotional/physical/sexual abuse as a child?: No Did patient suffer from severe childhood neglect?: No Has patient ever been sexually abused/assaulted/raped as an adolescent or adult?: No Was the patient ever a victim of a crime or a disaster?: No Witnessed domestic violence?: No Has patient been effected by domestic violence as an adult?: No  Education:  Highest grade of school patient has completed: Some college Currently a Ship broker?: No Learning disability?: No  Employment/Work Situation:  Employment situation: On disability Why is patient on disability: medical issues How long has patient been on disability: one year Patient's job has been impacted by current illness: No What is the longest time patient has a held a job?: Unknown Where was the patient employed at that time?: Unknown Has patient ever been in the [employer]?: No Has patient ever served in combat?: No Did You Receive Any Psychiatric Treatment/Services While in Passenger transport manager?: No Are There Guns or Other Weapons in [place]?: No  Financial Resources:  Museum/gallery curator resources: [employer], No income, Medicaid Does patient have a Programmer, applications or guardian?: No  Alcohol/Substance Abuse:  What has been your use of drugs/alcohol within the last 12 months?: Relapsed 11/12/15 after discharge from [facility] 10/23/15 and discharge from [facility] 11/06/15 If attempted suicide, did drugs/alcohol play a role in this?: No Alcohol/Substance Abuse Treatment Hx: Past detox, Past Tx, Inpatient, Past Tx, Outpatient; Recently was at [facility] 2 weeks. Has alcohol/substance abuse ever caused legal problems?: Yes  Social Support System:  Patient's Community Support System: [employer] System: family is supportive, however he does not want to burden them Type of faith/religion: christian How does patient's faith help to cope with current  illness?: Pt did not state  Leisure/Recreation:  Leisure and Hobbies: Unknown  Strengths/Needs:  What things does the patient do well?: working on cars In what areas does patient struggle / problems for patient: managing social issues  Discharge Plan:  Does patient have access to transportation?: No Plan for no access to transportation at discharge: does not know how he could get to [place] Will patient be returning to same living situation after discharge?: No  Plan for living situation after discharge: would like to try to get into an [facility] in [place] or to a long-term treatment program 90 days to a year in [place].  Has family in [place] that can provide transportation to meetings. Currently receiving community mental health services: Yes-pt referred to [facility] during last two admissions If no, would patient like referral for services when discharged?: Yes -possibly [facility] or a treatment program in [place]   Summary/Recommendations:  Summary and Recommendations (to be completed by the evaluator): Patient is a 50yo male admitted with a diagnosis of Substance-Induced Mood Disorder.  Patient presented to the hospital endorsing suicidal ideations with a plan and command hallucinations and reports primary trigger for admission was disgust with how things were not working for him, relapse on 11/12/15, isolation, being around roommates who were using and were not helping him get to meetings, and return of voices.  Patient will benefit from crisis stabilization, medication evaluation, group therapy and psychoeducation, in addition to case management for discharge planning. At discharge it is recommended that Patient adhere to the established discharge plan and continue in treatment.   Selmer Dominion, LCSW 11/16/2015, 2:08 PM

## 2015-11-17 LAB — LIPID PANEL
CHOLESTEROL: 184 mg/dL (ref 0–200)
HDL: 36 mg/dL — ABNORMAL LOW (ref >40)
LDL Cholesterol: 111 mg/dL — ABNORMAL HIGH (ref 0–99)
TRIGLYCERIDES: 187 mg/dL — AB (ref <150)
Total CHOL/HDL Ratio: 5.1 RATIO
VLDL: 37 mg/dL (ref 0–40)

## 2015-11-17 NOTE — BHH Group Notes (Signed)
[place] Group Notes: (Clinical Social Work)   11/17/2015      Type of Therapy:  Group Therapy   Participation Level:  Did Not Attend despite MHT prompting   Selmer Dominion, LCSW 11/17/2015, 12:25 PM

## 2015-11-17 NOTE — Plan of Care (Signed)
Problem: Alteration in mood & ability to function due to Goal: STG-Patient will attend groups Outcome: Progressing Pt did attend AA group tonight

## 2015-11-17 NOTE — Plan of Care (Signed)
Problem: Ineffective individual coping Goal: STG: Patient will remain free from self harm Outcome: Progressing Patient has remained free from self harm and safety maintained with 15 minute checks.

## 2015-11-17 NOTE — Progress Notes (Signed)
D.  Pt pleasant on approach, denies complaints at this time other than headache.  Pt in dayroom watching TV with peers.  Pt was positive for evening AA group, interacting appropriately with peers on unit.  Denies SI/HI/hallucinations at this time.  A.  Support and encouragement offered, medication given as ordered for headache.  R.  Pt remains safe on the unit, will continue to monitor.

## 2015-11-17 NOTE — Progress Notes (Signed)
[facility] MD Progress Note  11/17/2015 1:08 PM Richard [name]  MRN:  [id number] Subjective:  Patient reports " I am still hearing voices."  Objective: Richard [name] is awake, alert and oriented X4 , found resting in bedroom.  Denies suicidal or homicidal ideation. Reports auditory hallucination, denies command voices. Reports he is not able to understand what the voices are saying.  Denies visual hallucination and does not appear to be responding to internal stimuli. Patient interacts well with staff and others. Patient reports he is medication compliant without mediation side effects. Report he like to sleep in order to keep the voices at bey. States his depression 8/10. Reports fair appetite  and resting well throughout the day. Support, encouragement and reassurance was provided.   Principal Problem: MDD (major depressive disorder) ([place]) Diagnosis:   Patient Active Problem List   Diagnosis Date Noted  . MDD (major depressive disorder) ([place]) [F32.9] 11/16/2015  . Severe episode of recurrent major depressive disorder, without psychotic features ([place]) [F33.2]   . MDD (major depressive disorder), recurrent episode, severe ([place]) [F33.2] 10/17/2015  . Cocaine abuse [F14.10] 10/17/2015  . Alcohol abuse [F10.10] 10/17/2015  . Severe single current episode of major depressive disorder, with psychotic features ([place]) [F32.3]   . Substance induced mood disorder ([place]) [F19.94] 10/08/2015  . 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 Colon cancer s/p chemo + radiation and resection. Now with colostomy bag [C18.9] 04/10/2015  . SVT (supraventricular tachycardia) ([place]) [I47.1] 04/10/2015  . Tobacco abuse [Z72.0] 04/10/2015   Total Time spent with patient: 30 minutes  Past Psychiatric History: See Above  Past Medical History:  Past Medical History  Diagnosis Date  . Colon cancer ([place])   . Colon cancer ([place]) 2015  . Colostomy complication ([place]) 62-13-08    blood in colostomy bag    Past Surgical History   Procedure Laterality Date  . Partial colectomy  06/2014    done at [facility]   Family History:  Family History  Problem Relation Age of Onset  . Coronary artery disease Cousin 33   Family Psychiatric  History: See Above Social History:  History  Alcohol Use  . 0.6 oz/week  . 1 Cans of beer per week    Comment: drinks 2 times a week     History  Drug Use  . Yes  . Special: Marijuana, Cocaine    Comment: Lst used 3 days ago    Social History   Social History  . Marital Status: Single    Spouse Name: N/A  . Number of Children: N/A  . Years of Education: N/A   Social History Main Topics  . Smoking status: Current Every Day Smoker -- 0.50 packs/day    Types: Cigarettes  . Smokeless tobacco: Never Used  . Alcohol Use: 0.6 oz/week    1 Cans of beer per week     Comment: drinks 2 times a week  . Drug Use: Yes    Special: Marijuana, Cocaine     Comment: Lst used 3 days ago  . Sexual Activity: No   Other Topics Concern  . None   Social History Narrative   Additional Social History:                         Sleep: Fair  Appetite:  Fair  Current Medications: Current Facility-Administered Medications  Medication Dose Route Frequency Provider Last Rate Last Dose  . acetaminophen (TYLENOL) tablet 650 mg  650 mg Oral Q6H  PRN Jenne Campus, MD   650 mg at 11/16/15 2146  . alum & mag hydroxide-simeth (MAALOX/MYLANTA) 200-200-20 MG/5ML suspension 30 mL  30 mL Oral Q4H PRN Derrill Center, NP      . ARIPiprazole (ABILIFY) tablet 5 mg  5 mg Oral Daily Jenne Campus, MD   5 mg at 11/17/15 6144  . FLUoxetine (PROZAC) capsule 20 mg  20 mg Oral Daily Nicholaus Bloom, MD   20 mg at 11/17/15 3154  . hydrOXYzine (ATARAX/VISTARIL) tablet 25 mg  25 mg Oral Q6H PRN Jenne Campus, MD      . magnesium hydroxide (MILK OF MAGNESIA) suspension 30 mL  30 mL Oral Daily PRN Derrill Center, NP      . traZODone (DESYREL) tablet 100 mg  100 mg Oral QHS Jenne Campus, MD   100  mg at 11/16/15 2146    Lab Results:  Results for orders placed or performed during the hospital encounter of 11/16/15 (from the past 48 hour(s))  Lipid panel     Status: Abnormal   Collection Time: 11/17/15  6:30 AM  Result Value Ref Range   Cholesterol 184 0 - 200 mg/dL   Triglycerides 187 (H) <150 mg/dL   HDL 36 (L) >40 mg/dL   Total CHOL/HDL Ratio 5.1 RATIO   VLDL 37 0 - 40 mg/dL   LDL Cholesterol 111 (H) 0 - 99 mg/dL    Comment:        Total Cholesterol/HDL:CHD Risk Coronary Heart Disease Risk Table                     Men   Women  1/2 Average Risk   3.4   3.3  Average Risk       5.0   4.4  2 X Average Risk   9.6   7.1  3 X Average Risk  23.4   11.0        Use the calculated Patient Ratio above and the CHD Risk Table to determine the patient's CHD Risk.        ATP III CLASSIFICATION (LDL):  <100     mg/dL   Optimal  100-129  mg/dL   Near or Above                    Optimal  130-159  mg/dL   Borderline  160-189  mg/dL   High  >190     mg/dL   Very High Performed at [facility]     Blood Alcohol level:  Lab Results  Component Value Date   Advanced Pain Management <5 11/15/2015   ETH <5 10/07/2015    Physical Findings: AIMS: Facial and Oral Movements Muscles of Facial Expression: None, normal Lips and Perioral Area: None, normal Jaw: None, normal Tongue: None, normal,Extremity Movements Upper (arms, wrists, hands, fingers): None, normal Lower (legs, knees, ankles, toes): None, normal, Trunk Movements Neck, shoulders, hips: None, normal, Overall Severity Severity of abnormal movements (highest score from questions above): None, normal Incapacitation due to abnormal movements: None, normal Patient's awareness of abnormal movements (rate only patient's report): No Awareness, Dental Status Current problems with teeth and/or dentures?: No Does patient usually wear dentures?: No  CIWA:  CIWA-Ar Total: 1 COWS:  COWS Total Score: 1  Musculoskeletal: Strength & Muscle Tone:  within normal limits Gait & Station: normal Patient leans: N/A  Psychiatric Specialty Exam: Review of Systems  Psychiatric/Behavioral: Positive for depression, suicidal ideas and  hallucinations. The patient is nervous/anxious.   All other systems reviewed and are negative.   Blood pressure 99/75, pulse 88, temperature 97.7 F (36.5 C), temperature source Oral, resp. rate 20, height 5' 6.75" (1.695 m), weight 113.853 kg (251 lb).Body mass index is 39.63 kg/(m^2).  General Appearance: Casual paper scrubs  Eye Contact::  Fair  Speech:  Clear and Coherent  Volume:  Decreased  Mood:  Anxious, Depressed and Irritable  Affect:  Depressed and Flat  Thought Process:  Intact  Orientation:  Full (Time, Place, and Person)  Thought Content:  Hallucinations: Auditory denies command hallucination   Suicidal Thoughts:  Yes.  without intent/plan  Homicidal Thoughts:  No  Memory:  Immediate;   Fair Recent;   Fair Remote;   Fair  Judgement:  Intact  Insight:  Fair  Psychomotor Activity:  Restlessness  Concentration:  Fair  Recall:  [employer] of Knowledge:Fair  Language: Good  Akathisia:  No  Handed:  Right  AIMS (if indicated):     Assets:  Desire for Improvement Social Support  ADL's:  Intact  Cognition: WNL  Sleep:  Number of Hours: 6.75    I agree with current treatment plan on 11/17/2015, Patient seen face-to-face for psychiatric evaluation follow-up, chart reviewed. Reviewed the information documented and agree with the treatment plan.  Treatment Plan Summary: Daily contact with patient to assess and evaluate symptoms and progress in treatment and Medication management Supportive approach/coping skills Cocaine abuse-dependence; group session and management of relapse/prevention  Admission orders placed  Reviewed Labs: UDS+ cocaine and THC, CMP; glucose 102 elevated, BAL-0 Depression; continue the Prozac 20 mg with Abilify augmentation for mood stabilization Continue with  trazodone 100 PO QHS for insomnia  Per last Md Notes:Perceptual disturbances; pursue the Abilify further Work with CBT/mindfulness   Derrill Center, NP 11/17/2015, 1:08 PM Agree with NP Progress Note, as above  Neita Garnet, MD

## 2015-11-18 DIAGNOSIS — F333 Major depressive disorder, recurrent, severe with psychotic symptoms: Secondary | ICD-10-CM

## 2015-11-18 LAB — PROLACTIN: PROLACTIN: 9.4 ng/mL (ref 4.0–15.2)

## 2015-11-18 LAB — HEMOGLOBIN A1C
HEMOGLOBIN A1C: 5.9 % — AB (ref 4.8–5.6)
MEAN PLASMA GLUCOSE: 123 mg/dL

## 2015-11-18 MED ORDER — ARIPIPRAZOLE 10 MG PO TABS
10.0000 mg | ORAL_TABLET | Freq: Every day | ORAL | Status: DC
  Administered 2015-11-19: 10 mg via ORAL
  Filled 2015-11-18 (×3): qty 1

## 2015-11-18 MED ORDER — ASPIRIN-ACETAMINOPHEN-CAFFEINE 250-250-65 MG PO TABS
2.0000 | ORAL_TABLET | Freq: Three times a day (TID) | ORAL | Status: DC | PRN
  Administered 2015-11-18 – 2015-11-24 (×8): 2 via ORAL
  Filled 2015-11-18 (×6): qty 2
  Filled 2015-11-18 (×2): qty 1
  Filled 2015-11-18 (×2): qty 2

## 2015-11-18 MED ORDER — CYCLOBENZAPRINE HCL 10 MG PO TABS
5.0000 mg | ORAL_TABLET | Freq: Three times a day (TID) | ORAL | Status: DC | PRN
  Administered 2015-11-18: 5 mg via ORAL
  Filled 2015-11-18 (×2): qty 1

## 2015-11-18 NOTE — BHH Group Notes (Signed)
[facility] LCSW Group Therapy  11/18/2015 1:18 PM  Type of Therapy:  Group Therapy  Participation Level:  Active  Participation Quality:  Drowsy  Affect:  Depressed and Lethargic  Cognitive:  Lacking  Insight:  Limited  Engagement in Therapy:  Improving  Modes of Intervention:  Confrontation, Discussion, Education, Exploration, Problem-solving, Rapport Building, Socialization and Support  Summary of Progress/Problems: Today's Topic: Overcoming Obstacles. Patients identified one short term goal and potential obstacles in reaching this goal. Patients processed barriers involved in overcoming these obstacles. Patients identified steps necessary for overcoming these obstacles and explored motivation (internal and external) for facing these difficulties head on. Richard [name] was lethargic but attempted to participate in group discussion. He shared that he is hoping to get into long term program but is aware of limited options due to medical issues and recent stay at [facility]. Pt was given [facility] list and asked that CSW contact Clearnce Sorrel to discuss his halfway house. Pt was able to problem solve with CSW and other group members and continues to show progress in the group setting.   Smart, Eylin Pontarelli LCSW 11/18/2015, 1:18 PM

## 2015-11-18 NOTE — BHH Group Notes (Signed)
[facility] LCSW Aftercare Discharge Planning Group Note   11/18/2015 11:08 AM  Participation Quality:  Invited. DID NOT ATTEND. Pt chose to remain in bed.   Smart, Rainah Kirshner LCSW

## 2015-11-18 NOTE — Progress Notes (Signed)
Recreation Therapy Notes  Date: 03.06.2017 Time: 9:30am Location: [address] Group Room   Group Topic: Stress Management  Goal Area(s) Addresses:  Patient will actively participate in stress management techniques presented during session.   Behavioral Response: Did not attend.   Laureen Ochs Dayon Witt, LRT/CTRS        Drucilla Cumber L 11/18/2015 2:09 PM

## 2015-11-18 NOTE — BHH Group Notes (Signed)
Pt attended [facility] meeting.  Victorino Sparrow, MHT

## 2015-11-18 NOTE — ED Provider Notes (Signed)
CSN: [id number]     Arrival date & time 11/15/15  [id number] History   First MD Initiated Contact with Patient 11/15/15 [phone]     Chief Complaint  Patient presents with  . Medical Clearance     (Consider location/radiation/quality/duration/timing/severity/associated sxs/prior Treatment) HPI   10yM with increasing depression. He is not sure why. Feeling of worthlessness. Hearing voices. Not new, but normally just hears "mumbling." Now can sometimes make out words. Telling him to harm himself. Has not specific plan. "Just do it." No thoughts or command hallucinations to harm others. Occasionally uses marijuana and cocaine. Denies significant ETOH. Reports compliance with medications.   Past Medical History  Diagnosis Date  . Colon cancer ([place])   . Colon cancer ([place]) 2015  . Colostomy complication ([place]) [id number]    blood in colostomy bag   Past Surgical History  Procedure Laterality Date  . Partial colectomy  06/2014    done at [facility]   Family History  Problem Relation Age of Onset  . Coronary artery disease Cousin 61   Social History  Substance Use Topics  . Smoking status: Current Every Day Smoker -- 0.50 packs/day    Types: Cigarettes  . Smokeless tobacco: Never Used  . Alcohol Use: 0.6 oz/week    1 Cans of beer per week     Comment: drinks 2 times a week    Review of Systems  All systems reviewed and negative, other than as noted in HPI.   Allergies  Morphine and related  Home Medications   Prior to Admission medications   Medication Sig Start Date End Date Taking? Authorizing Provider  ARIPiprazole (ABILIFY) 5 MG tablet Take 1 tablet (5 mg total) by mouth daily. For mood control 10/23/15  Yes Encarnacion Slates, NP  FLUoxetine (PROZAC) 20 MG capsule Take 1 capsule (20 mg total) by mouth daily. For depression 10/23/15  Yes Encarnacion Slates, NP  hydrOXYzine (ATARAX/VISTARIL) 25 MG tablet Take 1 tablet (25 mg total) by mouth every 6 (six) hours as needed for anxiety. 10/23/15  Yes  Encarnacion Slates, NP  traZODone (DESYREL) 100 MG tablet Take 1 tablet (100 mg total) by mouth at bedtime. For sleep 10/23/15  Yes Encarnacion Slates, NP   BP 108/75 mmHg  Pulse 76  Temp(Src) 97.8 F (36.6 C) (Oral)  Resp 18  SpO2 95% Physical Exam  Constitutional: He appears well-developed and well-nourished. No distress.  Sitting in bed. NAD.   HENT:  Head: Normocephalic and atraumatic.  Eyes: Conjunctivae are normal. Right eye exhibits no discharge. Left eye exhibits no discharge.  Neck: Neck supple.  Cardiovascular: Normal rate, regular rhythm and normal heart sounds.  Exam reveals no gallop and no friction rub.   No murmur heard. Pulmonary/Chest: Effort normal and breath sounds normal. No respiratory distress.  Abdominal: Soft. He exhibits no distension. There is no tenderness.  colostomy  Musculoskeletal: He exhibits no edema or tenderness.  Neurological: He is alert.  Skin: Skin is warm and dry.  Psychiatric:  Flat affect. Poor eye contact. Does appear to be actively responding to internal stimuli.   Nursing note and vitals reviewed.   ED Course  Procedures (including critical care time) Labs Review Labs Reviewed  COMPREHENSIVE METABOLIC PANEL - Abnormal; Notable for the following:    Glucose, Bld 102 (*)    All other components within normal limits  CBC WITH DIFFERENTIAL/PLATELET - Abnormal; Notable for the following:    Lymphs Abs 0.6 (*)    All other  components within normal limits  URINE RAPID DRUG SCREEN, HOSP PERFORMED - Abnormal; Notable for the following:    Cocaine POSITIVE (*)    Tetrahydrocannabinol POSITIVE (*)    All other components within normal limits  ETHANOL    Imaging Review No results found. I have personally reviewed and evaluated these images and lab results as part of my medical decision-making.   EKG Interpretation None      MDM   Final diagnoses:  Depression  Suicidal thoughts   49yM with increasing depression. Cannot identify discrete  trigger. Thoughts of harming self. Medically cleared. TTS evaluation.     Virgel Manifold, MD 11/18/15 1304

## 2015-11-18 NOTE — Tx Team (Signed)
Interdisciplinary Treatment Plan Update (Adult)  Date:  11/18/2015  Time Reviewed:  8:29 AM   Progress in Treatment: Attending groups: No. New to unit. Continuing to assess.  Participating in groups:  No. Taking medication as prescribed:  Yes. Tolerating medication:  Yes. Family/Significant othe contact made:  SPE completed with pt, as he declined to consent to family contact.  Patient understands diagnosis:  Yes. and As evidenced by:  seeking treatment for psychosis, SI, depression, alcohol abuse. Discussing patient identified problems/goals with staff:  Yes. Medical problems stabilized or resolved:  Yes. Denies suicidal/homicidal ideation: Yes. Issues/concerns per patient self-inventory:  Other:  Discharge Plan or Barriers: CSW assessing for appropriate referrals. Pt recently admitted to [facility] and had discharged directly to [facility]. CSW assessing.   Reason for Continuation of Hospitalization: Depression Medication stabilization Withdrawal symptoms  Comments:  Richard [name] is an 50 y.o. male. Patient was brought into the ED by EMS because of issues with his colostomy and suicidal ideation with psychosis. Patient continues to endorse suicidal ideations with plan and command hallucinations. Patient reports drinking a gallon liquor daily and last drinking one day ago. Patient was admitted twice in the last 60 days at [facility] for similar symptoms. Diagnosis: Substance Induced Mood Disorder  Estimated length of stay:  3-5 days   New goal(s): to develop effective aftercare plan.   Additional Comments:  Patient and CSW reviewed pt's identified goals and treatment plan. Patient verbalized understanding and agreed to treatment plan. CSW reviewed [facility] "Discharge Process and Patient Involvement" Form. Pt verbalized understanding of information provided and signed form.    Review of initial/current patient goals per problem list:  1. Goal(s): Patient will participate in aftercare plan  Met:  No.   Target date: at discharge  As evidenced by: Patient will participate within aftercare plan AEB aftercare provider and housing plan at discharge being identified.  3/6: CSW assessing for appropriate referrals.   2. Goal (s): Patient will exhibit decreased depressive symptoms and suicidal ideations.  Met: No.    Target date: at discharge  As evidenced by: Patient will utilize self rating of depression at 3 or below and demonstrate decreased signs of depression or be deemed stable for discharge by MD.  3/6: Pt rates depression as high. Denies SI/HI/AVH.   3. Goal(s): Patient will demonstrate decreased signs of withdrawal due to substance abuse  Met:No.   Target date:at discharge   As evidenced by: Patient will produce a CIWA/COWS score of 0, have stable vitals signs, and no symptoms of withdrawal.  3/6: Pt reports mild withdrawals with CIWA of 2 and stable vitals this morning.   Attendees: Patient:   11/18/2015 8:29 AM   Family:   11/18/2015 8:29 AM   Physician:  Dr. Ursula Alert, MD 11/18/2015 8:29 AM   Nursing:   Quintin Alto RN 11/18/2015 8:29 AM   Clinical Social Worker: Maxie Better, LCSW 11/18/2015 8:29 AM   Clinical Social Worker: Erasmo Downer Drinkard LCSW; Peri Maris LCSWA 11/18/2015 8:29 AM   Other:  Gerline Legacy Nurse Case Manager 11/18/2015 8:29 AM   Other:   11/18/2015 8:29 AM   Other:   11/18/2015 8:29 AM   Other:  11/18/2015 8:29 AM   Other:  11/18/2015 8:29 AM   Other:  11/18/2015 8:29 AM    11/18/2015 8:29 AM    11/18/2015 8:29 AM    11/18/2015 8:29 AM    11/18/2015 8:29 AM    Scribe for Treatment Team:   Maxie Better, LCSW 11/18/2015 8:29  AM

## 2015-11-18 NOTE — Progress Notes (Signed)
[facility] MD Progress Note  11/18/2015 2:59 PM Richard [name]  MRN:  [id number]  Subjective:  Patient reports " I am still hearing voices and my headaches start whenever I hear voices."  Objective: Richard [name] is awake, alert and oriented X4 , found resting in bedroom.  Denies suicidal or homicidal ideation. Reports auditory hallucination, denies command voices. Reports he is not able to understand what the voices are saying. Denies visual hallucination and does not appear to be responding to internal stimuli. Patient interacts well with staff and others. Patient reports he is medication compliant without mediation side effects. Report he like to sleep in order to keep the voices at bey. States his depression 8/10. Patient reports I still have a headache that is not reliveved with Tylenol. States that he is having leg pain/spasm on the right side that started when he was fist admitted to this unit. patient denies  falls, injury or illness. Reports fair appetite and resting well throughout the day. Support, encouragement and reassurance was provided.   Principal Problem: MDD (major depressive disorder) ([place]) Diagnosis:   Patient Active Problem List   Diagnosis Date Noted  . Severe episode of recurrent major depressive disorder, with psychotic features ([place]) [F33.3]   . MDD (major depressive disorder) ([place]) [F32.9] 11/16/2015  . Severe episode of recurrent major depressive disorder, without psychotic features ([place]) [F33.2]   . MDD (major depressive disorder), recurrent episode, severe ([place]) [F33.2] 10/17/2015  . Cocaine abuse [F14.10] 10/17/2015  . Alcohol abuse [F10.10] 10/17/2015  . Severe single current episode of major depressive disorder, with psychotic features ([place]) [F32.3]   . Substance induced mood disorder ([place]) [F19.94] 10/08/2015  . 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 Colon cancer s/p chemo + radiation and resection. Now with colostomy bag [C18.9] 04/10/2015  . SVT (supraventricular  tachycardia) ([place]) [I47.1] 04/10/2015  . Tobacco abuse [Z72.0] 04/10/2015   Total Time spent with patient: 30 minutes  Past Psychiatric History: See Above  Past Medical History:  Past Medical History  Diagnosis Date  . Colon cancer ([place])   . Colon cancer ([place]) 2015  . Colostomy complication ([place]) 93-81-82    blood in colostomy bag    Past Surgical History  Procedure Laterality Date  . Partial colectomy  06/2014    done at [facility]   Family History:  Family History  Problem Relation Age of Onset  . Coronary artery disease Cousin 47   Family Psychiatric  History: See Above Social History:  History  Alcohol Use  . 0.6 oz/week  . 1 Cans of beer per week    Comment: drinks 2 times a week     History  Drug Use  . Yes  . Special: Marijuana, Cocaine    Comment: Lst used 3 days ago    Social History   Social History  . Marital Status: Single    Spouse Name: N/A  . Number of Children: N/A  . Years of Education: N/A   Social History Main Topics  . Smoking status: Current Every Day Smoker -- 0.50 packs/day    Types: Cigarettes  . Smokeless tobacco: Never Used  . Alcohol Use: 0.6 oz/week    1 Cans of beer per week     Comment: drinks 2 times a week  . Drug Use: Yes    Special: Marijuana, Cocaine     Comment: Lst used 3 days ago  . Sexual Activity: No   Other Topics Concern  . None   Social History Narrative  Additional Social History:                         Sleep: Fair  Appetite:  Fair  Current Medications: Current Facility-Administered Medications  Medication Dose Route Frequency Provider Last Rate Last Dose  . acetaminophen (TYLENOL) tablet 650 mg  650 mg Oral Q6H PRN Jenne Campus, MD   650 mg at 11/18/15 0748  . alum & mag hydroxide-simeth (MAALOX/MYLANTA) 200-200-20 MG/5ML suspension 30 mL  30 mL Oral Q4H PRN Derrill Center, NP      . ARIPiprazole (ABILIFY) tablet 5 mg  5 mg Oral Daily Jenne Campus, MD   5 mg at 11/18/15 0746  .  aspirin-acetaminophen-caffeine (EXCEDRIN MIGRAINE) per tablet 2 tablet  2 tablet Oral Q8H PRN Derrill Center, NP   2 tablet at 11/18/15 1206  . cyclobenzaprine (FLEXERIL) tablet 5 mg  5 mg Oral TID PRN Nicholaus Bloom, MD   5 mg at 11/18/15 1300  . FLUoxetine (PROZAC) capsule 20 mg  20 mg Oral Daily Nicholaus Bloom, MD   20 mg at 11/18/15 0746  . hydrOXYzine (ATARAX/VISTARIL) tablet 25 mg  25 mg Oral Q6H PRN Jenne Campus, MD   25 mg at 11/18/15 0748  . magnesium hydroxide (MILK OF MAGNESIA) suspension 30 mL  30 mL Oral Daily PRN Derrill Center, NP      . traZODone (DESYREL) tablet 100 mg  100 mg Oral QHS Jenne Campus, MD   100 mg at 11/17/15 2235    Lab Results:  Results for orders placed or performed during the hospital encounter of 11/16/15 (from the past 48 hour(s))  Hemoglobin A1c     Status: Abnormal   Collection Time: 11/17/15  6:30 AM  Result Value Ref Range   Hgb A1c MFr Bld 5.9 (H) 4.8 - 5.6 %    Comment: (NOTE)         Pre-diabetes: 5.7 - 6.4         Diabetes: >6.4         Glycemic control for adults with diabetes: <7.0    Mean Plasma Glucose 123 mg/dL    Comment: (NOTE) Performed At: [facility] [place], [place] [id number] Lindon Romp MD [id number] Performed at [facility]   Lipid panel     Status: Abnormal   Collection Time: 11/17/15  6:30 AM  Result Value Ref Range   Cholesterol 184 0 - 200 mg/dL   Triglycerides 187 (H) <150 mg/dL   HDL 36 (L) >40 mg/dL   Total CHOL/HDL Ratio 5.1 RATIO   VLDL 37 0 - 40 mg/dL   LDL Cholesterol 111 (H) 0 - 99 mg/dL    Comment:        Total Cholesterol/HDL:CHD Risk Coronary Heart Disease Risk Table                     Men   Women  1/2 Average Risk   3.4   3.3  Average Risk       5.0   4.4  2 X Average Risk   9.6   7.1  3 X Average Risk  23.4   11.0        Use the calculated Patient Ratio above and the CHD Risk Table to determine the patient's CHD Risk.        ATP III  CLASSIFICATION (LDL):  <100  mg/dL   Optimal  100-129  mg/dL   Near or Above                    Optimal  130-159  mg/dL   Borderline  160-189  mg/dL   High  >190     mg/dL   Very High Performed at [facility]   Prolactin     Status: None   Collection Time: 11/17/15  6:30 AM  Result Value Ref Range   Prolactin 9.4 4.0 - 15.2 ng/mL    Comment: (NOTE) Performed At: [facility] [place], [place] [id number] Lindon Romp MD OX:[id number] Performed at [facility]     Blood Alcohol level:  Lab Results  Component Value Date   [facility] <5 11/15/2015   ETH <5 10/07/2015    Physical Findings: AIMS: Facial and Oral Movements Muscles of Facial Expression: None, normal Lips and Perioral Area: None, normal Jaw: None, normal Tongue: None, normal,Extremity Movements Upper (arms, wrists, hands, fingers): None, normal Lower (legs, knees, ankles, toes): None, normal, Trunk Movements Neck, shoulders, hips: None, normal, Overall Severity Severity of abnormal movements (highest score from questions above): None, normal Incapacitation due to abnormal movements: None, normal Patient's awareness of abnormal movements (rate only patient's report): No Awareness, Dental Status Current problems with teeth and/or dentures?: No Does patient usually wear dentures?: No  CIWA:  CIWA-Ar Total: 1 COWS:  COWS Total Score: 2  Musculoskeletal: Strength & Muscle Tone: within normal limits Gait & Station: normal Patient leans: N/A  Psychiatric Specialty Exam: Review of Systems  Musculoskeletal:       Right side leg pain  Neurological: Positive for headaches.  Psychiatric/Behavioral: Positive for depression, suicidal ideas and hallucinations. The patient is nervous/anxious.   All other systems reviewed and are negative.   Blood pressure 105/78, pulse 83, temperature 97.9 F (36.6 C), temperature source Oral, resp. rate 20, height 5' 6.75" (1.695 m),  weight 113.853 kg (251 lb).Body mass index is 39.63 kg/(m^2).  General Appearance: Casual paper scrubs  Eye Contact::  Fair  Speech:  Clear and Coherent  Volume:  Decreased  Mood:  Anxious, Depressed and Irritable  Affect:  Depressed and Flat  Thought Process:  Intact  Orientation:  Full (Time, Place, and Person)  Thought Content:  Hallucinations: Auditory denies command hallucination   Suicidal Thoughts:  Yes.  without intent/plan  Homicidal Thoughts:  No  Memory:  Immediate;   Fair Recent;   Fair Remote;   Fair  Judgement:  Intact  Insight:  Fair  Psychomotor Activity:  Restlessness  Concentration:  Fair  Recall:  [employer] of Knowledge:Fair  Language: Good  Akathisia:  No  Handed:  Right  AIMS (if indicated):     Assets:  Desire for Improvement Social Support  ADL's:  Intact  Cognition: WNL  Sleep:  Number of Hours: 6   I agree with current treatment plan on 11/18/2015, Patient seen face-to-face for psychiatric evaluation follow-up, chart reviewed and dicussed with MD.Eappen. . Reviewed the information documented and agree with the treatment plan.  Treatment Plan Summary: Daily contact with patient to assess and evaluate symptoms and progress in treatment and Medication management Supportive approach/coping skills Cocaine abuse-dependence; group session and management of relapse/prevention  Admission orders placed  Reviewed Labs: UDS+ cocaine and THC, CMP; glucose 102 elevated, BAL-0 Depression; continue the Prozac 20 mg with Abilify augmentation for mood stabilization, Increase Abilify to 10 mg  PO daily Continue  with trazodone 100 PO QHS for insomnia  Start flexeril 5 mg for leg pain/spasm  Start Excedrin 2 tables for headaches Per last Md Notes:Perceptual disturbances; pursue the Abilify further Work with CBT/mindfulness   Derrill Center, NP 11/18/2015, 2:59 PM

## 2015-11-18 NOTE — Progress Notes (Signed)
Patient did attend the evening speaker AA meeting.  

## 2015-11-18 NOTE — Plan of Care (Signed)
Problem: Consults Goal: Depression Patient Education See Patient Education Module for education specifics.  Outcome: Not Progressing Nurse discussed depression/coping skills with patient.        

## 2015-11-18 NOTE — Progress Notes (Signed)
D:  Patient's self inventory sheet, patient sleeps good, sleep medication is helpful.  Fair appetite, low energy level, poor concentration.  Rated depression, hopeless and anxiety #8.  Denied withdrawals.  Denied SI.  Pain, back, medication is helpful.  Patient stated he had colon cancer surgery 1.5 yrs ago, R lower pain at this time.  No discharge plans. A:  Medications administered per MD orders.  Emotional support and encouragement given patient. R:  Denied SI and HI at this time, contracts for safety.  Denied visual hallucinations at this time.  Does hear voices/mumblings this morning.  Safety maintained with 15 minute checks.

## 2015-11-19 MED ORDER — ARIPIPRAZOLE 15 MG PO TABS
15.0000 mg | ORAL_TABLET | Freq: Every day | ORAL | Status: DC
  Administered 2015-11-20: 15 mg via ORAL
  Filled 2015-11-19 (×2): qty 1

## 2015-11-19 NOTE — B[facility] Group Notes (Signed)
The focus of this group is to educate the patient on the purpose and policies of crisis stabilization and provide a format to answer questions about their admission.  The group details unit policies and expectations of patients while admitted.  Patient did not attend 0900 nurse education orientation group this morning.  Patient stayed in bed.   

## 2015-11-19 NOTE — Progress Notes (Signed)
Pt did not attend AA meeting this evening. Pt stayed in bed asleep.

## 2015-11-19 NOTE — Progress Notes (Signed)
Recreation Therapy Notes  Animal-Assisted Activity (AAA) Program Checklist/Progress Notes Patient Eligibility Criteria Checklist & Daily Group note for Rec Tx Intervention  Date: 03.07.2017 Time: 2:45pm Location: [address]   AAA/T Program Assumption of Risk Form signed by Patient/ or Parent Legal Guardian yes  Patient is free of allergies or sever asthma yes  Patient reports no fear of animals yes  Patient reports no history of cruelty to animals yes  Patient understands his/her participation is voluntary yes  Behavioral Response: Did not attend.    Laureen Ochs Owens Hara, LRT/CTRS  Lane Hacker 11/19/2015 2:58 PM

## 2015-11-19 NOTE — Progress Notes (Signed)
D:  Patient's self inventory sheet, patient has poor sleep, no sleep medication given.  Fair appetite, low energy level, poor concentration.  Rated depression, hopeless and anxiety #8.  Withdrawals, diarrhea.  Suicidal thoughts, contracts for safety.  Has experienced headaches in past 24 hours.  Worst pain #8.  Pain medication is helpful.  Goal is to get well.  Plans to do whatever needs to be done.  No discharge plans. A:  Medications administered per MD orders.  Emotional support and encouragement given patient. R:  Denied SI and HI while talking to nurse this morning.  Contracts for safety.  Denied A/V hallucinations.  Safety maintained with 15 minute checks.

## 2015-11-19 NOTE — BHH Group Notes (Signed)
[place] LCSW Group Therapy  11/19/2015 1:30 PM  Type of Therapy:  Group Therapy  Participation Level:  attentive  Participation Quality:  Attentive  Affect:  Appropriate  Cognitive:  Alert and Oriented  Insight:  Improving  Engagement in Therapy:  Improving  Modes of Intervention:  Discussion, Education, Exploration, Problem-solving, Rapport Building, Socialization and Support  Summary of Progress/Problems: [facility] Speaker came to talk about his personal journey with substance abuse and addiction. The pt processed ways by which to relate to the speaker. [place] speaker provided handouts and educational information pertaining to groups and services offered by the [facility].   Smart, Janesa Dockery LCSW 11/19/2015, 1:30 PM

## 2015-11-19 NOTE — Progress Notes (Signed)
Patient received calm and visible on the unit. Patient verbalized no desire for medications, and after AA meeting, patient went to bed. Patient refused night trazodone as he was already asleep. Patient denied SI/ HI, A/ V H, endorsed some depression and pain. Milieu quiet to promote rest, respirations even and unlabored.  0630: Patient slept all night without trouble.

## 2015-11-19 NOTE — Progress Notes (Signed)
Patient ID: Richard [name], male   DOB: [birth year], 50 y.o.   MRN: [id number] [facility] MD Progress Note  11/19/2015 3:41 PM Richard [name]  MRN:  [id number]  Subjective:  Tammy reports reports "I just finished the group meetings. The voice started. When it does, I will have a headache. That is why I'm lying down in bed. Other than that, I feel okay"  Objective: Richard [name] is awake, alert and oriented X4, found resting in bedroom.  Denies suicidal or homicidal ideation. Reports auditory hallucination, denies command voices. Reports he is not able to understand what the voices are saying. Denies visual hallucination and does not appear to be responding to internal stimuli. Patient interacts well with staff and others. Patient reports he is medication compliant without mediation side effects. Reports he like to sleep in order to keep the voices at bey. States his depression 8/10. Patient reports I still have a headache that is not reliveved with Tylenol. States that he is having leg pain/spasm on the right side that started when he was fist admitted to this unit. Patient denies  falls, injury or illness. Reports fair appetite and resting well throughout the day. Support, encouragement and reassurance was provided.   Principal Problem: MDD (major depressive disorder) ([place]) Diagnosis:   Patient Active Problem List   Diagnosis Date Noted  . Severe episode of recurrent major depressive disorder, with psychotic features ([place]) [F33.3]   . MDD (major depressive disorder) ([place]) [F32.9] 11/16/2015  . Severe episode of recurrent major depressive disorder, without psychotic features ([place]) [F33.2]   . MDD (major depressive disorder), recurrent episode, severe ([place]) [F33.2] 10/17/2015  . Cocaine abuse [F14.10] 10/17/2015  . Alcohol abuse [F10.10] 10/17/2015  . Severe single current episode of major depressive disorder, with psychotic features ([place]) [F32.3]   . Substance induced mood disorder ([place]) [F19.94] 10/08/2015  .  Colostomy complication ([place]) [N82.95]   . Colon cancer s/p chemo + radiation and resection. Now with colostomy bag [C18.9] 04/10/2015  . SVT (supraventricular tachycardia) ([place]) [I47.1] 04/10/2015  . Tobacco abuse [Z72.0] 04/10/2015   Total Time spent with patient: 15 minutes  Past Psychiatric History: See Above  Past Medical History:  Past Medical History  Diagnosis Date  . Colon cancer ([place])   . Colon cancer ([place]) 2015  . Colostomy complication ([place]) 62-13-08    blood in colostomy bag    Past Surgical History  Procedure Laterality Date  . Partial colectomy  06/2014    done at [facility]   Family History:  Family History  Problem Relation Age of Onset  . Coronary artery disease Cousin 92   Family Psychiatric  History: See Above  Social History:  History  Alcohol Use  . 0.6 oz/week  . 1 Cans of beer per week    Comment: drinks 2 times a week     History  Drug Use  . Yes  . Special: Marijuana, Cocaine    Comment: Lst used 3 days ago    Social History   Social History  . Marital Status: Single    Spouse Name: N/A  . Number of Children: N/A  . Years of Education: N/A   Social History Main Topics  . Smoking status: Current Every Day Smoker -- 0.50 packs/day    Types: Cigarettes  . Smokeless tobacco: Never Used  . Alcohol Use: 0.6 oz/week    1 Cans of beer per week     Comment: drinks 2 times a week  . Drug Use: Yes  Special: Marijuana, Cocaine     Comment: Lst used 3 days ago  . Sexual Activity: No   Other Topics Concern  . None   Social History Narrative   Additional Social History:   Sleep: Good  Appetite:  Fair  Current Medications: Current Facility-Administered Medications  Medication Dose Route Frequency Provider Last Rate Last Dose  . acetaminophen (TYLENOL) tablet 650 mg  650 mg Oral Q6H PRN Jenne Campus, MD   650 mg at 11/19/15 0737  . alum & mag hydroxide-simeth (MAALOX/MYLANTA) 200-200-20 MG/5ML suspension 30 mL  30 mL Oral Q4H PRN  Derrill Center, NP      . ARIPiprazole (ABILIFY) tablet 10 mg  10 mg Oral Daily Derrill Center, NP   10 mg at 11/19/15 0736  . aspirin-acetaminophen-caffeine (EXCEDRIN MIGRAINE) per tablet 2 tablet  2 tablet Oral Q8H PRN Derrill Center, NP   2 tablet at 11/18/15 1206  . cyclobenzaprine (FLEXERIL) tablet 5 mg  5 mg Oral TID PRN Nicholaus Bloom, MD   5 mg at 11/18/15 1300  . FLUoxetine (PROZAC) capsule 20 mg  20 mg Oral Daily Nicholaus Bloom, MD   20 mg at 11/19/15 0736  . hydrOXYzine (ATARAX/VISTARIL) tablet 25 mg  25 mg Oral Q6H PRN Jenne Campus, MD   25 mg at 11/18/15 0748  . magnesium hydroxide (MILK OF MAGNESIA) suspension 30 mL  30 mL Oral Daily PRN Derrill Center, NP      . traZODone (DESYREL) tablet 100 mg  100 mg Oral QHS Jenne Campus, MD   100 mg at 11/17/15 2235   Lab Results:  No results found for this or any previous visit (from the past 47 hour(s)).  Blood Alcohol level:  Lab Results  Component Value Date   ETH <5 11/15/2015   ETH <5 10/07/2015   Physical Findings: AIMS: Facial and Oral Movements Muscles of Facial Expression: None, normal Lips and Perioral Area: None, normal Jaw: None, normal Tongue: None, normal,Extremity Movements Upper (arms, wrists, hands, fingers): None, normal Lower (legs, knees, ankles, toes): None, normal, Trunk Movements Neck, shoulders, hips: None, normal, Overall Severity Severity of abnormal movements (highest score from questions above): None, normal Incapacitation due to abnormal movements: None, normal Patient's awareness of abnormal movements (rate only patient's report): No Awareness, Dental Status Current problems with teeth and/or dentures?: No Does patient usually wear dentures?: No  CIWA:  CIWA-Ar Total: 1 COWS:  COWS Total Score: 1  Musculoskeletal: Strength & Muscle Tone: within normal limits Gait & Station: normal Patient leans: N/A  Psychiatric Specialty Exam: Review of Systems  Musculoskeletal:       Right side leg  pain  Neurological: Positive for headaches.  Psychiatric/Behavioral: Positive for depression, suicidal ideas and hallucinations. The patient is nervous/anxious.   All other systems reviewed and are negative.   Blood pressure 106/79, pulse 76, temperature 98.3 F (36.8 C), temperature source Oral, resp. rate 20, height 5' 6.75" (1.695 m), weight 113.853 kg (251 lb).Body mass index is 39.63 kg/(m^2).  General Appearance: Casual paper scrubs  Eye Contact::  Fair  Speech:  Clear and Coherent  Volume:  Decreased  Mood:  Anxious, Depressed and Irritable  Affect:  Depressed and Flat  Thought Process:  Intact  Orientation:  Full (Time, Place, and Person)  Thought Content:  Hallucinations: Auditory denies command hallucination   Suicidal Thoughts:  Yes.  without intent/plan  Homicidal Thoughts:  No  Memory:  Immediate;   Fair Recent;  Fair Remote;   Fair  Judgement:  Intact  Insight:  Fair  Psychomotor Activity:  Restlessness  Concentration:  Fair  Recall:  [employer] of Knowledge:Fair  Language: Good  Akathisia:  No  Handed:  Right  AIMS (if indicated):     Assets:  Desire for Improvement Social Support  ADL's:  Intact  Cognition: WNL  Sleep:  Number of Hours: 6.75   Treatment Plan Summary: Daily contact with patient to assess and evaluate symptoms and progress in treatment and Medication management Supportive approach/coping skills Cocaine abuse-dependence; group session and management of relapse/prevention  Admission orders placed  Reviewed Labs: UDS+ cocaine and THC, CMP; glucose 102 elevated, BAL-0 Depression; continue the Prozac 20 mg with Abilify augmentation for mood stabilization,  Abilify to 10 mg  PO daily Continue with trazodone 100 mg PO QHS for insomnia  Continue lexeril 5 mg for leg pain/spasm  Continue Excedrin 2 tables for headaches Per last Md Notes:Perceptual disturbances; pursue the Abilify further Work with CBT/mindfulness  Encarnacion Slates, NP,  PMHNP 11/19/2015, 3:41 PM

## 2015-11-19 NOTE — Progress Notes (Signed)
D:Patient in his room on approach.  Patient changed colostomy bag at the beginning of the shift.  Patient states he remains depressed.  Patient states he was having a good day but states states he started hearing voices again.  Patient states the voices are mumbling.  Patient states he is passive SI.  Patient denies HI.  Patient states his plan is to go to an [facility] when discharge. A: Staff to monitor Q 15 mins for safety.  Encouragement and support offered.  Scheduled medications administered per orders. R: Patient remains safe on the unit.  Patient attended group tonight.  Patient visible unit for group.  Patient taking administered medications.

## 2015-11-19 NOTE — Progress Notes (Signed)
Pt given [employer] [facility] [employer]. CSW contacted Clearnce Sorrel Civil engineer, [employer] of [employer], technical sales) and requested that he visit with pt today to discuss program, as there are immediate openings. Pt made aware that Darden Dates will see him prior to lunch today. Pt is not eligible for [facility] due to recent admission. Pt is not eligible for [facility] due to [facility] address and [employer]. Waitlist at [employer] and [employer]. Waitlist at [facility]. Pt reports that if he decides to go with an [facility] or [facility], he will be able to stay with his son for a few weeks until his disability check comes in at the beginning of April. CSW continuing to assess for appropriate referrals.   Maxie Better, MSW, LCSW Clinical Social Worker 11/19/2015 11:31 AM

## 2015-11-19 NOTE — Plan of Care (Signed)
Problem: Consults Goal: Depression Patient Education See Patient Education Module for education specifics.  Outcome: Progressing Nurse discussed depression/coping skills with patient.        

## 2015-11-20 MED ORDER — ARIPIPRAZOLE 10 MG PO TABS
20.0000 mg | ORAL_TABLET | Freq: Every day | ORAL | Status: DC
  Administered 2015-11-21 – 2015-11-26 (×6): 20 mg via ORAL
  Filled 2015-11-20 (×8): qty 2

## 2015-11-20 NOTE — Progress Notes (Signed)
Adult Psychoeducational Group Note  Date:  11/20/2015 Time:  11:44 PM  Group Topic/Focus:  Wrap-Up Group:   The focus of this group is to help patients review their daily goal of treatment and discuss progress on daily workbooks.  Participation Level:  Did Not Attend  Participation Quality:  Drowsy  Affect:  Flat  Cognitive:  Lacking  Insight: None  Engagement in Group:  None and Supportive  Modes of Intervention:  Support  Additional Comments:  Pt was not in group he decided to stay in his room   Charmika Macdonnell R 11/20/2015, 11:44 PM

## 2015-11-20 NOTE — [facility] Group Notes (Signed)
[place] LCSW Group Therapy  11/20/2015 1:16 PM  Type of Therapy:  Group Therapy  Participation Level:  Active  Participation Quality:  Attentive  Affect:  Appropriate  Cognitive:  Alert and Oriented  Insight:  Improving  Engagement in Therapy:  Improving  Modes of Intervention:  Confrontation, Discussion, Education, Exploration, Problem-solving, Rapport Building, Socialization and Support  Summary of Progress/Problems: Emotion Regulation: This group focused on both positive and negative emotion identification and allowed group members to process ways to identify feelings, regulate negative emotions, and find healthy ways to manage internal/external emotions. Group members were asked to reflect on a time when their reaction to an emotion led to a negative outcome and explored how alternative responses using emotion regulation would have benefited them. Group members were also asked to discuss a time when emotion regulation was utilized when a negative emotion was experienced. Antwan shared that he struggles with anxiety and guilt over the past. "when I look back at all I lost, I can't believe it. I'll never get that back and that eats me up." Brock talked about his life now compared to 20 years ago. He shared that he is motivated to start living for now and redefining "what a good life looks like."   Smart, Calani Gick LCSW 11/20/2015, 1:16 PM

## 2015-11-20 NOTE — Progress Notes (Signed)
Patient ID: Richard [name], male   DOB: April 28, 1966, 50 y.o.   MRN: [id number] [facility] MD Progress Note  11/20/2015 3:55 PM Richard [name]  MRN:  [id number]  Subjective:  Reports ongoing depression, and states he is feeling discouraged and upset due to persistence of auditory hallucinations, which he describes as unintelligible distant voices/ chatter. He states that " when I hear the voices I often end up getting a headache". Denies medication side effects.  Objective: Case discussed with treatment team and patient seen . Presents depressed, dysphoric, sad. Denies suicidal ideations, and contracts for safety on the unit. As noted, reports auditory hallucinations, which are intermittent. At this time not internally preoccupied, no thought disorder, no delusions expressed . Thus far tolerating Abilify trial well - denies medication side effects Behavior on unit in good control . No disruptive or agitated behaviors .  Principal Problem: MDD (major depressive disorder) ([place]) Diagnosis:   Patient Active Problem List   Diagnosis Date Noted  . Severe episode of recurrent major depressive disorder, with psychotic features ([place]) [F33.3]   . MDD (major depressive disorder) ([place]) [F32.9] 11/16/2015  . Severe episode of recurrent major depressive disorder, without psychotic features ([place]) [F33.2]   . MDD (major depressive disorder), recurrent episode, severe ([place]) [F33.2] 10/17/2015  . Cocaine abuse [F14.10] 10/17/2015  . Alcohol abuse [F10.10] 10/17/2015  . Severe single current episode of major depressive disorder, with psychotic features ([place]) [F32.3]   . Substance induced mood disorder ([place]) [F19.94] 10/08/2015  . 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 Colon cancer s/p chemo + radiation and resection. Now with colostomy bag [C18.9] 04/10/2015  . SVT (supraventricular tachycardia) ([place]) [I47.1] 04/10/2015  . Tobacco abuse [Z72.0] 04/10/2015   Total Time spent with patient:  20 minutes   Past Psychiatric  History: See Above  Past Medical History:  Past Medical History  Diagnosis Date  . Colon cancer ([place])   . Colon cancer ([place]) 2015  . Colostomy complication ([place]) 62-13-08    blood in colostomy bag    Past Surgical History  Procedure Laterality Date  . Partial colectomy  06/2014    done at [facility]   Family History:  Family History  Problem Relation Age of Onset  . Coronary artery disease Cousin 21   Family Psychiatric  History: See Above  Social History:  History  Alcohol Use  . 0.6 oz/week  . 1 Cans of beer per week    Comment: drinks 2 times a week     History  Drug Use  . Yes  . Special: Marijuana, Cocaine    Comment: Lst used 3 days ago    Social History   Social History  . Marital Status: Single    Spouse Name: N/A  . Number of Children: N/A  . Years of Education: N/A   Social History Main Topics  . Smoking status: Current Every Day Smoker -- 0.50 packs/day    Types: Cigarettes  . Smokeless tobacco: Never Used  . Alcohol Use: 0.6 oz/week    1 Cans of beer per week     Comment: drinks 2 times a week  . Drug Use: Yes    Special: Marijuana, Cocaine     Comment: Lst used 3 days ago  . Sexual Activity: No   Other Topics Concern  . None   Social History Narrative   Additional Social History:   Sleep: Good  Appetite:  Fair  Current Medications: Current Facility-Administered Medications  Medication Dose Route Frequency Provider Last  Rate Last Dose  . acetaminophen (TYLENOL) tablet 650 mg  650 mg Oral Q6H PRN Jenne Campus, MD   650 mg at 11/19/15 0737  . alum & mag hydroxide-simeth (MAALOX/MYLANTA) 200-200-20 MG/5ML suspension 30 mL  30 mL Oral Q4H PRN Derrill Center, NP      . Derrill Memo ON 11/21/2015] ARIPiprazole (ABILIFY) tablet 20 mg  20 mg Oral Daily Jenne Campus, MD      . aspirin-acetaminophen-caffeine (EXCEDRIN MIGRAINE) per tablet 2 tablet  2 tablet Oral Q8H PRN Derrill Center, NP   2 tablet at 11/19/15 2202  . cyclobenzaprine (FLEXERIL)  tablet 5 mg  5 mg Oral TID PRN Nicholaus Bloom, MD   5 mg at 11/18/15 1300  . FLUoxetine (PROZAC) capsule 20 mg  20 mg Oral Daily Nicholaus Bloom, MD   20 mg at 11/20/15 0824  . hydrOXYzine (ATARAX/VISTARIL) tablet 25 mg  25 mg Oral Q6H PRN Jenne Campus, MD   25 mg at 11/18/15 0748  . magnesium hydroxide (MILK OF MAGNESIA) suspension 30 mL  30 mL Oral Daily PRN Derrill Center, NP      . traZODone (DESYREL) tablet 100 mg  100 mg Oral QHS Jenne Campus, MD   100 mg at 11/19/15 2202   Lab Results:  No results found for this or any previous visit (from the past 36 hour(s)).  Blood Alcohol level:  Lab Results  Component Value Date   ETH <5 11/15/2015   ETH <5 10/07/2015   Physical Findings: AIMS: Facial and Oral Movements Muscles of Facial Expression: None, normal Lips and Perioral Area: None, normal Jaw: None, normal Tongue: None, normal,Extremity Movements Upper (arms, wrists, hands, fingers): None, normal Lower (legs, knees, ankles, toes): None, normal, Trunk Movements Neck, shoulders, hips: None, normal, Overall Severity Severity of abnormal movements (highest score from questions above): None, normal Incapacitation due to abnormal movements: None, normal Patient's awareness of abnormal movements (rate only patient's report): No Awareness, Dental Status Current problems with teeth and/or dentures?: No Does patient usually wear dentures?: No  CIWA:  CIWA-Ar Total: 1 COWS:  COWS Total Score: 1  Musculoskeletal: Strength & Muscle Tone: within normal limits Gait & Station: normal Patient leans: N/A  Psychiatric Specialty Exam: Review of Systems  Musculoskeletal:       Right side leg pain  Neurological: Positive for headaches.  Psychiatric/Behavioral: Positive for depression, suicidal ideas and hallucinations. The patient is nervous/anxious.   All other systems reviewed and are negative. endorses frequent headaches   Blood pressure 107/71, pulse 83, temperature 98.6 F (37  C), temperature source Oral, resp. rate 18, height 5' 6.75" (1.695 m), weight 251 lb (113.853 kg).Body mass index is 39.63 kg/(m^2).  General Appearance: Casual   Eye Contact::  Good   Speech:  Clear and Coherent  Volume:  Decreased  Mood:  Depressed   Affect:  Constricted   Thought Process:  Intact- linear   Orientation:  Full (Time, Place, and Person)  Thought Content:  Hallucinations: Auditory describes as distant chatter, cannot make out what they say   Suicidal Thoughts:  Denies plan or intention of suicide and contracts for safety on the unit .   Homicidal Thoughts:  No  Memory:  Recent and remote grossly intact   Judgement:  Intact  Insight:  Fair  Psychomotor Activity:  Decreased   Concentration:  Good  Recall:  Good  Fund of Knowledge:Good  Language: Good  Akathisia:  No  Handed:  Right  AIMS (if indicated):     Assets:  Desire for Improvement Social Support  ADL's:  Intact  Cognition: WNL  Sleep:  Number of Hours: 6.75  Assessment- patient reports auditory hallucinations, as above . He states that these contribute to his feeling depressed, discouraged. At this time not internally preoccupied and no other symptoms of psychosis noted or reported ( no delusions, no thought disorder, does not present paranoid )  Tolerating medications well thus far . Treatment Plan Summary: Continue to encourage group and milieu participation to work on coping skills and symptom reduction. Continue  Prozac 20 mg QDAY for depression  Increase Abilify to 20  mg  QDAY for psychotic symptoms Continue  trazodone 100 mg PO QHS for insomnia  Continue Hydroxyzine  25 mgrs Q 6 h PRN for anxiety as needed    Neita Garnet, MD 11/20/2015, 3:55 PM

## 2015-11-20 NOTE — Progress Notes (Signed)
DAR NOTE: Pt present with flat affect and depressed mood in the unit. Pt has been in the dayroom but not interacting much with peers. Pt denies physical pain, took all his meds as scheduled. As per self inventory, pt had a fair night sleep, fair appetite, low energy, and poor concentration. Pt rate depression at 8, hopeless ness at 8, and anxiety at 8.Pt's goal for today is " get rid of voices and headaches." Pt's safety ensured with 15 minute and environmental checks. Pt currently denies SI/HI and A/V hallucinations. Pt verbally agrees to seek staff if SI/HI or A/VH occurs and to consult with staff before acting on these thoughts. Will continue POC.

## 2015-11-20 NOTE — [facility] Group Notes (Signed)
[facility] LCSW Aftercare Discharge Planning Group Note   11/20/2015 10:38 AM  Participation Quality:  Minimal   Mood/Affect:  Depressed, Flat and Lethargic  Depression Rating:  8  Anxiety Rating:  9  Thoughts of Suicide:  No Will you contract for safety?   NA  Current AVH:  Yes "The voices are there all the time and giving me a headache. I need to talk to a doctor today to get my meds changed or something." Dr Parke Poisson has been notified.   Plan for Discharge/Comments:  Pt reports poor sleep and headache from voices in his head. Pt met with Clearnce Sorrel yesterday regarding halfway house. Pt also given [facility] list and [facility] information. He was encouraged to call them today. Pt is not eligible for [facility] due to recent admission. Medicaid is in [place] so pt is not eligible for [facility] but was provided with [employer] number to start process of getting medicaid transferred to [employer] (per his request).  Pt reports poor sleep.   Transportation Means: unknown at this time.   Supports: adult son is limited Architectural technologist, Photographer

## 2015-11-20 NOTE — Progress Notes (Signed)
D:Patient in the hallway on approach.  Patient states he had a good day.  Patient changed his colostomy bag tonight.  Patient denies SI/HI and states he hears mumbles.  Patient  Patient verbally contracts for safety.   Patient affect brightened when speaking to Probation officer. A: Staff to monitor Q 15 mins for safety.  Encouragement and support offered.  Scheduled medications administered per orders.  Excedrin migraine administered prn for a headache. R: Patient remains safe on the unit.  Patient attended group tonight.  Patient visible on the unit.  Patient taking administered medications.

## 2015-11-20 NOTE — Progress Notes (Signed)
Recreation Therapy Notes  Date: 03.08.2017 Time: 9:30am Location: 300 Hall Group Room  Group Topic: Stress Management  Goal Area(s) Addresses:  Patient will actively participate in stress management techniques presented during session.   Behavioral Response: Did not attend.   Laureen Ochs Jacere Pangborn, LRT/CTRS         Vi Biddinger L 11/20/2015 10:13 AM

## 2015-11-21 ENCOUNTER — Encounter (HOSPITAL_COMMUNITY): Payer: Self-pay | Admitting: Psychiatry

## 2015-11-21 DIAGNOSIS — F1410 Cocaine abuse, uncomplicated: Secondary | ICD-10-CM

## 2015-11-21 DIAGNOSIS — F1010 Alcohol abuse, uncomplicated: Secondary | ICD-10-CM

## 2015-11-21 NOTE — Progress Notes (Signed)
[facility] MD Progress Note  11/21/2015 3:55 PM Richard [name]  MRN:  [id number] Subjective:  States that even while staying at the residential treatment program he started heating the voices. States they are fragmented and sometimes cant tell what they are saying but the tone is angry. States he needs help. The voices are not associated to his cocaine use. He states that he was so overwhelmed that he relapsed. He would like to go into a longer term treatment program. The situation he was dealing with before he is not dealing with anymore. The friend who killed the other friend is on the lose Principal Problem: MDD (major depressive disorder) ([place]) Diagnosis:   Patient Active Problem List   Diagnosis Date Noted  . Severe episode of recurrent major depressive disorder, with psychotic features ([place]) [F33.3]   . MDD (major depressive disorder) ([place]) [F32.9] 11/16/2015  . Severe episode of recurrent major depressive disorder, without psychotic features ([place]) [F33.2]   . MDD (major depressive disorder), recurrent episode, severe ([place]) [F33.2] 10/17/2015  . Cocaine abuse [F14.10] 10/17/2015  . Alcohol abuse [F10.10] 10/17/2015  . Severe single current episode of major depressive disorder, with psychotic features ([place]) [F32.3]   . Substance induced mood disorder ([place]) [F19.94] 10/08/2015  . Colostomy complication ([place]) [Y65.99]   . Colon cancer s/p chemo + radiation and resection. Now with colostomy bag [C18.9] 04/10/2015  . SVT (supraventricular tachycardia) ([place]) [I47.1] 04/10/2015  . Tobacco abuse [Z72.0] 04/10/2015   Total Time spent with patient: 20 minutes  Past Psychiatric History: see admission H and P  Past Medical History:  Past Medical History  Diagnosis Date  . Colon cancer ([place])   . Colon cancer ([place]) 2015  . Colostomy complication ([place]) 35-70-17    blood in colostomy bag    Past Surgical History  Procedure Laterality Date  . Partial colectomy  06/2014    done at [facility]   Family  History:  Family History  Problem Relation Age of Onset  . Coronary artery disease Cousin 73   Family Psychiatric  History: see admission H and P Social History:  History  Alcohol Use  . 0.6 oz/week  . 1 Cans of beer per week    Comment: drinks 2 times a week     History  Drug Use  . Yes  . Special: Marijuana, Cocaine    Comment: Lst used 3 days ago    Social History   Social History  . Marital Status: Single    Spouse Name: N/A  . Number of Children: N/A  . Years of Education: N/A   Social History Main Topics  . Smoking status: Current Every Day Smoker -- 0.50 packs/day    Types: Cigarettes  . Smokeless tobacco: Never Used  . Alcohol Use: 0.6 oz/week    1 Cans of beer per week     Comment: drinks 2 times a week  . Drug Use: Yes    Special: Marijuana, Cocaine     Comment: Lst used 3 days ago  . Sexual Activity: No   Other Topics Concern  . None   Social History Narrative   Additional Social History:                         Sleep: Fair  Appetite:  Fair  Current Medications: Current Facility-Administered Medications  Medication Dose Route Frequency Provider Last Rate Last Dose  . acetaminophen (TYLENOL) tablet 650 mg  650 mg Oral Q6H PRN Felicita Gage  A Cobos, MD   650 mg at 11/19/15 0737  . alum & mag hydroxide-simeth (MAALOX/MYLANTA) 200-200-20 MG/5ML suspension 30 mL  30 mL Oral Q4H PRN Derrill Center, NP      . ARIPiprazole (ABILIFY) tablet 20 mg  20 mg Oral Daily Jenne Campus, MD   20 mg at 11/21/15 0759  . aspirin-acetaminophen-caffeine (EXCEDRIN MIGRAINE) per tablet 2 tablet  2 tablet Oral Q8H PRN Derrill Center, NP   2 tablet at 11/21/15 0800  . cyclobenzaprine (FLEXERIL) tablet 5 mg  5 mg Oral TID PRN Nicholaus Bloom, MD   5 mg at 11/18/15 1300  . FLUoxetine (PROZAC) capsule 20 mg  20 mg Oral Daily Nicholaus Bloom, MD   20 mg at 11/21/15 0759  . hydrOXYzine (ATARAX/VISTARIL) tablet 25 mg  25 mg Oral Q6H PRN Jenne Campus, MD   25 mg at  11/18/15 0748  . magnesium hydroxide (MILK OF MAGNESIA) suspension 30 mL  30 mL Oral Daily PRN Derrill Center, NP      . traZODone (DESYREL) tablet 100 mg  100 mg Oral QHS Jenne Campus, MD   100 mg at 11/20/15 2230    Lab Results: No results found for this or any previous visit (from the past 48 hour(s)).  Blood Alcohol level:  Lab Results  Component Value Date   ETH <5 11/15/2015   ETH <5 10/07/2015    Physical Findings: AIMS: Facial and Oral Movements Muscles of Facial Expression: None, normal Lips and Perioral Area: None, normal Jaw: None, normal Tongue: None, normal,Extremity Movements Upper (arms, wrists, hands, fingers): None, normal Lower (legs, knees, ankles, toes): None, normal, Trunk Movements Neck, shoulders, hips: None, normal, Overall Severity Severity of abnormal movements (highest score from questions above): None, normal Incapacitation due to abnormal movements: None, normal Patient's awareness of abnormal movements (rate only patient's report): No Awareness, Dental Status Current problems with teeth and/or dentures?: No Does patient usually wear dentures?: No  CIWA:  CIWA-Ar Total: 1 COWS:  COWS Total Score: 1  Musculoskeletal: Strength & Muscle Tone: within normal limits Gait & Station: normal Patient leans: normal  Psychiatric Specialty Exam: Review of Systems  Constitutional: Positive for malaise/fatigue.  HENT: Negative.   Eyes: Negative.   Respiratory: Negative.   Cardiovascular: Negative.   Gastrointestinal: Negative.   Genitourinary: Negative.   Musculoskeletal: Negative.   Skin: Negative.   Neurological: Positive for dizziness and weakness.  Endo/Heme/Allergies: Negative.   Psychiatric/Behavioral: Positive for depression, hallucinations and substance abuse. The patient is nervous/anxious.     Blood pressure 113/78, pulse 77, temperature 98 F (36.7 C), temperature source Oral, resp. rate 20, height 5' 6.75" (1.695 m), weight 113.853 kg  (251 lb).Body mass index is 39.63 kg/(m^2).  General Appearance: Fairly Groomed  Engineer, water::  Fair  Speech:  Clear and Coherent  Volume:  fluctuates  Mood:  Anxious and Depressed  Affect:  anxious worried  Thought Process:  Coherent and Goal Directed  Orientation:  Full (Time, Place, and Person)  Thought Content:  symptoms events worries concerns  Suicidal Thoughts:  No  Homicidal Thoughts:  No  Memory:  Immediate;   Fair Recent;   Fair Remote;   Good  Judgement:  Fair  Insight:  Shallow  Psychomotor Activity:  Restlessness  Concentration:  Fair  Recall:  [place]: Fair  Akathisia:  No  Handed:  Right  AIMS (if indicated):     Assets:  Desire for  Improvement  ADL's:  Intact  Cognition: WNL  Sleep:  Number of Hours: 6.75   Treatment Plan Summary: Daily contact with patient to assess and evaluate symptoms and progress in treatment and Medication management Supportive approach/coping skills Cocaine dependence; continue to work a relapse prevention plan Depression; continue to work with the Prozac 20 mg daily and optimize dose and response Hallucinations; continue to work with the Abilify 20 mg and optimize dose and response Work with CBT/mindfulness Explore residential treatment options  Severin Bou A, MD 11/21/2015, 3:55 PM

## 2015-11-21 NOTE — Progress Notes (Signed)
D:Patient in the dayroom on approach.  Patient states he had abetter day today.  Patient states he continues to have headaches and hears voices mumbling.  Patient states he is still unsure of his discharge plan.  Patient states he is trying to take it one day at a time.  Patient denies SI/HI and denies AVH.  A: Staff to monitor Q 15 mins for safety.  Encouragement and support offered.  Scheduled medications administered per orders.  Excedrin migraine administered prn for a headache. R: Patient remains safe on the unit.  Patient attended group tonight.  Patient visible on the unit and taking administered medications.

## 2015-11-21 NOTE — Progress Notes (Signed)
[place] Group Notes:  (Nursing/MHT/Case Management/Adjunct)  Date:  11/21/2015  Time:  2100 Type of Therapy:  wrap up group  Participation Level:  Active  Participation Quality:  Appropriate, Attentive, Sharing and Supportive  Affect:  Flat  Cognitive:  Appropriate  Insight:  Improving  Engagement in Group:  Engaged  Modes of Intervention:  Clarification, Education and Support  Summary of Progress/Problems:  [name] 11/21/2015, 10:24 PM

## 2015-11-21 NOTE — Progress Notes (Signed)
Patient ID: Richard [name], male   DOB: 1966/06/15, 50 y.o.   MRN: [id number]   Pt currently presents with a flat affect and depressed behavior. Per self inventory, pt rates depression, hopelessness and anxiety at a 9. Pt's daily goal is to "ret rid of headaches and voices" and they intend to do so by "see doctor." Pt reports fair sleep, a fair appetite, low energy and poor concentration.  Pt provided with medications per providers orders. Pt's labs and vitals were monitored throughout the day. Pt supported emotionally and encouraged to express concerns and questions. Pt educated on medications.  Pt's safety ensured with 15 minute and environmental checks. Pt endorses auditory hallucinations of "mumbling and voices" but states that they are not command nor do they do not frighten him. Pt currently denies SI/HI and visual hallucinations. Pt verbally agrees to seek staff if SI/HI or VH occurs and to consult with staff before acting on any harmful thoughts. Will continue POC.

## 2015-11-21 NOTE — Progress Notes (Signed)
Adult Psychoeducational Group Note  Date:  11/21/2015 Time: 09:00  Group Topic/Focus:  Self Esteem Action Plan:   The focus of this group is to help patients create a plan to continue to build self-esteem after discharge.  Participation Level:  Did Not Attend  Participation Quality: n/a  Affect: n/a  Cognitive: n/a  Insight: n/a  Engagement in Group: n/a  Modes of Intervention:  Activity, Discussion, Education and Support  Additional Comments:  Pt in bed asleep.   Elenore Rota 11/21/2015, 9:35 AM

## 2015-11-22 NOTE — Progress Notes (Signed)
[facility] MD Progress Note  11/22/2015 5:13 PM Richard [name]  MRN:  [id number] Subjective:  States that he was doing ok.  He was in the dayroom.  He does maintain that he is still hearing voices.  "Dr Sabra Heck adjusted my meds and I think they are working better." Objective:  States that even while staying at the residential treatment program he started heating the voices.  The voices are not associated to his cocaine use.  He states that he was so overwhelmed that he relapsed. He would like to go into a longer term treatment program. The situation he was dealing with before he is not dealing with anymore. The friend who killed the other friend is on the lose Principal Problem: Severe episode of recurrent major depressive disorder, with psychotic features ([place]) Diagnosis:   Patient Active Problem List   Diagnosis Date Noted  . Severe episode of recurrent major depressive disorder, with psychotic features ([place]) [F33.3]   . MDD (major depressive disorder) ([place]) [F32.9] 11/16/2015  . Severe episode of recurrent major depressive disorder, without psychotic features ([place]) [F33.2]   . MDD (major depressive disorder), recurrent episode, severe ([place]) [F33.2] 10/17/2015  . Cocaine abuse [F14.10] 10/17/2015  . Alcohol abuse [F10.10] 10/17/2015  . Severe single current episode of major depressive disorder, with psychotic features ([place]) [F32.3]   . Substance induced mood disorder ([place]) [F19.94] 10/08/2015  . 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 Colon cancer s/p chemo + radiation and resection. Now with colostomy bag [C18.9] 04/10/2015  . SVT (supraventricular tachycardia) ([place]) [I47.1] 04/10/2015  . Tobacco abuse [Z72.0] 04/10/2015   Total Time spent with patient: 20 minutes  Past Psychiatric History: see admission H and P  Past Medical History:  Past Medical History  Diagnosis Date  . Colon cancer ([place])   . Colon cancer ([place]) 2015  . Colostomy complication ([place]) 68-12-75    blood in colostomy bag     Past Surgical History  Procedure Laterality Date  . Partial colectomy  06/2014    done at [facility]   Family History:  Family History  Problem Relation Age of Onset  . Coronary artery disease Cousin 79   Family Psychiatric  History: see admission H and P Social History:  History  Alcohol Use  . 0.6 oz/week  . 1 Cans of beer per week    Comment: drinks 2 times a week     History  Drug Use  . Yes  . Special: Marijuana, Cocaine    Comment: Lst used 3 days ago    Social History   Social History  . Marital Status: Single    Spouse Name: N/A  . Number of Children: N/A  . Years of Education: N/A   Social History Main Topics  . Smoking status: Current Every Day Smoker -- 0.50 packs/day    Types: Cigarettes  . Smokeless tobacco: Never Used  . Alcohol Use: 0.6 oz/week    1 Cans of beer per week     Comment: drinks 2 times a week  . Drug Use: Yes    Special: Marijuana, Cocaine     Comment: Lst used 3 days ago  . Sexual Activity: No   Other Topics Concern  . None   Social History Narrative   Additional Social History:                         Sleep: Fair  Appetite:  Fair  Current Medications: Current Facility-Administered Medications  Medication Dose Route Frequency Provider Last Rate Last Dose  . acetaminophen (TYLENOL) tablet 650 mg  650 mg Oral Q6H PRN Jenne Campus, MD   650 mg at 11/19/15 0737  . alum & mag hydroxide-simeth (MAALOX/MYLANTA) 200-200-20 MG/5ML suspension 30 mL  30 mL Oral Q4H PRN Derrill Center, NP      . ARIPiprazole (ABILIFY) tablet 20 mg  20 mg Oral Daily Jenne Campus, MD   20 mg at 11/22/15 0833  . aspirin-acetaminophen-caffeine (EXCEDRIN MIGRAINE) per tablet 2 tablet  2 tablet Oral Q8H PRN Derrill Center, NP   2 tablet at 11/22/15 [phone]  . cyclobenzaprine (FLEXERIL) tablet 5 mg  5 mg Oral TID PRN Nicholaus Bloom, MD   5 mg at 11/18/15 1300  . FLUoxetine (PROZAC) capsule 20 mg  20 mg Oral Daily Nicholaus Bloom, MD   20 mg at  11/22/15 9702  . hydrOXYzine (ATARAX/VISTARIL) tablet 25 mg  25 mg Oral Q6H PRN Jenne Campus, MD   25 mg at 11/18/15 0748  . magnesium hydroxide (MILK OF MAGNESIA) suspension 30 mL  30 mL Oral Daily PRN Derrill Center, NP      . traZODone (DESYREL) tablet 100 mg  100 mg Oral QHS Jenne Campus, MD   100 mg at 11/21/15 2148    Lab Results: No results found for this or any previous visit (from the past 48 hour(s)).  Blood Alcohol level:  Lab Results  Component Value Date   ETH <5 11/15/2015   ETH <5 10/07/2015    Physical Findings: AIMS: Facial and Oral Movements Muscles of Facial Expression: None, normal Lips and Perioral Area: None, normal Jaw: None, normal Tongue: None, normal,Extremity Movements Upper (arms, wrists, hands, fingers): None, normal Lower (legs, knees, ankles, toes): None, normal, Trunk Movements Neck, shoulders, hips: None, normal, Overall Severity Severity of abnormal movements (highest score from questions above): None, normal Incapacitation due to abnormal movements: None, normal Patient's awareness of abnormal movements (rate only patient's report): No Awareness, Dental Status Current problems with teeth and/or dentures?: No Does patient usually wear dentures?: No  CIWA:  CIWA-Ar Total: 1 COWS:  COWS Total Score: 1  Musculoskeletal: Strength & Muscle Tone: within normal limits Gait & Station: normal Patient leans: normal  Psychiatric Specialty Exam: Review of Systems  Constitutional: Positive for malaise/fatigue.  HENT: Negative.   Eyes: Negative.   Respiratory: Negative.   Cardiovascular: Negative.   Gastrointestinal: Negative.   Genitourinary: Negative.   Musculoskeletal: Negative.   Skin: Negative.   Neurological: Positive for dizziness and weakness.  Endo/Heme/Allergies: Negative.   Psychiatric/Behavioral: Positive for depression, hallucinations and substance abuse. The patient is nervous/anxious.     Blood pressure 110/80, pulse 90,  temperature 97.8 F (36.6 C), temperature source Oral, resp. rate 20, height 5' 6.75" (1.695 m), weight 113.853 kg (251 lb).Body mass index is 39.63 kg/(m^2).  General Appearance: Fairly Groomed  Engineer, water::  Fair  Speech:  Clear and Coherent  Volume:  fluctuates  Mood:  Anxious and Depressed  Affect:  anxious worried  Thought Process:  Coherent and Goal Directed  Orientation:  Full (Time, Place, and Person)  Thought Content:  symptoms events worries concerns  Suicidal Thoughts:  No  Homicidal Thoughts:  No  Memory:  Immediate;   Fair Recent;   Fair Remote;   Good  Judgement:  Fair  Insight:  Shallow  Psychomotor Activity:  Restlessness  Concentration:  Fair  Recall:  [employer] of Knowledge:Fair  Language: Fair  Akathisia:  No  Handed:  Right  AIMS (if indicated):     Assets:  Desire for Improvement  ADL's:  Intact  Cognition: WNL  Sleep:  Number of Hours: 5.75   Treatment Plan Summary: Daily contact with patient to assess and evaluate symptoms and progress in treatment and Medication management Supportive approach/coping skills Cocaine dependence; continue to work a relapse prevention plan Depression; continue to work with the Prozac 20 mg daily and optimize dose and response Hallucinations; continue to work with the Abilify 20 mg and optimize dose and response Work with CBT/mindfulness Explore residential treatment options  [place], NP  [facility]  11/22/2015, 5:13 PM I agree with assessment and plan Geralyn Flash A. Sabra Heck, M.D.

## 2015-11-22 NOTE — BHH Group Notes (Signed)
[facility] LCSW Aftercare Discharge Planning Group Note   11/22/2015 9:24 AM  Participation Quality:  Invited. DID NOT ATTEND. Pt chose to remain in bed.   Smart, Shawnya Mayor LCSW

## 2015-11-22 NOTE — Progress Notes (Signed)
Recreation Therapy Notes   Date: 03.10.2017 Time: 9:30am Location: 300 Hall Group Room   Group Topic: Stress Management  Goal Area(s) Addresses:  Patient will actively participate in stress management techniques presented during session.   Behavioral Response: Did not attend.    Laureen Ochs Analiyah Lechuga, LRT/CTRS        Cynai Skeens L 11/22/2015 10:21 AM

## 2015-11-22 NOTE — Tx Team (Signed)
Interdisciplinary Treatment Plan Update (Adult)  Date:  11/22/2015  Time Reviewed:  9:25 AM   Progress in Treatment: Attending groups: Intermittently  Participating in groups:  Yes, when he attends  Taking medication as prescribed:  Yes. Tolerating medication:  Yes. Family/Significant othe contact made:  SPE completed with pt, as he declined to consent to family contact.  Patient understands diagnosis:  Yes. and As evidenced by:  seeking treatment for psychosis, SI, depression, alcohol abuse. Discussing patient identified problems/goals with staff:  Yes. Medical problems stabilized or resolved:  Yes. Denies suicidal/homicidal ideation: Yes. Issues/concerns per patient self-inventory:  Other:  Discharge Plan or Barriers: Pt met with [facility] and [facility] program staff through the [facility] to discuss potential placement options. Pt referred to [facility] due to severity of mental illness/substance abuse issues.   Reason for Continuation of Hospitalization: Depression AH Medication stabilization  Comments:  Richard [name] is an 50 y.o. male. Patient was brought into the ED by EMS because of issues with his colostomy and suicidal ideation with psychosis. Patient continues to endorse suicidal ideations with plan and command hallucinations. Patient reports drinking a gallon liquor daily and last drinking one day ago. Patient was admitted twice in the last 60 days at [facility] for similar symptoms. Diagnosis: Substance Induced Mood Disorder  Estimated length of stay:  2-4 days   Additional Comments:  Patient and CSW reviewed pt's identified goals and treatment plan. Patient verbalized understanding and agreed to treatment plan. CSW reviewed [facility] "Discharge Process and Patient Involvement" Form. Pt verbalized understanding of information provided and signed form.    Review of initial/current patient goals per problem list:  1. Goal(s): Patient will participate in aftercare plan  Met: Yes.    Target date: at discharge  As evidenced by: Patient will participate within aftercare plan AEB aftercare provider and housing plan at discharge being identified.  3/6: CSW assessing for appropriate referrals.   3/10: Pt referred to [place]; [facility], technical sales and given [employer]. He also met with [facility] program. CSW continuing to assess for appropriate referrals.   2. Goal (s): Patient will exhibit decreased depressive symptoms and suicidal ideations.  Met: No.    Target date: at discharge  As evidenced by: Patient will utilize self rating of depression at 3 or below and demonstrate decreased signs of depression or be deemed stable for discharge by MD.  3/6: Pt rates depression as high. Denies SI/HI/AVH.   3/10: Pt rates depression as 8/10 and presents with depressed mood/lethargic affect. He continues to endorse AH-mumbling.   3. Goal(s): Patient will demonstrate decreased signs of withdrawal due to substance abuse  Met:No.   Target date:at discharge   As evidenced by: Patient will produce a CIWA/COWS score of 0, have stable vitals signs, and no symptoms of withdrawal.  3/6: Pt reports mild withdrawals with CIWA of 2 and stable vitals this morning.   3/10: Pt reports no signs of withdrawal with CIWA/COWS of 0 and stable vitals.   Attendees: Patient:   11/22/2015 9:25 AM   Family:   11/22/2015 9:25 AM   Physician:  Dr. Carlton Adam MD  11/22/2015 9:25 AM   Nursing:  Charise Carwin RN 11/22/2015 9:25 AM   Clinical Social Worker: Maxie Better, LCSW 11/22/2015 9:25 AM   Clinical Social Worker: Erasmo Downer Drinkard LCSW; Peri Maris LCSWA 11/22/2015 9:25 AM   Other:  Gerline Legacy Nurse Case Manager 11/22/2015 9:25 AM   Other:  Agustina Caroli NP 11/22/2015 9:25 AM  Other:   11/22/2015 9:25 AM   Other:  11/22/2015 9:25 AM   Other:  11/22/2015 9:25 AM   Other:  11/22/2015 9:25 AM    11/22/2015 9:25 AM    11/22/2015 9:25 AM    11/22/2015 9:25 AM    11/22/2015 9:25 AM    Scribe for  Treatment Team:   Maxie Better, LCSW 11/22/2015 9:25 AM

## 2015-11-22 NOTE — Progress Notes (Signed)
DAR NOTE: Pt present with flat affect and depressed mood in the unit. Pt has been present in the milieu watching TV. Pt complained of headache, took all his meds as scheduled. As per self inventory, pt had a fair night sleep, fair appetite, low energy, and poor concentration. Pt rate depression at 8, hopeless ness at 8, and anxiety at 8. Pt's goal for today is " get rid of headaches and voices." Pt's safety ensured with 15 minute and environmental checks. Pt currently denies SI/HI and A/V hallucinations. Pt verbally agrees to seek staff if SI/HI or A/VH occurs and to consult with staff before acting on these thoughts. Will continue POC.

## 2015-11-22 NOTE — BHH Group Notes (Signed)
[place] LCSW Group Therapy  11/22/2015 1:13 PM  Type of Therapy:  Group Therapy  Participation Level:  Active  Participation Quality:  Drowsy  Affect:  Appropriate  Cognitive:  Alert  Insight:  Improving  Engagement in Therapy:  Improving  Modes of Intervention:  Confrontation, Discussion, Education, Exploration, Problem-solving, Rapport Building, Socialization and Support  Summary of Progress/Problems: Feelings around Relapse. Group members discussed the meaning of relapse and shared personal stories of relapse, how it affected them and others, and how they perceived themselves during this time. Group members were encouraged to identify triggers, warning signs and coping skills used when facing the possibility of relapse. Social supports were discussed and explored in detail. Post Acute Withdrawal Syndrome (handout provided) was introduced and examined. Pt's were encouraged to ask questions, talk about key points associated with PAWS, and process this information in terms of relapse prevention. Richard [name] was lethargic but attempted to remain awake during group. He was able to participate in group discussion and contribute to the sharing of community resources for other individuals who are homeless. Richard [name] states that he is hoping to get his mental health to a point where he can function day to day and enter an [place] or halfway house.   Smart, Richard Pickup LCSW 11/22/2015, 1:13 PM

## 2015-11-23 NOTE — Progress Notes (Signed)
Patient was seen sleeping at the time this writer took over from the outgoing nurse @ 23:00. Respiration even and unlabored. Every 15 minutes check maintained for safety. Will continue to monitor patient.

## 2015-11-23 NOTE — Progress Notes (Signed)
[name] MD Progress Note  11/23/2015 2:23 PM Richard [name]  MRN:  [id number]   Subjective:  Patient still endorsing auditory hallucinations. Patient reports " Today is the first day that I don't have a headache."  Objective:  [name] is awake, alert and oriented X4 , found resting in the dayroom interacting with peers.  Denies suicidal or homicidal ideation. Reports auditory hallucination that are chronic in nature. Denies visual hallucination and does not appear to be responding to internal stimuli.  Patient reports his current medications are helping now that they have been increased. Patient reports interacting well with staff and others. Patient reports he is medication compliant without medication side effects. Report learning new coping skills and states that he feels better to know that he is not the only one with "issues". States his depression 7/10.  Reports good appetite and reports he is resting well.  Support, encouragement and reassurance was provided.   Principal Problem: Severe episode of recurrent major depressive disorder, with psychotic features ([facility]) Diagnosis:   Patient Active Problem List   Diagnosis Date Noted  . Severe episode of recurrent major depressive disorder, with psychotic features ([facility]) [F33.3]   . MDD (major depressive disorder) ([facility]) [F32.9] 11/16/2015  . Severe episode of recurrent major depressive disorder, without psychotic features ([facility]) [F33.2]   . MDD (major depressive disorder), recurrent episode, severe ([facility]) [F33.2] 10/17/2015  . Cocaine abuse [F14.10] 10/17/2015  . Alcohol abuse [F10.10] 10/17/2015  . Severe single current episode of major depressive disorder, with psychotic features ([facility]) [F32.3]   . Substance induced mood disorder ([facility]) [F19.94] 10/08/2015  . Colostomy complication ([facility]) [K94.00]   . Colon cancer s/p chemo + radiation and resection. Now with colostomy bag [C18.9] 04/10/2015  . SVT (supraventricular tachycardia) ([facility]) [I47.1]  04/10/2015  . Tobacco abuse [Z72.0] 04/10/2015   Total Time spent with patient: 20 minutes  Past Psychiatric History: see admission H and P  Past Medical History:  Past Medical History  Diagnosis Date  . Colon cancer ([facility])   . Colon cancer ([facility]) 2015  . Colostomy complication ([facility]) 09-13-15    blood in colostomy bag    Past Surgical History  Procedure Laterality Date  . Partial colectomy  06/2014    done at [facility]   Family History:  Family History  Problem Relation Age of Onset  . Coronary artery disease Cousin 46   Family Psychiatric  History: see admission H and P Social History:  History  Alcohol Use  . 0.6 oz/week  . 1 Cans of beer per week    Comment: drinks 2 times a week     History  Drug Use  . Yes  . Special: Marijuana, Cocaine    Comment: Lst used 3 days ago    Social History   Social History  . Marital Status: Single    Spouse Name: N/A  . Number of Children: N/A  . Years of Education: N/A   Social History Main Topics  . Smoking status: Current Every Day Smoker -- 0.50 packs/day    Types: Cigarettes  . Smokeless tobacco: Never Used  . Alcohol Use: 0.6 oz/week    1 Cans of beer per week     Comment: drinks 2 times a week  . Drug Use: Yes    Special: Marijuana, Cocaine     Comment: Lst used 3 days ago  . Sexual Activity: No   Other Topics Concern  . None   Social History Narrative   Additional Social  History:                         Sleep: Fair improved  Appetite:  Fair  Current Medications: Current Facility-Administered Medications  Medication Dose Route Frequency Provider Last Rate Last Dose  . acetaminophen (TYLENOL) tablet 650 mg  650 mg Oral Q6H PRN Jenne Campus, MD   650 mg at 11/19/15 0737  . alum & mag hydroxide-simeth (MAALOX/MYLANTA) 200-200-20 MG/5ML suspension 30 mL  30 mL Oral Q4H PRN Derrill Center, NP      . ARIPiprazole (ABILIFY) tablet 20 mg  20 mg Oral Daily Jenne Campus, MD   20 mg at 11/23/15  0810  . aspirin-acetaminophen-caffeine (EXCEDRIN MIGRAINE) per tablet 2 tablet  2 tablet Oral Q8H PRN Derrill Center, NP   2 tablet at 11/22/15 [phone]  . cyclobenzaprine (FLEXERIL) tablet 5 mg  5 mg Oral TID PRN Nicholaus Bloom, MD   5 mg at 11/18/15 1300  . FLUoxetine (PROZAC) capsule 20 mg  20 mg Oral Daily Nicholaus Bloom, MD   20 mg at 11/23/15 0811  . hydrOXYzine (ATARAX/VISTARIL) tablet 25 mg  25 mg Oral Q6H PRN Jenne Campus, MD   25 mg at 11/18/15 0748  . magnesium hydroxide (MILK OF MAGNESIA) suspension 30 mL  30 mL Oral Daily PRN Derrill Center, NP      . traZODone (DESYREL) tablet 100 mg  100 mg Oral QHS Jenne Campus, MD   100 mg at 11/21/15 2148    Lab Results: No results found for this or any previous visit (from the past 48 hour(s)).  Blood Alcohol level:  Lab Results  Component Value Date   ETH <5 11/15/2015   ETH <5 10/07/2015    Physical Findings: AIMS: Facial and Oral Movements Muscles of Facial Expression: None, normal Lips and Perioral Area: None, normal Jaw: None, normal Tongue: None, normal,Extremity Movements Upper (arms, wrists, hands, fingers): None, normal Lower (legs, knees, ankles, toes): None, normal, Trunk Movements Neck, shoulders, hips: None, normal, Overall Severity Severity of abnormal movements (highest score from questions above): None, normal Incapacitation due to abnormal movements: None, normal Patient's awareness of abnormal movements (rate only patient's report): No Awareness, Dental Status Current problems with teeth and/or dentures?: No Does patient usually wear dentures?: No  CIWA:  CIWA-Ar Total: 1 COWS:  COWS Total Score: 1  Musculoskeletal: Strength & Muscle Tone: within normal limits Gait & Station: normal Patient leans: normal  Psychiatric Specialty Exam: Review of Systems  Eyes: Negative.   Respiratory: Negative.   Cardiovascular: Negative.   Gastrointestinal: Negative.   Genitourinary: Negative.   Musculoskeletal:  Negative.   Skin: Negative.   Neurological: Positive for headaches.  Endo/Heme/Allergies: Negative.   Psychiatric/Behavioral: Positive for depression, hallucinations and substance abuse. The patient is nervous/anxious.   All other systems reviewed and are negative.   Blood pressure 118/96, pulse 76, temperature 98.2 F (36.8 C), temperature source Oral, resp. rate 16, height 5' 6.75" (1.695 m), weight 113.853 kg (251 lb).Body mass index is 39.63 kg/(m^2).  General Appearance: Casual  Eye Contact::  Fair  Speech:  Clear and Coherent  Volume:  fluctuates  Mood:  Anxious and Depressed  Affect:  anxious worried  Thought Process:  Coherent and Goal Directed  Orientation:  Full (Time, Place, and Person)  Thought Content:  symptoms events worries concerns  Suicidal Thoughts:  No  Homicidal Thoughts:  No  Memory:  Immediate;  Fair Recent;   Fair Remote;   Good  Judgement:  Fair  Insight:  Shallow  Psychomotor Activity:  Restlessness-improving. Per staff patient is more visible on the unit.  Concentration:  Fair  Recall:  [place]: Fair  Akathisia:  No  Handed:  Right  AIMS (if indicated):     Assets:  Desire for Improvement  ADL's:  Intact  Cognition: WNL  Sleep:  Number of Hours: 5.75    I agree with current treatment plan on 11/23/2015, Patient seen face-to-face for psychiatric evaluation follow-up, chart reviewed. Reviewed the information documented and agree with the treatment plan.  Treatment Plan Summary: Daily contact with patient to assess and evaluate symptoms and progress in treatment and Medication management Supportive approach/coping skills Cocaine dependence; continue to work a relapse prevention plan Depression; continue to work with the Prozac 20 mg daily and optimize dose and response Hallucinations; continue to work with the Abilify 20 mg and optimize dose and response Work with CBT/mindfulness Explore residential treatment  options   Derrill Center, NP [facility]  11/23/2015, 2:23 PM  I reviewed chart and agreed with the findings and treatment Plan.  Berniece Andreas, MD

## 2015-11-23 NOTE — Progress Notes (Signed)
D: Patient reports that he was feeling "ok."  He stated that he is still feeling depressed. Patient has been watching TV.  He stated that he is having no pain. He rated his day a five. He stated that he wanted to work on his depression. Denies SI, HI, or AVH.  A: Encourage and support given. Safety checks maintained q 15 min and observation maintained.   R: Patient compliant with treatment. Will continue to monitor.

## 2015-11-23 NOTE — BHH Group Notes (Signed)
[place] Group Notes:  (Nursing/MHT/Case Management/Adjunct)  Date:  11/23/2015  Time:  1300  Type of Therapy:  Nurse Education  Participation Level:  Minimal  Participation Quality:  Drowsy  Affect:  Appropriate  Cognitive:  Appropriate  Insight:  Improving  Engagement in Group:  Improving  Modes of Intervention:  Activity, Clarification, Socialization and Support  Summary of Progress/Problems:  Richard [name] 11/23/2015, 4:08 PM

## 2015-11-23 NOTE — [facility] Group Notes (Signed)
[facility] Group Notes:  (Clinical Social Work)   07/13/2015     10:00-11:00AM  Summary of Progress/Problems:   In today's process group a decisional balance exercise was used to explore in depth the perceived benefits and costs of alcohol and drugs, as well as the  benefits and costs of replacing these with healthy coping skills.  Patients listed unhealthy coping techniques, determining with CSW guidance that unhealthy coping techniques work initially, but eventually become harmful.  Motivational Interviewing and the whiteboard were utilized for the exercises.  The patient seemed to listen attentively throughout group but did not speak at all, even when offered the opportunity directly.  He followed the discussion with his eyes, but did not contribute.  Type of Therapy:  Group Therapy - Process   Participation Level:  Minimal  Participation Quality:  Attentive  Affect:  Depressed and Flat  Cognitive:  Oriented  Insight:  Limited  Engagement in Therapy: Limited  Modes of Intervention:  Education, Motivational Interviewing  Selmer Dominion, LCSW 11/23/2015, 12:47 PM

## 2015-11-23 NOTE — Progress Notes (Signed)
D:Per patient self inventory form pt reports he slept fair last night. Pt reports a fair appetite, low energy level, poor concentration. Pt rates depression 8/10, hopelessness 8/10, anxiety 8/10- all on 0-10 scale, 10 being the worse. When asked pt denies SI. On self inventory sheet pt reports "SI sometimes" When followed up with pt, pt reports at the time this nurse asked, he was not having self harm thoughts. Pt denies plan and reports he feels safe in the hospital. Pt reports his goal is "get rid of headaches and voices."  A:Special checks q 15 mins in place for safety. Medication administered per MD order (see eMAR). Encouragement and support provided.  R:Safety maintained. Compliant with medication regimen. Will continue to monitor.

## 2015-11-24 MED ORDER — TOPIRAMATE 25 MG PO TABS
50.0000 mg | ORAL_TABLET | Freq: Every day | ORAL | Status: DC
  Administered 2015-11-24 – 2015-11-26 (×3): 50 mg via ORAL
  Filled 2015-11-24 (×5): qty 2

## 2015-11-24 NOTE — Progress Notes (Signed)
[facility] MD Progress Note  11/24/2015 12:56 PM Richard [name]  MRN:  [id number]   Subjective:  Patient still endorsing auditory hallucinations. Patient reports "my headache is back."    Objective:  Richard [name] is awake, alert and oriented X4 , found resting in the dayroom interacting with peers.  Denies suicidal or homicidal ideation. Reports auditory hallucination that are chronic in nature. Denies visual hallucination and does not appear to be responding to internal stimuli.  Patient reports his current medications are helping now that they have been increased. Patient reports interacting well with staff and others. Patient reports he is medication compliant without medication side effects. Report learning new coping skills and states that he feels better to know that he is not the only one with "issues".  Patient reports my headache is 10/10 today and I just took something for pain. Reports that she headache comes and goes. States his depression 7/10.  Reports good appetite and reports he is resting well.  Support, encouragement and reassurance was provided.   Principal Problem: Severe episode of recurrent major depressive disorder, with psychotic features ([place]) Diagnosis:   Patient Active Problem List   Diagnosis Date Noted  . Severe episode of recurrent major depressive disorder, with psychotic features ([place]) [F33.3]   . MDD (major depressive disorder) ([place]) [F32.9] 11/16/2015  . Severe episode of recurrent major depressive disorder, without psychotic features ([place]) [F33.2]   . MDD (major depressive disorder), recurrent episode, severe ([place]) [F33.2] 10/17/2015  . Cocaine abuse [F14.10] 10/17/2015  . Alcohol abuse [F10.10] 10/17/2015  . Severe single current episode of major depressive disorder, with psychotic features ([place]) [F32.3]   . Substance induced mood disorder ([place]) [F19.94] 10/08/2015  . 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 Colon cancer s/p chemo + radiation and resection. Now with  colostomy bag [C18.9] 04/10/2015  . SVT (supraventricular tachycardia) ([place]) [I47.1] 04/10/2015  . Tobacco abuse [Z72.0] 04/10/2015   Total Time spent with patient: 20 minutes  Past Psychiatric History: see admission H and P  Past Medical History:  Past Medical History  Diagnosis Date  . Colon cancer ([place])   . Colon cancer ([place]) 2015  . Colostomy complication ([place]) 55-37-48    blood in colostomy bag    Past Surgical History  Procedure Laterality Date  . Partial colectomy  06/2014    done at [facility]   Family History:  Family History  Problem Relation Age of Onset  . Coronary artery disease Cousin 19   Family Psychiatric  History: see admission H and P Social History:  History  Alcohol Use  . 0.6 oz/week  . 1 Cans of beer per week    Comment: drinks 2 times a week     History  Drug Use  . Yes  . Special: Marijuana, Cocaine    Comment: Lst used 3 days ago    Social History   Social History  . Marital Status: Single    Spouse Name: N/A  . Number of Children: N/A  . Years of Education: N/A   Social History Main Topics  . Smoking status: Current Every Day Smoker -- 0.50 packs/day    Types: Cigarettes  . Smokeless tobacco: Never Used  . Alcohol Use: 0.6 oz/week    1 Cans of beer per week     Comment: drinks 2 times a week  . Drug Use: Yes    Special: Marijuana, Cocaine     Comment: Lst used 3 days ago  . Sexual Activity: No  Other Topics Concern  . None   Social History Narrative   Additional Social History:                         Sleep: Fair improved  Appetite:  Fair  Current Medications: Current Facility-Administered Medications  Medication Dose Route Frequency Provider Last Rate Last Dose  . acetaminophen (TYLENOL) tablet 650 mg  650 mg Oral Q6H PRN Jenne Campus, MD   650 mg at 11/19/15 0737  . alum & mag hydroxide-simeth (MAALOX/MYLANTA) 200-200-20 MG/5ML suspension 30 mL  30 mL Oral Q4H PRN Derrill Center, NP      .  ARIPiprazole (ABILIFY) tablet 20 mg  20 mg Oral Daily Jenne Campus, MD   20 mg at 11/24/15 0743  . aspirin-acetaminophen-caffeine (EXCEDRIN MIGRAINE) per tablet 2 tablet  2 tablet Oral Q8H PRN Derrill Center, NP   2 tablet at 11/24/15 0744  . cyclobenzaprine (FLEXERIL) tablet 5 mg  5 mg Oral TID PRN Nicholaus Bloom, MD   5 mg at 11/18/15 1300  . FLUoxetine (PROZAC) capsule 20 mg  20 mg Oral Daily Nicholaus Bloom, MD   20 mg at 11/24/15 0743  . hydrOXYzine (ATARAX/VISTARIL) tablet 25 mg  25 mg Oral Q6H PRN Jenne Campus, MD   25 mg at 11/18/15 0748  . magnesium hydroxide (MILK OF MAGNESIA) suspension 30 mL  30 mL Oral Daily PRN Derrill Center, NP      . traZODone (DESYREL) tablet 100 mg  100 mg Oral QHS Jenne Campus, MD   100 mg at 11/23/15 2142    Lab Results: No results found for this or any previous visit (from the past 48 hour(s)).  Blood Alcohol level:  Lab Results  Component Value Date   ETH <5 11/15/2015   ETH <5 10/07/2015    Physical Findings: AIMS: Facial and Oral Movements Muscles of Facial Expression: None, normal Lips and Perioral Area: None, normal Jaw: None, normal Tongue: None, normal,Extremity Movements Upper (arms, wrists, hands, fingers): None, normal Lower (legs, knees, ankles, toes): None, normal, Trunk Movements Neck, shoulders, hips: None, normal, Overall Severity Severity of abnormal movements (highest score from questions above): None, normal Incapacitation due to abnormal movements: None, normal Patient's awareness of abnormal movements (rate only patient's report): No Awareness, Dental Status Current problems with teeth and/or dentures?: No Does patient usually wear dentures?: No  CIWA:  CIWA-Ar Total: 1 COWS:  COWS Total Score: 1  Musculoskeletal: Strength & Muscle Tone: within normal limits Gait & Station: normal Patient leans: normal  Psychiatric Specialty Exam: Review of Systems  Eyes: Negative.   Respiratory: Negative.    Cardiovascular: Negative.   Gastrointestinal: Negative.   Genitourinary: Negative.   Musculoskeletal: Negative.   Skin: Negative.   Neurological: Positive for headaches.  Endo/Heme/Allergies: Negative.   Psychiatric/Behavioral: Positive for depression, hallucinations and substance abuse. The patient is nervous/anxious.   All other systems reviewed and are negative.   Blood pressure 148/72, pulse 82, temperature 97.4 F (36.3 C), temperature source Oral, resp. rate 24, height 5' 6.75" (1.695 m), weight 113.853 kg (251 lb).Body mass index is 39.63 kg/(m^2).  General Appearance: Casual  Eye Contact::  Fair  Speech:  Clear and Coherent  Volume:  Normal  Mood:  Anxious and Depressed  Affect:  anxious worried  Thought Process:  Coherent and Goal Directed  Orientation:  Full (Time, Place, and Person)  Thought Content:  symptoms events worries concerns  Suicidal Thoughts:  No  Homicidal Thoughts:  No  Memory:  Immediate;   Fair Recent;   Fair Remote;   Good  Judgement:  Fair  Insight:  Shallow  Psychomotor Activity:  Restlessness-improving. Per staff patient is more visible on the unit.  Concentration:  Fair  Recall:  [place]: Fair  Akathisia:  No  Handed:  Right  AIMS (if indicated):     Assets:  Desire for Improvement  ADL's:  Intact  Cognition: WNL  Sleep:  Number of Hours: 5.75    I agree with current treatment plan on 11/24/2015, Patient seen face-to-face for psychiatric evaluation follow-up, chart reviewed. Reviewed the information documented and agree with the treatment plan.  Treatment Plan Summary: Daily contact with patient to assess and evaluate symptoms and progress in treatment and Medication management  Patient may need to Follow-up with MRI/CT or [place] for headache pain Start Topamax 50 mg PO daily for headache pain  Discontinue Excedrin migraine 2 tables  Supportive approach/coping skills Cocaine dependence; continue to work a  relapse prevention plan Depression; continue to work with the Prozac 20 mg daily and optimize dose and response Hallucinations; continue to work with the Abilify 20 mg and optimize dose and response Work with CBT/mindfulness Explore residential treatment options   Derrill Center, NP [facility]  11/24/2015, 12:56 PM  I reviewed chart and agreed with the findings and treatment Plan.  Berniece Andreas, MD

## 2015-11-24 NOTE — Progress Notes (Signed)
Patient ID: Richard [name], male   DOB: September 07, 1966, 50 y.o.   MRN: [id number]  DAR: Pt. Denies SI/HI and A/V Hallucinations. He reports sleep is fair, appetite is fair, energy level is low, and concentration is poor. He rates depression, anxiety, and hopelessness 7/10. Patient does report a headache that is ongoing and received PRN Excedrin for this. Support and encouragement provided to the patient. Scheduled medications administered to patient per physician's orders. Patient is cooperative but minimal. He is seen in the dayroom this morning but shortly after returned to his bed to sleep. Q15 minute checks are maintained for safety.

## 2015-11-24 NOTE — Progress Notes (Signed)
Patient did attend the evening speaker AA meeting.  

## 2015-11-24 NOTE — [facility] Group Notes (Signed)
[facility] Group Notes: (Clinical Social Work)   11/24/2015      Type of Therapy:  Group Therapy   Participation Level:  Did Not Attend despite MHT prompting   Selmer Dominion, LCSW 11/24/2015, 12:25 PM

## 2015-11-24 NOTE — [facility] Group Notes (Signed)
[facility] Group Notes:  (Nursing/MHT/Case Management/Adjunct)  Date:  11/24/2015  Time:  10:29 AM  Type of Therapy:  Psychoeducational Skills  Participation Level:  Did Not Attend  Participation Quality:  N/A  Affect:  N/A  Cognitive:  N/A  Insight:  None  Engagement in Group:  None  Modes of Intervention:  Discussion and Education  Summary of Progress/Problems: Patient was invited but did not attend.   Gaylan Gerold E 11/24/2015, 10:29 AM

## 2015-11-25 MED ORDER — BACITRACIN-NEOMYCIN-POLYMYXIN OINTMENT TUBE
TOPICAL_OINTMENT | CUTANEOUS | Status: DC | PRN
  Filled 2015-11-25: qty 15

## 2015-11-25 NOTE — [facility] Group Notes (Signed)
Adult Psychoeducational Group Note  Date:  11/25/2015 Time:  9:26 PM  Group Topic/Focus:  Wrap-Up Group:   The focus of this group is to help patients review their daily goal of treatment and discuss progress on daily workbooks.  Participation Level:  Minimal  Participation Quality:  Attentive  Affect:  Flat  Cognitive:  Appropriate  Insight: Good  Engagement in Group:  Limited  Modes of Intervention:  Discussion  Additional Comments:  Pt rated his day a 5.  His goal was to get rid of his headaches.  He expressed that his medication is helping with the headaches.  He doesn't have any discharge plans yet.  Victorino Sparrow A 11/25/2015, 9:26 PM

## 2015-11-25 NOTE — Progress Notes (Signed)
Pt met with T[facility] from [facility] for 2nd assessment. Pt is scheduled to meet with [facility] again tomorrow and he plans to follow-up with this program at d/c.    Smart, MSW, LCSW Clinical Social Worker 11/25/2015 11:30 AM   

## 2015-11-25 NOTE — Progress Notes (Signed)
Pt has been visible on the unit and went to the [place] meeting tonight.  He denies SI/HI/AVH.  He voiced no needs or concerns tonight.  He changed out his colostomy bag tonight.  Conversation was minimal.  He was encouraged to make his needs known to staff.  Discharge plans are in process.  Support and encouragement offered.  Safety maintained with q15 minute checks.

## 2015-11-25 NOTE — Progress Notes (Signed)
Patient ID: Richard [name], male   DOB: 1965/12/28, 50 y.o.   MRN: [id number]  DAR: Pt. Denies SI/HI and A/V Hallucinations. He reports sleep is fair, appetite is fair, energy level is normal, and concentration is good. He rates depression, anxiety, and hopelessness 8/10. Patient does not report any pain but does report sore on his lip which he reports that he believes it is an ingrown hair. Patient's BP is elevated however patient asymptomatic. Support and encouragement provided to the patient. Scheduled medications administered to patient per physician's orders. Patient is cooperative but minimal with this Probation officer. Q15 minute checks are maintained for safety.

## 2015-11-25 NOTE — Plan of Care (Signed)
Problem: Diagnosis: Increased Risk For Suicide Attempt Goal: STG-Patient Will Comply With Medication Regime Outcome: Progressing Richard [name] is compliant with medication regimen

## 2015-11-25 NOTE — Progress Notes (Signed)
[facility] Therapy Notes  Date: 03.13.2017 Time: 9:30am  Location: [address] Group Room   Group Topic: Stress Management  Goal Area(s) Addresses:  Patient will actively participate in stress management techniques presented during session.   Behavioral Response: Did not attend.   Laureen Ochs AmeLie Hollars, LRT/CTRS        Lane Hacker 11/25/2015 1:43 PM

## 2015-11-25 NOTE — B[facility] Group Notes (Signed)
[place] LCSW Group Therapy  11/25/2015 3:10 PM  Type of Therapy:  Group Therapy  Participation Level:  Did Not Attend -pt invited. Chose to remain in room to rest  Modes of Intervention:  Confrontation, Discussion, Education, Exploration, Problem-solving, Rapport Building, Socialization and Support  Summary of Progress/Problems: Today's Topic: Overcoming Obstacles. Patients identified one short term goal and potential obstacles in reaching this goal. Patients processed barriers involved in overcoming these obstacles. Patients identified steps necessary for overcoming these obstacles and explored motivation (internal and external) for facing these difficulties head on.   Smart, Lanissa Cashen LCSW 11/25/2015, 3:10 PM

## 2015-11-25 NOTE — Progress Notes (Signed)
[facility] MD Progress Note  11/25/2015 3:47 PM Richard [name]  MRN:  [id number] Subjective:  Zan states he is starting to feel better. He feels the medication he is taking is helping "his head" . He is still hearing some voices but has seen a decrease in frequency and intensity. He would like to [facility] with the [facility] to get a bed. Wants to pursue outpatient treatment if he cant go to a residential treatment program Principal Problem: Severe episode of recurrent major depressive disorder, with psychotic features ([place]) Diagnosis:   Patient Active Problem List   Diagnosis Date Noted  . Severe episode of recurrent major depressive disorder, with psychotic features ([place]) [F33.3]   . MDD (major depressive disorder) ([place]) [F32.9] 11/16/2015  . Severe episode of recurrent major depressive disorder, without psychotic features ([place]) [F33.2]   . MDD (major depressive disorder), recurrent episode, severe ([place]) [F33.2] 10/17/2015  . Cocaine abuse [F14.10] 10/17/2015  . Alcohol abuse [F10.10] 10/17/2015  . Severe single current episode of major depressive disorder, with psychotic features ([place]) [F32.3]   . Substance induced mood disorder ([place]) [F19.94] 10/08/2015  . 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 Colon cancer s/p chemo + radiation and resection. Now with colostomy bag [C18.9] 04/10/2015  . SVT (supraventricular tachycardia) ([place]) [I47.1] 04/10/2015  . Tobacco abuse [Z72.0] 04/10/2015   Total Time spent with patient: 20 minutes  Past Psychiatric History: see admission H and P  Past Medical History:  Past Medical History  Diagnosis Date  . Colon cancer ([place])   . Colon cancer ([place]) 2015  . Colostomy complication ([place]) 73-53-29    blood in colostomy bag    Past Surgical History  Procedure Laterality Date  . Partial colectomy  06/2014    done at [facility]   Family History:  Family History  Problem Relation Age of Onset  . Coronary artery disease Cousin 18   Family Psychiatric  History: see  admission H and P Social History:  History  Alcohol Use  . 0.6 oz/week  . 1 Cans of beer per week    Comment: drinks 2 times a week     History  Drug Use  . Yes  . Special: Marijuana, Cocaine    Comment: Lst used 3 days ago    Social History   Social History  . Marital Status: Single    Spouse Name: N/A  . Number of Children: N/A  . Years of Education: N/A   Social History Main Topics  . Smoking status: Current Every Day Smoker -- 0.50 packs/day    Types: Cigarettes  . Smokeless tobacco: Never Used  . Alcohol Use: 0.6 oz/week    1 Cans of beer per week     Comment: drinks 2 times a week  . Drug Use: Yes    Special: Marijuana, Cocaine     Comment: Lst used 3 days ago  . Sexual Activity: No   Other Topics Concern  . None   Social History Narrative   Additional Social History:                         Sleep: Fair  Appetite:  Fair  Current Medications: Current Facility-Administered Medications  Medication Dose Route Frequency Provider Last Rate Last Dose  . acetaminophen (TYLENOL) tablet 650 mg  650 mg Oral Q6H PRN Jenne Campus, MD   650 mg at 11/19/15 0737  . alum & mag hydroxide-simeth (MAALOX/MYLANTA) 200-200-20 MG/5ML suspension 30 mL  30 mL Oral Q4H PRN Derrill Center, NP      . ARIPiprazole (ABILIFY) tablet 20 mg  20 mg Oral Daily Jenne Campus, MD   20 mg at 11/25/15 0802  . cyclobenzaprine (FLEXERIL) tablet 5 mg  5 mg Oral TID PRN Nicholaus Bloom, MD   5 mg at 11/18/15 1300  . FLUoxetine (PROZAC) capsule 20 mg  20 mg Oral Daily Nicholaus Bloom, MD   20 mg at 11/25/15 0802  . hydrOXYzine (ATARAX/VISTARIL) tablet 25 mg  25 mg Oral Q6H PRN Jenne Campus, MD   25 mg at 11/18/15 0748  . magnesium hydroxide (MILK OF MAGNESIA) suspension 30 mL  30 mL Oral Daily PRN Derrill Center, NP      . neomycin-bacitracin-polymyxin (NEOSPORIN) ointment   Topical PRN Nicholaus Bloom, MD      . topiramate (TOPAMAX) tablet 50 mg  50 mg Oral Daily Derrill Center, NP    50 mg at 11/25/15 0802  . traZODone (DESYREL) tablet 100 mg  100 mg Oral QHS Jenne Campus, MD   100 mg at 11/24/15 2133    Lab Results: No results found for this or any previous visit (from the past 66 hour(s)).  Blood Alcohol level:  Lab Results  Component Value Date   ETH <5 11/15/2015   ETH <5 10/07/2015    Physical Findings: AIMS: Facial and Oral Movements Muscles of Facial Expression: None, normal Lips and Perioral Area: None, normal Jaw: None, normal Tongue: None, normal,Extremity Movements Upper (arms, wrists, hands, fingers): None, normal Lower (legs, knees, ankles, toes): None, normal, Trunk Movements Neck, shoulders, hips: None, normal, Overall Severity Severity of abnormal movements (highest score from questions above): None, normal Incapacitation due to abnormal movements: None, normal Patient's awareness of abnormal movements (rate only patient's report): No Awareness, Dental Status Current problems with teeth and/or dentures?: No Does patient usually wear dentures?: No  CIWA:  CIWA-Ar Total: 1 COWS:  COWS Total Score: 1  Musculoskeletal: Strength & Muscle Tone: within normal limits Gait & Station: normal Patient leans: normal  Psychiatric Specialty Exam: Review of Systems  Constitutional: Negative.   HENT: Negative.   Eyes: Negative.   Respiratory: Negative.   Cardiovascular: Negative.   Gastrointestinal: Negative.        Colonostomy  Genitourinary: Negative.   Musculoskeletal: Negative.   Skin: Negative.   Neurological: Negative.   Endo/Heme/Allergies: Negative.   Psychiatric/Behavioral: Positive for substance abuse. The patient is nervous/anxious.     Blood pressure 118/79, pulse 59, temperature 98.4 F (36.9 C), temperature source Oral, resp. rate 18, height 5' 6.75" (1.695 m), weight 113.853 kg (251 lb).Body mass index is 39.63 kg/(m^2).  General Appearance: Fairly Groomed  Engineer, water::  Fair  Speech:  Clear and Coherent  Volume:   Normal  Mood:  Anxious and worried  Affect:  Restricted  Thought Process:  Coherent and Goal Directed  Orientation:  Full (Time, Place, and Person)  Thought Content:  symptoms events worries concerns  Suicidal Thoughts:  No  Homicidal Thoughts:  No  Memory:  Immediate;   Fair Recent;   Fair Remote;   Fair  Judgement:  Fair  Insight:  Present  Psychomotor Activity:  Decreased  Concentration:  Fair  Recall:  [employer] of Knowledge:Fair  Language: Fair  Akathisia:  No  Handed:  Right  AIMS (if indicated):     Assets:  Desire for Improvement  ADL's:  Intact  Cognition: WNL  Sleep:  Number of Hours: 6.5   Treatment Plan Summary: Daily contact with patient to assess and evaluate symptoms and progress in treatment and Medication management Supportive approach/coping skills Substance abuse; work a relapse prevention plan Depression; continue Prozac 20 mg daily Hallucinations; continue the Abilify 20 mg daily HA; continue the Topamax 50 mg daily Work with CBT/mindfulness Daquane Aguilar A, MD 11/25/2015, 3:47 PM

## 2015-11-25 NOTE — Progress Notes (Signed)
D:Patient in the dayroom on approach.  Patient states he had a good day. Patient states  Patient states he still is unsure of what he will do after discharge.  Patient still appears flat and depressed. Patient denies SI/HI and denies AVH.   A: Staff to monitor Q 15 mins for safety.  Encouragement and support offered.  Scheduled medications administered per orders.   R: Patient remains safe on the unit.  Patient attended group tonight.  Patient visible on the unit.  Patient taking administered medications.  Patient taking administered medications.

## 2015-11-25 NOTE — B[facility] Group Notes (Signed)
[facility] LCSW Aftercare Discharge Planning Group Note   11/25/2015 11:30 AM  Participation Quality:  Invited. Chose to remain in bed. DID NOT ATTEND.   Smart, Breton Berns LCSW

## 2015-11-26 MED ORDER — FLUOXETINE HCL 20 MG PO CAPS: 20.0000 mg | ORAL_CAPSULE | Freq: Every day | ORAL | Status: DC

## 2015-11-26 MED ORDER — TOPIRAMATE 50 MG PO TABS: 50.0000 mg | ORAL_TABLET | Freq: Every day | ORAL | Status: DC

## 2015-11-26 MED ORDER — BACITRACIN-NEOMYCIN-POLYMYXIN OINTMENT TUBE: 1.0000 "application " | TOPICAL_OINTMENT | CUTANEOUS | Status: DC | PRN

## 2015-11-26 MED ORDER — TRAZODONE HCL 100 MG PO TABS: 100.0000 mg | ORAL_TABLET | Freq: Every day | ORAL | Status: DC

## 2015-11-26 MED ORDER — ARIPIPRAZOLE 20 MG PO TABS: 20.0000 mg | ORAL_TABLET | Freq: Every day | ORAL | Status: DC

## 2015-11-26 MED ORDER — HYDROXYZINE HCL 25 MG PO TABS: 25.0000 mg | ORAL_TABLET | Freq: Four times a day (QID) | ORAL | Status: DC | PRN

## 2015-11-26 NOTE — Discharge Summary (Signed)
Physician Discharge Summary Note  Patient:  Richard [name] is an 50 y.o., male  MRN:  [id number]  DOB:  1965-10-04  Patient phone:  [phone] (home)   Patient address:   [address] [place] [address],   Total Time spent with patient: Greater than 30 minutes  Date of Admission:  11/16/2015  Date of Discharge: 11-26-15  Reason for Admission: Worsening symptoms of depression/suicidal ideations  Principal Problem: Severe episode of recurrent major depressive disorder, with psychotic features [facility])  Discharge Diagnoses: Patient Active Problem List   Diagnosis Date Noted  . Severe episode of recurrent major depressive disorder, with psychotic features ([place]) [F33.3]   . MDD (major depressive disorder) ([place]) [F32.9] 11/16/2015  . Severe episode of recurrent major depressive disorder, without psychotic features ([place]) [F33.2]   . MDD (major depressive disorder), recurrent episode, severe ([place]) [F33.2] 10/17/2015  . Cocaine abuse [F14.10] 10/17/2015  . Alcohol abuse [F10.10] 10/17/2015  . Severe single current episode of major depressive disorder, with psychotic features ([place]) [F32.3]   . Substance induced mood disorder ([place]) [F19.94] 10/08/2015  . 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 Colon cancer s/p chemo + radiation and resection. Now with colostomy bag [C18.9] 04/10/2015  . SVT (supraventricular tachycardia) ([place]) [I47.1] 04/10/2015  . Tobacco abuse [Z72.0] 04/10/2015   Past Psychiatric History: Major depression  Past Medical History:  Past Medical History  Diagnosis Date  . Colon cancer ([place])   . Colon cancer ([place]) 2015  . Colostomy complication ([place]) 65-78-46    blood in colostomy bag    Past Surgical History  Procedure Laterality Date  . Partial colectomy  06/2014    done at [facility]   Family History:  Family History  Problem Relation Age of Onset  . Coronary artery disease Cousin 85   Family Psychiatric  History: See H&P  Social History:  History   Alcohol Use  . 0.6 oz/week  . 1 Cans of beer per week    Comment: drinks 2 times a week     History  Drug Use  . Yes  . Special: Marijuana, Cocaine    Comment: Lst used 3 days ago    Social History   Social History  . Marital Status: Single    Spouse Name: N/A  . Number of Children: N/A  . Years of Education: N/A   Social History Main Topics  . Smoking status: Current Every Day Smoker -- 0.50 packs/day    Types: Cigarettes  . Smokeless tobacco: Never Used  . Alcohol Use: 0.6 oz/week    1 Cans of beer per week     Comment: drinks 2 times a week  . Drug Use: Yes    Special: Marijuana, Cocaine     Comment: Lst used 3 days ago  . Sexual Activity: No   Other Topics Concern  . None   Social History Narrative   Hospital Course: Richard [name] is an 50 y.o. male. Patient was brought into the ED by EMS because of issues with his colostomy and suicidal ideation with psychosis. Patient continues to endorse suicidal ideations with plan and command hallucinations. Patient reports drinking a gallon liquor daily and last drinking one day ago. Patient was admitted twice in the last 60 days at [facility] for similar symptoms.   Richard [name] was readmitted to the [facility] adult unit with his UDS test reports showing positive Cocaine & THC. Although, claimed he drank a gallon of liquor prior to coming to the hospital ED,  his BAL was <5 on arrival to the ED. He presented as his reason for readmission as; suicidal ideations with plans & auditory hallucinations. Again, Richard [name] was restarted on medication regimen for his presenting symptoms with some dose adjustments made. He was medicated & discharged on;  Abilify20 mg daily for mood control/adjunct treatment for depression, Prozac 20 mg daily for depression, Hydroxyzine 25 mg for anxiety, Topamax 50 mg for mood stabilization & Trazodone 100 mg daily for sleep. He was re-enrolled & participated in the group counseling sessions being offered and held on this unit, he  learned coping skills that should help him cope better & maintain mood stability after discharge. He presented no other significant pre-existing health issues that required treatment and or monitoring. He self-managed  his colostomy. He tolerated his treatment regimen without any significant adverse effects & or reactions.  Richard [name]'s symptoms were evaluated on daily basis by a clinical provider to assure his symptoms are responding to his treatment regimen & they were. This is evidenced by his reports of decreasing symptoms, improved mood, absence of SIHI, AVH & presentation of good affect.  It is also quite obvious that Richard [name] has a significant substance abuse problems & will benefit from further substance abuse treatment after discharge. So, he was encouraged to join & participate in the AA/NA meetings being offered & held within his community. He will follow-up mental health care & medication management as noted below.  He is provided with all the pertinent information required to make this appointment without problems.   On this day of his hospital discharge, Richard [name] is in much improved condition than upon admission. He contracted for his safety & felt more in control of his mood. His symptoms were reported as significantly decreased or resolved completely. He denies any SIHI & voiced no AVH. He is instructed & motivated to continue taking medications with a goal of continued improvement in mental health. Richard [name] was provided with a 7 days worth supply samples of his [facility] discharge medications. He left [facility] in no apparent distress with all belongings.Transportation per city bus. [place] assisted with bus pass.  Physical Findings:  AIMS: Facial and Oral Movements Muscles of Facial Expression: None, normal Lips and Perioral Area: None, normal Jaw: None, normal Tongue: None, normal,Extremity Movements Upper (arms, wrists, hands, fingers): None, normal Lower (legs, knees, ankles, toes): None, normal, Trunk  Movements Neck, shoulders, hips: None, normal, Overall Severity Severity of abnormal movements (highest score from questions above): None, normal Incapacitation due to abnormal movements: None, normal Patient's awareness of abnormal movements (rate only patient's report): No Awareness, Dental Status Current problems with teeth and/or dentures?: No Does patient usually wear dentures?: No  CIWA:  CIWA-Ar Total: 1 COWS:  COWS Total Score: 1  Musculoskeletal: Strength & Muscle Tone: within normal limits Gait & Station: normal Patient leans: N/A  Psychiatric Specialty Exam: Review of Systems  Constitutional: Negative.   HENT: Negative.   Eyes: Negative.   Respiratory: Negative.   Cardiovascular: Negative.   Gastrointestinal: Negative.   Genitourinary: Negative.   Musculoskeletal: Negative.   Skin: Negative.   Neurological: Negative.   Endo/Heme/Allergies: Negative.   Psychiatric/Behavioral: Positive for depression (Stable), hallucinations (Hx or (auditory)) and substance abuse (Hx Alcohol, cocaine, tobacco THC abuse). Negative for suicidal ideas and memory loss. The patient has insomnia (Stable). The patient is not nervous/anxious.     Blood pressure 121/94, pulse 70, temperature 98.4 F (36.9 C), temperature source Oral, resp. rate 18, height 5' 6.75" (1.695 m), weight [phone]  kg (251 lb).Body mass index is 39.63 kg/(m^2).  See Md's SRA   Have you used any form of tobacco in the last 30 days? (Cigarettes, Smokeless Tobacco, Cigars, and/or Pipes): Yes  Has this patient used any form of tobacco in the last 30 days? (Cigarettes, Smokeless Tobacco, Cigars, and/or Pipes): Yes, A prescription for an FDA-approved tobacco cessation medication was offered at discharge and the patient refused  Metabolic Disorder Labs:  Lab Results  Component Value Date   HGBA1C 5.9* 11/17/2015   MPG 123 11/17/2015   Lab Results  Component Value Date   PROLACTIN 9.4 11/17/2015   Lab Results   Component Value Date   CHOL 184 11/17/2015   TRIG 187* 11/17/2015   HDL 36* 11/17/2015   CHOLHDL 5.1 11/17/2015   VLDL 37 11/17/2015   LDLCALC 111* 11/17/2015    See Psychiatric Specialty Exam and Suicide Risk Assessment completed by Attending Physician prior to discharge.  Discharge destination:  Home  Is patient on multiple antipsychotic therapies at discharge:  No   Has Patient had three or more failed trials of antipsychotic monotherapy by history:  No  Recommended Plan for Multiple Antipsychotic Therapies: NA    Medication List    TAKE these medications      Indication   ARIPiprazole 20 MG tablet  Commonly known as:  ABILIFY  Take 1 tablet (20 mg total) by mouth daily. For mood control   Indication:  Mood control     FLUoxetine 20 MG capsule  Commonly known as:  PROZAC  Take 1 capsule (20 mg total) by mouth daily. For depression   Indication:  Major Depressive Disorder     hydrOXYzine 25 MG tablet  Commonly known as:  ATARAX/VISTARIL  Take 1 tablet (25 mg total) by mouth every 6 (six) hours as needed for anxiety.   Indication:  Anxiety     neomycin-bacitracin-polymyxin Oint  Commonly known as:  NEOSPORIN  Apply 1 application topically as needed for wound care.   Indication:  Wound care     topiramate 50 MG tablet  Commonly known as:  TOPAMAX  Take 1 tablet (50 mg total) by mouth daily. For mood stabilization   Indication:  Mood stabilization     traZODone 100 MG tablet  Commonly known as:  DESYREL  Take 1 tablet (100 mg total) by mouth at bedtime. For sleep   Indication:  Trouble Sleeping       Follow-up Information    Follow up with [facility].   Why:  Walk in between 8am-9am Monday through Friday for hospital follow-up/medication management/assessment for counseling services. Please request assessment for ACT services due to high number of recent hospitalizations.    Contact information:   [address] [place] [address] Phone:  [phone] Fax: [phone]     Follow-up recommendations: Activity:  As tolerated Diet: As recommended by your primary care doctor. Keep all scheduled follow-up appointments as recommended.    Comments: Take all your medications as prescribed by your mental healthcare provider. Report any adverse effects and or reactions from your medicines to your outpatient provider promptly. Patient is instructed and cautioned to not engage in alcohol and or illegal drug use while on prescription medicines. In the event of worsening symptoms, patient is instructed to call the crisis hotline, 911 and or go to the nearest ED for appropriate evaluation and treatment of symptoms. Follow-up with your primary care provider for your other medical issues, concerns and or health care needs.  Signed: Encarnacion Slates, NP, PMHNP, FNP-BC 11/27/2015, 10:37 AM   I personally assessed the patient and formulated the plan Geralyn Flash A. Sabra Heck, M.D.

## 2015-11-26 NOTE — [facility] Suicide Risk Assessment (Signed)
[name] Discharge Suicide Risk Assessment   Principal Problem: Severe episode of recurrent major depressive disorder, with psychotic features [facility]) Discharge Diagnoses:  Patient Active Problem List   Diagnosis Date Noted  . Severe episode of recurrent major depressive disorder, with psychotic features ([place]) [F33.3]   . MDD (major depressive disorder) ([place]) [F32.9] 11/16/2015  . Severe episode of recurrent major depressive disorder, without psychotic features ([place]) [F33.2]   . MDD (major depressive disorder), recurrent episode, severe ([place]) [F33.2] 10/17/2015  . Cocaine abuse [F14.10] 10/17/2015  . Alcohol abuse [F10.10] 10/17/2015  . Severe single current episode of major depressive disorder, with psychotic features ([place]) [F32.3]   . Substance induced mood disorder ([place]) [F19.94] 10/08/2015  . Colostomy complication ([place]) [id number]   . Colon cancer s/p chemo + radiation and resection. Now with colostomy bag [C18.9] 04/10/2015  . SVT (supraventricular tachycardia) ([place]) [I47.1] 04/10/2015  . Tobacco abuse [Z72.0] 04/10/2015    Total Time spent with patient: 15 minutes  Musculoskeletal: Strength & Muscle Tone: within normal limits Gait & Station: normal Patient leans: normal  Psychiatric Specialty Exam: Review of Systems  Constitutional: Negative.   HENT: Negative.   Eyes: Negative.   Respiratory: Negative.   Cardiovascular: Negative.   Gastrointestinal: Negative.        Colonostomy  Genitourinary: Negative.   Musculoskeletal: Negative.   Skin: Negative.   Neurological: Negative.   Endo/Heme/Allergies: Negative.   Psychiatric/Behavioral: Positive for depression and substance abuse.    Blood pressure 121/94, pulse 70, temperature 98.4 F (36.9 C), temperature source Oral, resp. rate 18, height 5' 6.75" (1.695 m), weight 113.853 kg (251 lb).Body mass index is 39.63 kg/(m^2).  General Appearance: Fairly Groomed  Engineer, water::  Fair  Speech:  Clear and [id number]  Volume:   Normal  Mood:  Euthymic  Affect:  Restricted  Thought Process:  Coherent and Goal Directed  Orientation:  Full (Time, Place, and Person)  Thought Content:  plans as he moves on, relapse prevention plan  Suicidal Thoughts:  No  Homicidal Thoughts:  No  Memory:  Immediate;   Fair Recent;   Fair Remote;   Fair  Judgement:  Fair  Insight:  Present  Psychomotor Activity:  Normal  Concentration:  Fair  Recall:  [employer] of Knowledge:Fair  Language: Fair  Akathisia:  No  Handed:  Right  AIMS (if indicated):     Assets:  Desire for Improvement  Sleep:  Number of Hours: 6.75  Cognition: WNL  ADL's:  Intact  In full contact with reality. There are no active S/S of withdrawal. There are no active SI plans or intent. He is going to work with the [facility] to get a shelter bed and then more stable living arrangements. He states he is committed to abstinence Mental Status Per Nursing Assessment::   On Admission:  Suicidal ideation indicated by patient  Demographic Factors:  Male  Loss Factors: Decline in physical health  Historical Factors: none identified  Risk Reduction Factors:   Sense of responsibility to family  Continued Clinical Symptoms:  Alcohol/Substance Abuse/Dependencies  Cognitive Features That Contribute To Risk:  None    Suicide Risk:  Minimal: No identifiable suicidal ideation.  Patients presenting with no risk factors but with morbid ruminations; may be classified as minimal risk based on the severity of the depressive symptoms  Follow-up Information    Follow up with [facility].   Why:  Walk in between 8am-9am Monday through Friday for hospital follow-up/medication management/assessment for counseling services. Please request  assessment for ACT services due to high number of recent hospitalizations.    Contact information:   [address], [place] [address] Phone: [phone] Fax: [phone]      Plan Of Care/Follow-up recommendations:  Activity:   as tolerated Diet:  regular Follow up as above Kolbee Stallman A, MD 11/26/2015, 12:26 PM

## 2015-11-26 NOTE — Progress Notes (Signed)
Patient ID: Richard [name], male   DOB: March 18, 1966, 50 y.o.   MRN: [id number]  Pt currently presents with a flat affect and cooperative behavior. Per self inventory, pt rates depression at a 7, hopelessness 7 and anxiety 7. Pt's daily goal is "staying well" and they intend to do so by "makeplan." Pt reports fair sleep, a fair appetite, normal energy and good concentration.   Pt provided with medications per providers orders. Pt's labs and vitals were monitored throughout the day. Pt supported emotionally and encouraged to express concerns and questions. Pt educated on medications and suicide prevention resources.   Pt's safety ensured with 15 minute and environmental checks. Pt currently denies SI/HI and A/V hallucinations. Pt verbally agrees to seek staff if SI/HI or A/VH occurs and to consult with staff before acting on these thoughts. Pt to be discharged per MD. Pt awaiting visit from [facility] today, plans to seek housing post discharge. Will continue POC.

## 2015-11-26 NOTE — Progress Notes (Signed)
Patient ID: Richard [name], male   DOB: 07/29/1966, 50 y.o.   MRN: [id number]   Pt discharged to [facility] with two bus passes. Pt was stable and appreciative at that time. All papers and prescriptions were given and valuables returned. Verbal understanding expressed. Denies SI/HI and A/VH. Pt given opportunity to express concerns and ask questions.

## 2015-11-26 NOTE — Progress Notes (Signed)
Patient ID: Travares Blanken, male   DOB: [birth year], 50 y.o.   MRN: [id number] Adult Psychoeducational Group Note  Date:  11/26/2015 Time:  09:00am  Group Topic/Focus:  Self Care:   The focus of this group is to help patients understand the importance of self-care in order to improve or restore emotional, physical, spiritual, interpersonal, and financial health.  Participation Level:  Active  Participation Quality:  Appropriate  Affect:  Flat  Cognitive:  Alert and Oriented  Insight: Improving  Engagement in Group:  Engaged  Modes of Intervention:  Activity, Discussion, Education and Support  Additional Comments:  Pt able to identify spiritual self care as an area to develop when he is discharged. Pt reports "I would like to get back into church, I pray but I think going to church would really be better."  Elenore Rota 11/26/2015, 9:56 AM

## 2015-11-26 NOTE — Progress Notes (Signed)
  [facility] Adult Case Management Discharge Plan :  Will you be returning to the same living situation after discharge:  No.Pt plans to go to [facility] program/[facility] at discharge for shelter placement.  At discharge, do you have transportation home?: Yes,  bus pass Do you have the ability to pay for your medications: Yes,  [place] medicaid  Release of information consent forms completed and submitted to medical records by CSW today.   Patient to Follow up at: Follow-up Information    Follow up with [facility].   Why:  Walk in between 8am-9am Monday through Friday for hospital follow-up/medication management/assessment for counseling services. Please request assessment for ACT services due to high number of recent hospitalizations.    Contact information:   [address], [place] [address] Phone: [phone] Fax: [phone]      Next level of care provider has access to [place] and Suicide Prevention discussed: Yes,  SPE completed with pt, as he declined to consent to family contact. SPI pamphlet and mobile crisis information provided to pt.   Have you used any form of tobacco in the last 30 days? (Cigarettes, Smokeless Tobacco, Cigars, and/or Pipes): Yes  Has patient been referred to the Quitline?: Patient refused referral  Patient has been referred for addiction treatment: Yes  Smart, Kailand Seda LCSW 11/26/2015, 9:53 AM

## 2015-11-26 NOTE — Tx Team (Signed)
Interdisciplinary Treatment Plan Update (Adult)  Date:  11/26/2015  Time Reviewed:  9:54 AM   Progress in Treatment: Attending groups: Intermittently  Participating in groups:  Yes, when he attends  Taking medication as prescribed:  Yes. Tolerating medication:  Yes. Family/Significant othe contact made:  SPE completed with pt, as he declined to consent to family contact.  Patient understands diagnosis:  Yes. and As evidenced by:  seeking treatment for psychosis, SI, depression, alcohol abuse. Discussing patient identified problems/goals with staff:  Yes. Medical problems stabilized or resolved:  Yes. Denies suicidal/homicidal ideation: Yes. Issues/concerns per patient self-inventory:  Other:  Discharge Plan or Barriers: Pt met with [facility] and [facility] program staff through the [facility] to discuss potential placement options. Pt referred to [place] but is planning to d/c to [facility] program through [facility] for shelter placement. Pt instructed to follow-up at [facility] and be assessed for ACT services--[place] Medicaid--pt given information about how to get medicaid transferred to [facility] if he plans to reside here.   Reason for Continuation of Hospitalization: None  Comments:  Richard [name] is an 50 y.o. male. Patient was brought into the ED by EMS because of issues with his colostomy and suicidal ideation with psychosis. Patient continues to endorse suicidal ideations with plan and command hallucinations. Patient reports drinking a gallon liquor daily and last drinking one day ago. Patient was admitted twice in the last 60 days at [facility] for similar symptoms. Diagnosis: Substance Induced Mood Disorder  Estimated length of stay:  D/c today   Additional Comments:  Patient and CSW reviewed pt's identified goals and treatment plan. Patient verbalized understanding and agreed to treatment plan. CSW reviewed [facility] "Discharge Process and Patient Involvement" Form. Pt verbalized understanding  of information provided and signed form.    Review of initial/current patient goals per problem list:  1. Goal(s): Patient will participate in aftercare plan  Met: Yes.   Target date: at discharge  As evidenced by: Patient will participate within aftercare plan AEB aftercare provider and housing plan at discharge being identified.  3/6: CSW assessing for appropriate referrals.   3/10: Pt referred to [place]; [facility], technical sales and given [employer]. He also met with [facility] program. Pt plans to d/c to [facility] for shelter placement; [facility] ACT referral.   2. Goal (s): Patient will exhibit decreased depressive symptoms and suicidal ideations.  Met: Yes    Target date: at discharge  As evidenced by: Patient will utilize self rating of depression at 3 or below and demonstrate decreased signs of depression or be deemed stable for discharge by MD.  3/6: Pt rates depression as high. Denies SI/HI/AVH.   3/10: Pt rates depression as 8/10 and presents with depressed mood/lethargic affect. He continues to endorse AH-mumbling.   3/14: Pt rates depression as 3/10 and presents with pleasant mood/calm affect. He denies SI/HI/AVH today.   3. Goal(s): Patient will demonstrate decreased signs of withdrawal due to substance abuse  Met:Yes   Target date:at discharge   As evidenced by: Patient will produce a CIWA/COWS score of 0, have stable vitals signs, and no symptoms of withdrawal.  3/6: Pt reports mild withdrawals with CIWA of 2 and stable vitals this morning.   3/10: Pt reports no signs of withdrawal with CIWA/COWS of 0 and stable vitals.   Attendees: Patient:   11/26/2015 9:54 AM   Family:   11/26/2015 9:54 AM   Physician:  Dr. Carlton Adam MD  11/26/2015 9:54 AM   Nursing:  Beatriz Chancellor RN 11/26/2015 9:54 AM   Clinical Social Worker: Maxie Better, LCSW 11/26/2015 9:54 AM   Clinical Social Worker: Erasmo Downer Drinkard LCSW; Peri Maris LCSWA 11/26/2015 9:54 AM   Other:  Gerline Legacy  Nurse Case Manager 11/26/2015 9:54 AM   Other:  Agustina Caroli NP; May Augustin RN 11/26/2015 9:54 AM   Other:   11/26/2015 9:54 AM   Other:  11/26/2015 9:54 AM   Other:  11/26/2015 9:54 AM   Other:  11/26/2015 9:54 AM    11/26/2015 9:54 AM    11/26/2015 9:54 AM    11/26/2015 9:54 AM    11/26/2015 9:54 AM    Scribe for Treatment Team:   Maxie Better, LCSW 11/26/2015 9:54 AM

## 2015-11-28 ENCOUNTER — Encounter (HOSPITAL_COMMUNITY): Payer: Self-pay | Admitting: Emergency Medicine

## 2015-11-28 ENCOUNTER — Emergency Department (HOSPITAL_COMMUNITY)
Admission: EM | Admit: 2015-11-28 | Discharge: 2015-11-30 | Disposition: A | Discharge status: Home / Self Care | Payer: Medicaid Other | Attending: Emergency Medicine | Admitting: Emergency Medicine

## 2015-11-28 DIAGNOSIS — F332 Major depressive disorder, recurrent severe without psychotic features: Secondary | ICD-10-CM | POA: Diagnosis not present

## 2015-11-28 DIAGNOSIS — F333 Major depressive disorder, recurrent, severe with psychotic symptoms: Secondary | ICD-10-CM | POA: Insufficient documentation

## 2015-11-28 DIAGNOSIS — F1410 Cocaine abuse, uncomplicated: Secondary | ICD-10-CM | POA: Insufficient documentation

## 2015-11-28 DIAGNOSIS — Z79899 Other long term (current) drug therapy: Secondary | ICD-10-CM | POA: Insufficient documentation

## 2015-11-28 DIAGNOSIS — F17210 Nicotine dependence, cigarettes, uncomplicated: Secondary | ICD-10-CM | POA: Diagnosis not present

## 2015-11-28 DIAGNOSIS — R0789 Other chest pain: Secondary | ICD-10-CM | POA: Diagnosis not present

## 2015-11-28 DIAGNOSIS — F329 Major depressive disorder, single episode, unspecified: Secondary | ICD-10-CM | POA: Diagnosis present

## 2015-11-28 DIAGNOSIS — Z85038 Personal history of other malignant neoplasm of large intestine: Secondary | ICD-10-CM | POA: Diagnosis not present

## 2015-11-28 DIAGNOSIS — R45851 Suicidal ideations: Secondary | ICD-10-CM | POA: Diagnosis present

## 2015-11-28 DIAGNOSIS — Z933 Colostomy status: Secondary | ICD-10-CM | POA: Diagnosis not present

## 2015-11-28 DIAGNOSIS — F339 Major depressive disorder, recurrent, unspecified: Secondary | ICD-10-CM | POA: Diagnosis not present

## 2015-11-28 LAB — CBC
HEMATOCRIT: 39 % (ref 39.0–52.0)
HEMOGLOBIN: 13 g/dL (ref 13.0–17.0)
MCH: 29.6 pg (ref 26.0–34.0)
MCHC: 33.3 g/dL (ref 30.0–36.0)
MCV: 88.8 fL (ref 78.0–100.0)
Platelets: 299 10*3/uL (ref 150–400)
RBC: 4.39 MIL/uL (ref 4.22–5.81)
RDW: 15.2 % (ref 11.5–15.5)
WBC: 4.2 10*3/uL (ref 4.0–10.5)

## 2015-11-28 LAB — COMPREHENSIVE METABOLIC PANEL
ALBUMIN: 4 g/dL (ref 3.5–5.0)
ALT: 48 U/L (ref 17–63)
ANION GAP: 9 (ref 5–15)
AST: 26 U/L (ref 15–41)
Alkaline Phosphatase: 47 U/L (ref 38–126)
BUN: 15 mg/dL (ref 6–20)
CHLORIDE: 107 mmol/L (ref 101–111)
CO2: 25 mmol/L (ref 22–32)
Calcium: 9.3 mg/dL (ref 8.9–10.3)
Creatinine, Ser: 1.27 mg/dL — ABNORMAL HIGH (ref 0.61–1.24)
GFR calc Af Amer: >60 mL/min (ref >60)
GFR calc non Af Amer: >60 mL/min (ref >60)
GLUCOSE: 97 mg/dL (ref 65–99)
POTASSIUM: 4.2 mmol/L (ref 3.5–5.1)
SODIUM: 141 mmol/L (ref 135–145)
TOTAL PROTEIN: 7.2 g/dL (ref 6.5–8.1)
Total Bilirubin: 0.5 mg/dL (ref 0.3–1.2)

## 2015-11-28 LAB — ACETAMINOPHEN LEVEL

## 2015-11-28 LAB — ETHANOL: Alcohol, Ethyl (B): <5 mg/dL (ref <5)

## 2015-11-28 LAB — RAPID URINE DRUG SCREEN, HOSP PERFORMED
Amphetamines: NOT DETECTED
BENZODIAZEPINES: NOT DETECTED
Barbiturates: NOT DETECTED
COCAINE: POSITIVE — AB
Opiates: NOT DETECTED
Tetrahydrocannabinol: NOT DETECTED

## 2015-11-28 LAB — SALICYLATE LEVEL: Salicylate Lvl: <4 mg/dL (ref 2.8–30.0)

## 2015-11-28 MED ORDER — IBUPROFEN 200 MG PO TABS: 600.0000 mg | ORAL_TABLET | Freq: Three times a day (TID) | ORAL | Status: DC | PRN

## 2015-11-28 MED ORDER — FLUOXETINE HCL 20 MG PO CAPS
20.0000 mg | ORAL_CAPSULE | Freq: Every day | ORAL | Status: DC
  Administered 2015-11-28 – 2015-11-30 (×3): 20 mg via ORAL
  Filled 2015-11-28 (×3): qty 1

## 2015-11-28 MED ORDER — TRAZODONE HCL 100 MG PO TABS
100.0000 mg | ORAL_TABLET | Freq: Every day | ORAL | Status: DC
  Administered 2015-11-28 – 2015-11-29 (×2): 100 mg via ORAL
  Filled 2015-11-28 (×3): qty 1

## 2015-11-28 MED ORDER — ONDANSETRON HCL 4 MG PO TABS: 4.0000 mg | ORAL_TABLET | Freq: Three times a day (TID) | ORAL | Status: DC | PRN

## 2015-11-28 MED ORDER — TOPIRAMATE 25 MG PO TABS
50.0000 mg | ORAL_TABLET | Freq: Every day | ORAL | Status: DC
  Administered 2015-11-28 – 2015-11-30 (×3): 50 mg via ORAL
  Filled 2015-11-28 (×3): qty 2

## 2015-11-28 MED ORDER — ACETAMINOPHEN 325 MG PO TABS: 650.0000 mg | ORAL_TABLET | ORAL | Status: DC | PRN

## 2015-11-28 MED ORDER — HYDROXYZINE HCL 25 MG PO TABS: 25.0000 mg | ORAL_TABLET | Freq: Four times a day (QID) | ORAL | Status: DC | PRN

## 2015-11-28 MED ORDER — ALUM & MAG HYDROXIDE-SIMETH 200-200-20 MG/5ML PO SUSP: 30.0000 mL | ORAL | Status: DC | PRN

## 2015-11-28 MED ORDER — ARIPIPRAZOLE 10 MG PO TABS
20.0000 mg | ORAL_TABLET | Freq: Every day | ORAL | Status: DC
  Administered 2015-11-28 – 2015-11-30 (×3): 20 mg via ORAL
  Filled 2015-11-28 (×3): qty 2

## 2015-11-28 NOTE — ED Notes (Signed)
Pt is isolative and withdrawn to room; remained in bed with eyes closed. Pt endorses severe depression; states "I am at a 10 with my depression at the moment." Pt also complained of auditory hallucination; denies command auditory hallucinations; states, "I heard the voices earlier; couldn't makeup what they were saying; they did not asked to hurt myself." Pt however denies SI, HI, and pain. Will continue to monitor for safety via security cameras and Q 15 minute checks.

## 2015-11-28 NOTE — ED Notes (Signed)
TTS at bedside. 

## 2015-11-28 NOTE — Progress Notes (Addendum)
Pt was at [facility] 11/16/15 to 11/26/15 --d/c 11/26/15 from [facility] ([address])  Pt with 8 ED visits and 3 admissions (other admissions in 10/2015 -300 & 09/2015 -400) in the last 6 months Non compliant with f/u recommendations  Entered in d/c instructions [facility] Schedule an appointment as soon as possible for a visit This is your assigned Medicaid [place] access doctor If you prefer another contact [place] assigned your doctor *You may receive a bill if you go to any family Dr not assigned to you, As needed Medicaid [place] access pcp response hx states [place] [address] [place], [place] [id number] [phone]    Medicaid [place] Access Covered Patient [place]: [place],  [address] [email or web address] Use this website to assist with understanding your coverage & to renew application As a Medicaid client you MUST contact DSS/SSI each time you change address, move to another [place] or another state to keep your address updated  [place] Medicaid Transportation to Dr appts if you are have full Medicaid: [phone], [phone]

## 2015-11-28 NOTE — Progress Notes (Signed)
Reginold Agent, NP reccommended psychiatric inpatient treatment on 3/16.  Patient has been referred to the following facilities: [facility] per Hilda Blades, will look at referral, 1 bed open. [facility] - per Aaron Edelman, accepting referrals. [facility] - left voicemail inquiring of bed status. [facility] - per [name], 1 bed left.  [facility], [facility], and [facility] are not taking adult medicaid therefore patient can not be referred at these facilities.  At capacity: [facility], [place] Disposition staff 11/28/2015 10:09 PM

## 2015-11-28 NOTE — ED Notes (Addendum)
Patient arrived with hospital bracelet in place from [facility], states he was discharged 4-5 days ago. Patient is calm and cooperative. Patient reports "having bad headaches and real bad depression, he is hearing voices with suicidal thoughts and stuff". Patient states he was placed on medication that stopped them nut today they have "started back up". Reports compliance with medication. Patient reports he is using cocaine, last used this morning

## 2015-11-28 NOTE — ED Notes (Signed)
UNABLE TO COLLECT LABS AT THIS TIME PATIENT TALKING WITH MD 

## 2015-11-28 NOTE — BH Assessment (Addendum)
Assessment Note  Richard [name] is an 50 y.o. male. Patient presents to [facility] with c/o suicidal ideation, depression, auditory hallucinations, and headaches. Patient continues to endorse suicidal ideations with no plan/no intent. He denies history of suicide attempts/gestures. Patient's suicidal thoughts are triggered by his medical issues and continous headaches. Patient sts that he was diagnosed with colon cancer 1.4 yr ago and as a result has a coloscomy bag. Patient denies family history of mental health issues. Patient denies HI. He reports auditory hallucinations stating, "I can't make out what the voices are saying" and "sounds like rubbish". Patient reports visual hallucinations of "Spots". Patient reports alcohol binges. His last alcohol binge was 3 weeks ago. Patient also reports cocaine use and last used this morning. Patient was admitted 3x's in the last 60 days to [facility] for similar symptoms. Patient follows up with [facility] for outpatient therapy.   Diagnosis: Depressive Disorder and Substance Induced Mood Disorder  Past Medical History:  Past Medical History  Diagnosis Date  . Colon cancer ([place])   . Colon cancer ([place]) 2015  . 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 Partial colectomy  06/2014    done at [facility]    Family History:  Family History  Problem Relation Age of Onset  . Coronary artery disease Cousin 70    Social History:  reports that he has been smoking Cigarettes.  He has been smoking about 0.50 packs per day. He has never used smokeless tobacco. He reports that he drinks about 0.6 oz of alcohol per week. He reports that he uses illicit drugs (Marijuana and Cocaine).  Additional Social History:  Alcohol / Drug Use Pain Medications: SEE MAR Prescriptions: SEE MAR Over the Counter: SEE MAR History of alcohol / drug use?: Yes Substance #1 Name of Substance 1: Alcohol  1 - Age of First  Use: 50 yrs old  1 - Amount (size/oz): alcohol binges 1 - Frequency: "I binge when I have $" 1 - Duration: on-going  1 - Last Use / Amount: "3 weeks ago" Substance #2 Name of Substance 2: Cocaine  2 - Age of First Use: 50 yrs old  2 - Amount (size/oz): "cocaine binges" 2 - Frequency: "I binge when I have $" 2 - Duration: on-going  2 - Last Use / Amount: "This am"  CIWA: CIWA-Ar BP: 127/98 mmHg Pulse Rate: 92 COWS:    Allergies:  Allergies  Allergen Reactions  . Morphine And Related Itching    Home Medications:  (Not in a hospital admission)  OB/GYN Status:  No LMP for male patient.  General Assessment Data Location of Assessment: [facility] TTS Assessment: In system Is this a Tele or Face-to-Face Assessment?: Face-to-Face Is this an Initial Assessment or a Re-assessment for this encounter?: Initial Assessment Marital status: Single Maiden name:  (n/a) Is patient pregnant?: Yes Pregnancy Status: No Living Arrangements: Other (Comment) ([place]) Can pt return to current living arrangement?: Yes Admission Status: Voluntary Is patient capable of signing voluntary admission?: Yes Referral Source: Self/Family/Friend Insurance type:  (Medicaid )  Medical Screening Exam ([place]) Medical Exam completed: Yes  Crisis Care Plan Living Arrangements: Other (Comment) ([place]) Legal Guardian: Other: (None reported) Name of Psychiatrist: None Name of Therapist: None  Education Status Is patient currently in school?: No Current Grade:  (n/a) Highest grade of school patient has completed: Some college Name of school: NA Contact person:  NA  Risk to self with the past 6 months Suicidal Ideation: Yes-Currently Present Has patient been a risk to self within the past 6 months prior to admission? : Yes Suicidal Intent: No-Not Currently/Within Last 6 Months Has patient had any suicidal intent within the past 6 months prior to admission? : No Is patient at risk  for suicide?: Yes Suicidal Plan?: No Has patient had any suicidal plan within the past 6 months prior to admission? : No Specify Current Suicidal Plan:  (denies ) Access to Means: No What has been your use of drugs/alcohol within the last 12 months?:  (cocaine and alcohol ) Previous Attempts/Gestures: No How many times?:  (0) Other Self Harm Risks:  (n/a) Triggers for Past Attempts: Family contact, Hallucinations, Other (Comment) Intentional Self Injurious Behavior: None Family Suicide History: No Recent stressful life event(s): Conflict (Comment) Persecutory voices/beliefs?: No Depression: Yes Depression Symptoms: Feeling angry/irritable, Feeling worthless/self pity, Loss of interest in usual pleasures, Guilt, Fatigue, Insomnia, Isolating, Tearfulness, Despondent Substance abuse history and/or treatment for substance abuse?: Yes Suicide prevention information given to non-admitted patients: Not applicable  Risk to Others within the past 6 months Homicidal Ideation: No Does patient have any lifetime risk of violence toward others beyond the six months prior to admission? : No Thoughts of Harm to Others: No Current Homicidal Intent: No Current Homicidal Plan: No Access to Homicidal Means: No Identified Victim:  (n/a) History of harm to others?: No Assessment of Violence: None Noted Violent Behavior Description: patien denies  Does patient have access to weapons?: No Criminal Charges Pending?: No Does patient have a court date: No Is patient on probation?: No  Psychosis Hallucinations: Auditory Delusions: None noted  Mental Status Report Appearance/Hygiene: In scrubs Eye Contact: Poor Motor Activity: Freedom of movement Speech: Logical/coherent, Soft Level of Consciousness: Drowsy Mood: Depressed Affect: Depressed, Flat Anxiety Level: None Thought Processes: Relevant Judgement: Unimpaired Orientation: Person, Place, Time, Situation Obsessive Compulsive  Thoughts/Behaviors: None  Cognitive Functioning Concentration: Fair Memory: Recent Intact, Remote Intact IQ: Average Insight: Fair Impulse Control: Fair Appetite: Poor Weight Loss:  (0) Weight Gain:  (0) Sleep: Decreased Total Hours of Sleep:  (3 hrs of slee) Vegetative Symptoms: None  ADLScreening [facility] Assessment Services) Patient's cognitive ability adequate to safely complete daily activities?: Yes Patient able to express need for assistance with ADLs?: Yes Independently performs ADLs?: Yes (appropriate for developmental age)  Prior Inpatient Therapy Prior Inpatient Therapy: Yes Prior Therapy Dates: 2016, 2017 Prior Therapy Facilty/Provider(s): [facility], [facility] Reason for Treatment: substance abuse  Prior Outpatient Therapy Prior Outpatient Therapy: No Prior Therapy Dates: NA Prior Therapy Facilty/Provider(s): NA Reason for Treatment: NA Does patient have an ACCT team?: No Does patient have Intensive In-House Services?  : No Does patient have [facility] services? : No Does patient have P4CC services?: No  ADL Screening (condition at time of admission) Patient's cognitive ability adequate to safely complete daily activities?: Yes Is the patient deaf or have difficulty hearing?: No Does the patient have difficulty seeing, even when wearing glasses/contacts?: No Does the patient have difficulty concentrating, remembering, or making decisions?: No Patient able to express need for assistance with ADLs?: Yes Does the patient have difficulty dressing or bathing?: No Independently performs ADLs?: Yes (appropriate for developmental age) Does the patient have difficulty walking or climbing stairs?: No Weakness of Legs: None Weakness of Arms/Hands: None  Home Assistive Devices/Equipment Home Assistive Devices/Equipment: None    Abuse/Neglect Assessment (Assessment to be complete while patient is alone) Physical Abuse: Denies Verbal  Abuse: Denies Sexual Abuse: Denies Exploitation  of patient/patient's resources: Denies Self-Neglect: Denies Values / Beliefs Cultural Requests During Hospitalization: None Spiritual Requests During Hospitalization: None   Advance Directives (For Healthcare) Does patient have an advance directive?: No Would patient like information on creating an advanced directive?: No - patient declined information    Additional Information 1:1 In Past 12 Months?: No CIRT Risk: No Elopement Risk: No Does patient have medical clearance?: Yes     Disposition:  Disposition Initial Assessment Completed for this Encounter: Yes Disposition of Patient: Inpatient treatment program Type of inpatient treatment program: Adult (Per Reginold Agent, NP patient meets criteria for inpatient treat)  On Site Evaluation by:   Reviewed with Physician:    Waldon [facility]l [facility] 11/28/2015 4:30 PM

## 2015-11-28 NOTE — ED Provider Notes (Signed)
CSN: [id number]     Arrival date & time 11/28/15  1243 History   First MD Initiated Contact with Patient 11/28/15 1334     Chief Complaint  Patient presents with  . auditory hallucinations    . Suicidal     HPI Patient was [facility] ago for substance abuse and depression. Has had some auditory hallucinations 2. States he was doing well but began to do worse. States he got out and smoked crack again. Denies alcohol use. States he has some mild auditory hallucinations. He states it sort of gibberish but not telling him to do anything. Has some mild suicidal thoughts.   Past Medical History  Diagnosis Date  . Colon cancer ([place])   . Colon cancer ([place]) 2015  . 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 Substance abuse.   Past Surgical History  Procedure Laterality Date  . Partial colectomy  06/2014    done at [facility]   Family History  Problem Relation Age of Onset  . Coronary artery disease Cousin 54   Social History  Substance Use Topics  . Smoking status: Current Every Day Smoker -- 0.50 packs/day    Types: Cigarettes  . Smokeless tobacco: Never Used  . Alcohol Use: 0.6 oz/week    1 Cans of beer per week     Comment: drinks 2 times a week, last drink (09/2015)    Review of Systems  Constitutional: Negative for activity change and appetite change.  Eyes: Negative for pain.  Respiratory: Negative for chest tightness and shortness of breath.   Cardiovascular: Negative for chest pain and leg swelling.  Gastrointestinal: Negative for nausea, vomiting, abdominal pain and diarrhea.  Genitourinary: Negative for flank pain.  Musculoskeletal: Negative for back pain and neck stiffness.  Skin: Negative for rash.  Neurological: Negative for weakness, numbness and headaches.  Psychiatric/Behavioral: Positive for hallucinations. Negative for behavioral problems.      Allergies  Morphine and  related  Home Medications   Prior to Admission medications   Medication Sig Start Date End Date Taking? Authorizing Provider  ARIPiprazole (ABILIFY) 20 MG tablet Take 1 tablet (20 mg total) by mouth daily. For mood control 11/26/15  Yes Encarnacion Slates, NP  FLUoxetine (PROZAC) 20 MG capsule Take 1 capsule (20 mg total) by mouth daily. For depression 11/26/15  Yes Encarnacion Slates, NP  hydrOXYzine (ATARAX/VISTARIL) 25 MG tablet Take 1 tablet (25 mg total) by mouth every 6 (six) hours as needed for anxiety. 11/26/15  Yes Encarnacion Slates, NP  neomycin-bacitracin-polymyxin (NEOSPORIN) OINT Apply 1 application topically as needed for wound care. 11/26/15  Yes Encarnacion Slates, NP  topiramate (TOPAMAX) 50 MG tablet Take 1 tablet (50 mg total) by mouth daily. For mood stabilization 11/26/15  Yes Encarnacion Slates, NP  traZODone (DESYREL) 100 MG tablet Take 1 tablet (100 mg total) by mouth at bedtime. For sleep 11/26/15  Yes Encarnacion Slates, NP   BP 127/98 mmHg  Pulse 92  Temp(Src) 98.2 F (36.8 C) (Oral)  Resp 18  Ht 5\' 9"  (1.753 m)  Wt 250 lb (113.399 kg)  BMI 36.90 kg/m2  SpO2 99% Physical Exam  Constitutional: He is oriented to person, place, and time. He appears well-developed and well-nourished.  HENT:  Head: Normocephalic and atraumatic.  Eyes: Pupils are equal, round, and reactive to light.  Neck: Normal range of motion.  Cardiovascular: Normal rate, regular rhythm and normal heart  sounds.   No murmur heard. Pulmonary/Chest: Effort normal and breath sounds normal.  Abdominal: Soft. There is no tenderness.  Colostomy to left abdomen.  Musculoskeletal: Normal range of motion. He exhibits no edema.  Neurological: He is alert and oriented to person, place, and time. No cranial nerve deficit.  Skin: Skin is warm and dry.  Nursing note and vitals reviewed.   ED Course  Procedures (including critical care time) Labs Review Labs Reviewed  COMPREHENSIVE METABOLIC PANEL - Abnormal; Notable for the  following:    Creatinine, Ser 1.27 (*)    All other components within normal limits  ACETAMINOPHEN LEVEL - Abnormal; Notable for the following:    Acetaminophen (Tylenol), Serum <10 (*)    All other components within normal limits  URINE RAPID DRUG SCREEN, HOSP PERFORMED - Abnormal; Notable for the following:    Cocaine POSITIVE (*)    All other components within normal limits  ETHANOL  SALICYLATE LEVEL  CBC    Imaging Review No results found. I have personally reviewed and evaluated these images and lab results as part of my medical decision-making.   EKG Interpretation None      MDM   Final diagnoses:  Severe episode of recurrent major depressive disorder, with psychotic features ([place])  Cocaine abuse    Patient presents with suicidal thoughts and cocaine abuse. Discharge 2 days ago for behavioral health. States he was feeling good that is gotten worse. She appears medically cleared and will be seen by TTS.    Davonna Belling, MD 11/28/15 [phone]

## 2015-11-28 NOTE — ED Notes (Signed)
Pt oriented to room and unit.  Pt c/o audio hallucinations stating he had them before he used cocaine.  He denies SI and H/I.  15 minute checks and video monitoring in place.

## 2015-11-28 NOTE — ED Notes (Signed)
Pt has been changed into scrubs and wanded by security.  

## 2015-11-29 ENCOUNTER — Encounter (HOSPITAL_COMMUNITY): Payer: Self-pay | Admitting: Nurse Practitioner

## 2015-11-29 DIAGNOSIS — R45851 Suicidal ideations: Secondary | ICD-10-CM | POA: Diagnosis not present

## 2015-11-29 DIAGNOSIS — F339 Major depressive disorder, recurrent, unspecified: Secondary | ICD-10-CM

## 2015-11-29 NOTE — ED Notes (Signed)
Patient noted sleeping in room. No complaints, stable, in no acute distress. Q15 minute rounds and monitoring via Security Cameras to continue.  

## 2015-11-29 NOTE — Consult Note (Signed)
[facility] Consult   Reason for Consult:  Depression  Referring Physician:   Patient Identification: Richard [name] MRN:  [id number] Principal Diagnosis: MDD (major depressive disorder) ([place]) Diagnosis:   Patient Active Problem List   Diagnosis Date Noted  . MDD (major depressive disorder) ([place]) [F32.9] 11/16/2015    Priority: High  . Severe episode of recurrent major depressive disorder, with psychotic features ([place]) [F33.3]   . Severe episode of recurrent major depressive disorder, without psychotic features ([place]) [F33.2]   . MDD (major depressive disorder), recurrent episode, severe ([place]) [F33.2] 10/17/2015  . Cocaine abuse [F14.10] 10/17/2015  . Alcohol abuse [F10.10] 10/17/2015  . Severe single current episode of major depressive disorder, with psychotic features ([place]) [F32.3]   . Substance induced mood disorder ([place]) [F19.94] 10/08/2015  . 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 Colon cancer s/p chemo + radiation and resection. Now with colostomy bag [C18.9] 04/10/2015  . SVT (supraventricular tachycardia) ([place]) [I47.1] 04/10/2015  . Tobacco abuse [Z72.0] 04/10/2015    Total Time spent with patient: 30 minutes  Subjective:   Richard [name] is a 51 y.o. male patient admitted with major depression.  HPI:  Richard [name] is an 50 y.o. male presented to [facility] with c/o suicidal ideation, depression, auditory hallucinations, and headaches.  He was recently discharged from [facility].  He stated that he was sent to the [facility] and that they did not help him.  Reported he had no choice but to go back to the house where he used drugs with other people.  He reported that he was not aware of any community agencies that can help. Him  He was surprised to e told that places like [facility] could help him with meds.    Patient continues to endorse suicidal ideations with no plan/no intent. He denies history of suicide attempts/gestures. Patient's suicidal thoughts are triggered by his medical  issues and continous headaches.  Patient also reports cocaine use and last used this morning. Patient was admitted 3x's in the last 60 days to [facility] for similar symptoms. Patient follows up with [facility] for outpatient therapy.   Past Psychiatric History:    Risk to Self: Suicidal Ideation: Yes-Currently Present Suicidal Intent: No-Not Currently/Within Last 6 Months Is patient at risk for suicide?: Yes Suicidal Plan?: No Specify Current Suicidal Plan:  (denies ) Access to Means: No What has been your use of drugs/alcohol within the last 12 months?:  (cocaine and alcohol ) How many times?:  (0) Other Self Harm Risks:  (n/a) Triggers for Past Attempts: Family contact, Hallucinations, Other (Comment) Intentional Self Injurious Behavior: None Risk to Others: Homicidal Ideation: No Thoughts of Harm to Others: No Current Homicidal Intent: No Current Homicidal Plan: No Access to Homicidal Means: No Identified Victim:  (n/a) History of harm to others?: No Assessment of Violence: None Noted Violent Behavior Description: patien denies  Does patient have access to weapons?: No Criminal Charges Pending?: No Does patient have a court date: No Prior Inpatient Therapy: Prior Inpatient Therapy: Yes Prior Therapy Dates: 2016, 2017 Prior Therapy Facilty/Provider(s): [facility], [facility] Reason for Treatment: substance abuse Prior Outpatient Therapy: Prior Outpatient Therapy: No Prior Therapy Dates: NA Prior Therapy Facilty/Provider(s): NA Reason for Treatment: NA Does patient have an ACCT team?: No Does patient have Intensive In-House Services?  : No Does patient have [facility] services? : No Does patient have P4CC services?: No  Past Medical History:  Past Medical History  Diagnosis Date  . Colon cancer ([place])   . Colon  cancer ([place]) 2015  . Colostomy complication ([place]) 56-21-30    blood in colostomy bag    Past Surgical History  Procedure Laterality Date  . Partial colectomy  06/2014    done at  [facility]   Family History:  Family History  Problem Relation Age of Onset  . Coronary artery disease Cousin 19   Family Psychiatric  History:  See above noted Social History:  History  Alcohol Use  . 0.6 oz/week  . 1 Cans of beer per week    Comment: drinks 2 times a week, last drink (09/2015)     History  Drug Use  . Yes  . Special: Marijuana, Cocaine    Comment: Lst used 3 days ago    Social History   Social History  . Marital Status: Single    Spouse Name: N/A  . Number of Children: N/A  . Years of Education: N/A   Social History Main Topics  . Smoking status: Current Every Day Smoker -- 0.50 packs/day    Types: Cigarettes  . Smokeless tobacco: Never Used  . Alcohol Use: 0.6 oz/week    1 Cans of beer per week     Comment: drinks 2 times a week, last drink (09/2015)  . Drug Use: Yes    Special: Marijuana, Cocaine     Comment: Lst used 3 days ago  . Sexual Activity: No   Other Topics Concern  . None   Social History Narrative   Additional Social History:    Allergies:   Allergies  Allergen Reactions  . Morphine And Related Itching    Labs:  Results for orders placed or performed during the hospital encounter of 11/28/15 (from the past 48 hour(s))  Urine rapid drug screen (hosp performed) (Not at [facility])     Status: Abnormal   Collection Time: 11/28/15  1:13 PM  Result Value Ref Range   Opiates NONE DETECTED NONE DETECTED   Cocaine POSITIVE (A) NONE DETECTED   Benzodiazepines NONE DETECTED NONE DETECTED   Amphetamines NONE DETECTED NONE DETECTED   Tetrahydrocannabinol NONE DETECTED NONE DETECTED   Barbiturates NONE DETECTED NONE DETECTED    Comment:        DRUG SCREEN FOR MEDICAL PURPOSES ONLY.  IF CONFIRMATION IS NEEDED FOR ANY PURPOSE, NOTIFY LAB WITHIN 5 DAYS.        LOWEST DETECTABLE LIMITS FOR URINE DRUG SCREEN Drug Class       Cutoff (ng/mL) Amphetamine      1000 Barbiturate      200 Benzodiazepine   865 Tricyclics       784 Opiates           300 Cocaine          300 THC              50   Comprehensive metabolic panel     Status: Abnormal   Collection Time: 11/28/15  1:54 PM  Result Value Ref Range   Sodium 141 135 - 145 mmol/L   Potassium 4.2 3.5 - 5.1 mmol/L   Chloride 107 101 - 111 mmol/L   CO2 25 22 - 32 mmol/L   Glucose, Bld 97 65 - 99 mg/dL   BUN 15 6 - 20 mg/dL   Creatinine, Ser 1.27 (H) 0.61 - 1.24 mg/dL   Calcium 9.3 8.9 - 10.3 mg/dL   Total Protein 7.2 6.5 - 8.1 g/dL   Albumin 4.0 3.5 - 5.0 g/dL   AST 26 15 - 41 U/L  ALT 48 17 - 63 U/L   Alkaline Phosphatase 47 38 - 126 U/L   Total Bilirubin 0.5 0.3 - 1.2 mg/dL   GFR calc non Af Amer >60 >60 mL/min   GFR calc Af Amer >60 >60 mL/min    Comment: (NOTE) The eGFR has been calculated using the CKD EPI equation. This calculation has not been validated in all clinical situations. eGFR's persistently <60 mL/min signify possible Chronic Kidney Disease.    Anion gap 9 5 - 15  Ethanol (ETOH)     Status: None   Collection Time: 11/28/15  1:54 PM  Result Value Ref Range   Alcohol, Ethyl (B) <5 <5 mg/dL    Comment:        LOWEST DETECTABLE LIMIT FOR SERUM ALCOHOL IS 5 mg/dL FOR MEDICAL PURPOSES ONLY   Salicylate level     Status: None   Collection Time: 11/28/15  1:54 PM  Result Value Ref Range   Salicylate Lvl <0.6 2.8 - 30.0 mg/dL  Acetaminophen level     Status: Abnormal   Collection Time: 11/28/15  1:54 PM  Result Value Ref Range   Acetaminophen (Tylenol), Serum <10 (L) 10 - 30 ug/mL    Comment:        THERAPEUTIC CONCENTRATIONS VARY SIGNIFICANTLY. A RANGE OF 10-30 ug/mL MAY BE AN EFFECTIVE CONCENTRATION FOR MANY PATIENTS. HOWEVER, SOME ARE BEST TREATED AT CONCENTRATIONS OUTSIDE THIS RANGE. ACETAMINOPHEN CONCENTRATIONS >150 ug/mL AT 4 HOURS AFTER INGESTION AND >50 ug/mL AT 12 HOURS AFTER INGESTION ARE OFTEN ASSOCIATED WITH TOXIC REACTIONS.   CBC     Status: None   Collection Time: 11/28/15  1:54 PM  Result Value Ref Range   WBC 4.2  4.0 - 10.5 K/uL   RBC 4.39 4.22 - 5.81 MIL/uL   Hemoglobin 13.0 13.0 - 17.0 g/dL   HCT 39.0 39.0 - 52.0 %   MCV 88.8 78.0 - 100.0 fL   MCH 29.6 26.0 - 34.0 pg   MCHC 33.3 30.0 - 36.0 g/dL   RDW 15.2 11.5 - 15.5 %   Platelets 299 150 - 400 K/uL    Current Facility-Administered Medications  Medication Dose Route Frequency Provider Last Rate Last Dose  . acetaminophen (TYLENOL) tablet 650 mg  650 mg Oral Q4H PRN Davonna Belling, MD      . alum & mag hydroxide-simeth (MAALOX/MYLANTA) 200-200-20 MG/5ML suspension 30 mL  30 mL Oral PRN Davonna Belling, MD      . ARIPiprazole (ABILIFY) tablet 20 mg  20 mg Oral Daily Davonna Belling, MD   20 mg at 11/29/15 1057  . FLUoxetine (PROZAC) capsule 20 mg  20 mg Oral Daily Davonna Belling, MD   20 mg at 11/29/15 1058  . hydrOXYzine (ATARAX/VISTARIL) tablet 25 mg  25 mg Oral Q6H PRN Davonna Belling, MD      . ibuprofen (ADVIL,MOTRIN) tablet 600 mg  600 mg Oral Q8H PRN Davonna Belling, MD      . ondansetron [facility]) tablet 4 mg  4 mg Oral Q8H PRN Davonna Belling, MD      . topiramate (TOPAMAX) tablet 50 mg  50 mg Oral Daily Davonna Belling, MD   50 mg at 11/29/15 1058  . traZODone (DESYREL) tablet 100 mg  100 mg Oral QHS Davonna Belling, MD   100 mg at 11/28/15 2144   Current Outpatient Prescriptions  Medication Sig Dispense Refill  . ARIPiprazole (ABILIFY) 20 MG tablet Take 1 tablet (20 mg total) by mouth daily. For mood control 30 tablet  0  . FLUoxetine (PROZAC) 20 MG capsule Take 1 capsule (20 mg total) by mouth daily. For depression 30 capsule 0  . hydrOXYzine (ATARAX/VISTARIL) 25 MG tablet Take 1 tablet (25 mg total) by mouth every 6 (six) hours as needed for anxiety. 60 tablet 0  . neomycin-bacitracin-polymyxin (NEOSPORIN) OINT Apply 1 application topically as needed for wound care.    . topiramate (TOPAMAX) 50 MG tablet Take 1 tablet (50 mg total) by mouth daily. For mood stabilization 30 tablet 0  . traZODone (DESYREL) 100 MG tablet Take  1 tablet (100 mg total) by mouth at bedtime. For sleep 30 tablet 0    Musculoskeletal: Strength & Muscle Tone: within normal limits Gait & Station: normal Patient leans: N/A  Psychiatric Specialty Exam: Review of Systems  Gastrointestinal: Heartburn: colostomy.  Psychiatric/Behavioral: Positive for depression, suicidal ideas and substance abuse. The patient is nervous/anxious.   All other systems reviewed and are negative.   Blood pressure 111/68, pulse 77, temperature 98.2 F (36.8 C), temperature source Oral, resp. rate 16, height _0  (1.753 m), weight 113.399 kg (250 lb), SpO2 99 %.Body mass index is 36.9 kg/(m^2).   General Appearance: Disheveled  Eye Contact:: Minimal  Speech: Clear and Coherent and Slow  Volume: Decreased  Mood: Anxious and Depressed  Affect: Depressed and anxious worried  Thought Process: Coherent and Goal Directed  Orientation: Full (Time, Place, and Person)  Thought Content: symptoms events worries concerns  Suicidal Thoughts: Yes with no intent or plan and patient agrees to contract for safety while on the unit  Homicidal Thoughts:No, Homicidal ideations  Memory: Immediate; Fair Recent; Fair Remote; Fair  Judgement: Fair  Insight: Present and Shallow  Psychomotor Activity: Decrease  Concentration: Fair  Recall: [employer] of Knowledge:Fair  Language: Fair  Akathisia: No  Handed: Right  AIMS (if indicated):    Assets: Desire for Improvement Social Support  ADL's: Intact  Cognition: WNL  Sleep: Number of Hours: 4       Treatment Plan Summary: Observe for crisis management and mood stabilization. Medication management to re-stabilize current mood symptoms Medical consults as needed Review and reinstate any pertinent home medications for other health problems  Disposition:  No appropriate [place] bed placement at this time.  There is current referral to [facility].    Freda Munro May  Agustin, NP 11/29/2015 1:58 PM Patient seen face-to-face for psychiatric evaluation, chart reviewed and case discussed with the physician extender and developed treatment plan. Reviewed the information documented and agree with the treatment plan. Corena Pilgrim, MD

## 2015-11-29 NOTE — ED Notes (Signed)
gl

## 2015-11-29 NOTE — ED Notes (Signed)
Report from Collier Flowers. Patient sleeping, respirations regular and unlabored. Q15 minute rounds and security camera monitoring to continue.

## 2015-11-29 NOTE — ED Notes (Signed)
Patient is A&Ox4, denies SI/HI and A/V hallucinations at this time. Patient is isolating to his room, presents as depressed with a blunted affect. Patient verbally agrees to let staff know if anything changes.  Leina Babe, Thornton Dales, RN

## 2015-11-30 ENCOUNTER — Emergency Department (HOSPITAL_COMMUNITY)
Admission: EM | Admit: 2015-11-30 | Discharge: 2015-12-01 | Disposition: A | Discharge status: Home / Self Care | Payer: Medicaid Other | Attending: Emergency Medicine | Admitting: Emergency Medicine

## 2015-11-30 ENCOUNTER — Emergency Department (HOSPITAL_COMMUNITY): Payer: Medicaid Other

## 2015-11-30 ENCOUNTER — Encounter (HOSPITAL_COMMUNITY): Payer: Self-pay | Admitting: *Deleted

## 2015-11-30 DIAGNOSIS — Z933 Colostomy status: Secondary | ICD-10-CM | POA: Diagnosis not present

## 2015-11-30 DIAGNOSIS — Z85038 Personal history of other malignant neoplasm of large intestine: Secondary | ICD-10-CM | POA: Diagnosis not present

## 2015-11-30 DIAGNOSIS — F332 Major depressive disorder, recurrent severe without psychotic features: Secondary | ICD-10-CM | POA: Diagnosis not present

## 2015-11-30 DIAGNOSIS — R062 Wheezing: Secondary | ICD-10-CM | POA: Insufficient documentation

## 2015-11-30 DIAGNOSIS — F329 Major depressive disorder, single episode, unspecified: Secondary | ICD-10-CM | POA: Diagnosis not present

## 2015-11-30 DIAGNOSIS — R091 Pleurisy: Secondary | ICD-10-CM | POA: Diagnosis not present

## 2015-11-30 DIAGNOSIS — F419 Anxiety disorder, unspecified: Secondary | ICD-10-CM | POA: Diagnosis not present

## 2015-11-30 DIAGNOSIS — R0789 Other chest pain: Secondary | ICD-10-CM | POA: Diagnosis not present

## 2015-11-30 DIAGNOSIS — F17210 Nicotine dependence, cigarettes, uncomplicated: Secondary | ICD-10-CM | POA: Diagnosis not present

## 2015-11-30 DIAGNOSIS — R079 Chest pain, unspecified: Secondary | ICD-10-CM | POA: Diagnosis present

## 2015-11-30 MED ORDER — FLUOXETINE HCL 20 MG PO CAPS: 20.0000 mg | ORAL_CAPSULE | Freq: Every day | ORAL | Status: AC

## 2015-11-30 MED ORDER — HYDROXYZINE HCL 25 MG PO TABS: 25.0000 mg | ORAL_TABLET | Freq: Four times a day (QID) | ORAL | Status: AC | PRN

## 2015-11-30 MED ORDER — TRAZODONE HCL 100 MG PO TABS: 100.0000 mg | ORAL_TABLET | Freq: Every day | ORAL | Status: AC

## 2015-11-30 MED ORDER — FENTANYL CITRATE (PF) 100 MCG/2ML IJ SOLN
50.0000 ug | Freq: Once | INTRAMUSCULAR | Status: AC
  Administered 2015-12-01: 50 ug via INTRAVENOUS
  Filled 2015-11-30: qty 2

## 2015-11-30 MED ORDER — ARIPIPRAZOLE 20 MG PO TABS: 20.0000 mg | ORAL_TABLET | Freq: Every day | ORAL | Status: AC

## 2015-11-30 MED ORDER — LORAZEPAM 2 MG/ML IJ SOLN
1.0000 mg | Freq: Once | INTRAMUSCULAR | Status: AC
  Administered 2015-12-01: 1 mg via INTRAVENOUS
  Filled 2015-11-30: qty 1

## 2015-11-30 MED ORDER — TOPIRAMATE 50 MG PO TABS: 50.0000 mg | ORAL_TABLET | Freq: Every day | ORAL | Status: AC

## 2015-11-30 NOTE — [facility] Suicide Risk Assessment (Cosign Needed)
Suicide Risk Assessment  Discharge Assessment   [facility] Discharge Suicide Risk Assessment   Principal Problem: MDD (major depressive disorder) [facility]) Discharge Diagnoses:  Patient Active Problem List   Diagnosis Date Noted  . MDD (major depressive disorder) ([place]) [F32.9] 11/16/2015    Priority: Medium  . Severe episode of recurrent major depressive disorder, with psychotic features ([place]) [F33.3]   . Severe episode of recurrent major depressive disorder, without psychotic features ([place]) [F33.2]   . MDD (major depressive disorder), recurrent episode, severe ([place]) [F33.2] 10/17/2015  . Cocaine abuse [F14.10] 10/17/2015  . Alcohol abuse [F10.10] 10/17/2015  . Severe single current episode of major depressive disorder, with psychotic features ([place]) [F32.3]   . Substance induced mood disorder ([place]) [F19.94] 10/08/2015  . Colostomy complication ([place]) [id number]   . Colon cancer s/p chemo + radiation and resection. Now with colostomy bag [C18.9] 04/10/2015  . SVT (supraventricular tachycardia) ([place]) [I47.1] 04/10/2015  . Tobacco abuse [Z72.0] 04/10/2015    Total Time spent with patient: 20 minutes  Musculoskeletal: Strength & Muscle Tone: within normal limits Gait & Station: normal Patient leans: N/A  Psychiatric Specialty Exam:   Blood pressure 90/67, pulse 84, temperature 97.8 F (36.6 C), temperature source Oral, resp. rate 16, height 5\' 9"  (1.753 m), weight 113.399 kg (250 lb), SpO2 100 %.Body mass index is 36.9 kg/(m^2).  General Appearance: Casual  Eye Contact::  Good  Speech:  Clear and Coherent and Normal Rate  Volume:  Normal  Mood:  Depressed  Affect:  Congruent  Thought Process:  Coherent, Goal Directed and Intact  Orientation:  Full (Time, Place, and Person)  Thought Content:  WDL  Suicidal Thoughts:  No  Homicidal Thoughts:  No  Memory:  Immediate;   Good Recent;   Good Remote;   Good  Judgement:  Good  Insight:  Good  Psychomotor Activity:  Normal  Concentration:   Good  Recall:  NA  Fund of Knowledge:  Good  Language:  Good  Akathisia:  NA  Handed:  Right  AIMS (if indicated):     Assets:  Desire for Improvement Financial Resources/Insurance Housing Transportation  ADL's:  Intact  Cognition:  WNL     Mental Status Per Nursing Assessment::   On Admission:     Demographic Factors:  Male, Low socioeconomic status, Living alone and Unemployed  Loss Factors: NA  Historical Factors: NA  Risk Reduction Factors:   Religious beliefs about death  Continued Clinical Symptoms:  Depression:   Insomnia Alcohol/Substance Abuse/Dependencies  Cognitive Features That Contribute To Risk:  Polarized thinking    Suicide Risk:  Minimal: No identifiable suicidal ideation.  Patients presenting with no risk factors but with morbid ruminations; may be classified as minimal risk based on the severity of the depressive symptoms  Follow-up Information    Schedule an appointment as soon as possible for a visit with [facility].   Why:  This is your assigned Medicaid [place] access doctor If you prefer another contact [place] assigned your doctor *You may receive a bill if you go to any family Dr not assigned to you, As needed   Contact information:   Medicaid [place] access pcp response hx states [place], [place] [address] [phone]            Follow up with Medicaid [place] Access Covered Patient .   [employer]: [id number] [place],  [place] [address] [email or web address] Use this website to assist with understanding your coverage & to renew application   Contact information:   As a Medicaid client you MUST contact DSS/SSI each time you change address, move to another [place] or another state to keep your address updated  [place] Medicaid Transportation to Dr appts if you are have full Medicaid: [phone],  [phone]      Plan Of Care/Follow-up recommendations:  Activity:  As tolerated  Diet:  regular  Delfin Gant, NP   PMHNP-BC 11/30/2015, 11:46 AM

## 2015-11-30 NOTE — ED Notes (Signed)
Patient noted sleeping in room. No complaints, stable, in no acute distress. Q15 minute rounds and monitoring via Security Cameras to continue.  

## 2015-11-30 NOTE — ED Notes (Signed)
The pt arrib from ambulance  C/o rt chest pain for 2 hours c/o  Sob none now  He was seen at [facility] yesterday for depression

## 2015-11-30 NOTE — Consult Note (Signed)
Psychiatric Specialty Exam: Physical Exam  ROS  Blood pressure 90/67, pulse 84, temperature 97.8 F (36.6 C), temperature source Oral, resp. rate 16, height 5\' 9"  (1.753 m), weight 113.399 kg (250 lb), SpO2 100 %.Body mass index is 36.9 kg/(m^2).  General Appearance: Casual  Eye Contact::  Good  Speech:  Clear and Coherent and Normal Rate  Volume:  Normal  Mood:  Depressed  Affect:  Congruent  Thought Process:  Coherent, Goal Directed and Intact  Orientation:  Full (Time, Place, and Person)  Thought Content:  WDL  Suicidal Thoughts:  No  Homicidal Thoughts:  No  Memory:  Immediate;   Good Recent;   Good Remote;   Good  Judgement:  Good  Insight:  Good  Psychomotor Activity:  Normal  Concentration:  Good  Recall:  NA  Fund of Knowledge:  Good  Language:  Good  Akathisia:  NA  Handed:  Right  AIMS (if indicated):     Assets:  Desire for Improvement Financial Resources/Insurance Housing Transportation  ADL's:  Intact  Cognition:  WNL  Sleep:      Patient is calm and resting comfortably.  He reports that his mood is improving.  Patient states that he understands he need outpatient treatment for his medication management.  Patient reports good sleep and appetite while he is here.  He denies SI/HI/AVH.  Patient is discharged home today.  He will follow up with [facility] for his medication management and treatment of his depression. MDD (major depressive disorder) ([place])   Plan:  Discharge, follow up with Eleanora Neighbor   PMHNP-BC Patient seen face-to-face for psychiatric evaluation, chart reviewed and case discussed with the physician extender and developed treatment plan. Reviewed the information documented and agree with the treatment plan. Corena Pilgrim, MD

## 2015-11-30 NOTE — ED Notes (Signed)
On the phone 

## 2015-11-30 NOTE — ED Notes (Signed)
Dr a and josephine np onto see

## 2015-11-30 NOTE — ED Notes (Addendum)
Pt ambulatory with out difficulty to dc area with mHt.  Belongings returned after leaving the area.

## 2015-11-30 NOTE — ED Notes (Addendum)
Pt denies si/hi/avh at this time.  Written dc instructions and prescriptions reviewed with pt.  Pt encouraged to take his medications as directed and follow up with his md as instructed.   Pt verbalized understanding

## 2015-11-30 NOTE — ED Provider Notes (Addendum)
CSN: [id number]     Arrival date & time 11/30/15  2020 History  By signing my name below, I, Hansel Feinstein, attest that this documentation has been prepared under the direction and in the presence of Varney Biles, MD. Electronically Signed: Hansel Feinstein, ED Scribe. 11/30/2015. 11:35 PM.    Chief Complaint  Patient presents with  . Chest Pain   The history is provided by the patient. No language interpreter was used.   HPI Comments: Richard [name] is a 50 y.o. male with h/o colon cancer who presents to the Emergency Department complaining of moderate, constant, right upper and substernal CP onset 3 hours ago while walking with associated wheezing. He also notes occasional radiation of pain to his right shoulder. No h/o lung disease, asthma, COPD. Pt states his pain is worsened with breathing. Pt notes that he ate a burger and hot dog prior to onset of pain. He states that this pain feels similar to heart burn, but more severe. Pt reports he suffers from depression and anxiety, which also frequently causes him similar chest discomfort. He states that he has daily chest discomfort at baseline. No recent long distance travel, surgery. No h/o PE/DVT, MI, CHF. Pt has been cancer free for 5 months. Pt is a current smoker. He also endorses cocaine and marijuana use for 3 years. Last cocaine use was a month ago. He states he both snorts and uses crack. Pt also complains of pain around his colostomy site with a small amount of blood in the bag. Oncologist is in [place]. He denies cough, hemoptysis, SOB.   On record review, CT chest angio PE were normal x 3 times in the last 6 months. [place] in 07/2015 showed no gallstones.   Past Medical History  Diagnosis Date  . Colon cancer ([place])   . Colon cancer ([place]) 2015  . 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 Partial colectomy  06/2014    done at [facility]   Family History  Problem  Relation Age of Onset  . Coronary artery disease Cousin 19   Social History  Substance Use Topics  . Smoking status: Current Every Day Smoker -- 0.50 packs/day    Types: Cigarettes  . Smokeless tobacco: Never Used  . Alcohol Use: 0.6 oz/week    1 Cans of beer per week     Comment: drinks 2 times a week, last drink (09/2015)    Review of Systems  A complete 10 system review of systems was obtained and all systems are negative except as noted in the HPI and PMH.    Allergies  Morphine and related  Home Medications   Prior to Admission medications   Medication Sig Start Date End Date Taking? Authorizing Provider  ARIPiprazole (ABILIFY) 20 MG tablet Take 1 tablet (20 mg total) by mouth daily. 11/30/15  Yes Delfin Gant, NP  FLUoxetine (PROZAC) 20 MG capsule Take 1 capsule (20 mg total) by mouth daily. 11/30/15  Yes Delfin Gant, NP  hydrOXYzine (ATARAX/VISTARIL) 25 MG tablet Take 1 tablet (25 mg total) by mouth every 6 (six) hours as needed for anxiety. 11/30/15  Yes Delfin Gant, NP  neomycin-bacitracin-polymyxin (NEOSPORIN) OINT Apply 1 application topically as needed for wound care. 11/26/15  Yes Encarnacion Slates, NP  topiramate (TOPAMAX) 50 MG tablet Take 1 tablet (50 mg total) by mouth daily. 11/30/15  Yes Delfin Gant, NP  traZODone (  DESYREL) 100 MG tablet Take 1 tablet (100 mg total) by mouth at bedtime. 11/30/15  Yes Delfin Gant, NP   BP 95/70 mmHg  Pulse 82  Temp(Src) 97.6 F (36.4 C) (Oral)  Resp 15  SpO2 96% Physical Exam  Constitutional: He is oriented to person, place, and time. He appears well-developed and well-nourished.  HENT:  Head: Normocephalic and atraumatic.  Eyes: Conjunctivae and EOM are normal. Pupils are equal, round, and reactive to light.  Neck: Normal range of motion. Neck supple.  Cardiovascular: Normal rate, regular rhythm and normal heart sounds.  Exam reveals no gallop and no friction rub.   No murmur heard. RRR. 2+ and  equal radial pulse bilaterally.   Pulmonary/Chest: Effort normal. No respiratory distress. He has wheezes. He has no rales.  Fine inspiratory wheezing at the bases. No rhonchi, no rales.   Abdominal: Soft. Bowel sounds are normal. He exhibits no distension. There is no tenderness.  Positive bowel sounds. Colostomy bag in place to LLQ with no evidence of gross blood.   Musculoskeletal: Normal range of motion.  Neurological: He is alert and oriented to person, place, and time. No cranial nerve deficit.  Cranial nerves 2-12 grossly intact. Upper extremity sensory exam is equal bilaterally. Upper extremity grip strength equal bilaterally.   Skin: Skin is warm and dry.  Psychiatric: He has a normal mood and affect. His behavior is normal.  Nursing note and vitals reviewed.   ED Course  Procedures (including critical care time)  @7 :00 : PT's chest pain is better. @7 :45: Chest pain free at this time. Will d/c. Vascular exam is still normal and no neuro complains. Dissection risk factors discussed and our concerns for it in his case discussed. Pt agrees to not getting CT now- as he reports that the current pain has been off and on for several months now - which appears to be the case given the multiple CTs. Still, he is made aware to come back to the ER if the symptoms get worse. Strict ER return precautions have been discussed, and patient is agreeing with the plan and is comfortable with the workup done and the recommendations from the ER.   DIAGNOSTIC STUDIES: Oxygen Saturation is 94% on RA, adequate by my interpretation.    COORDINATION OF CARE: 11:22 PM Discussed treatment plan with pt at bedside which includes CXR, lab work, EKG and pt agreed to plan.   Labs Review Labs Reviewed  CBC WITH DIFFERENTIAL/PLATELET - Abnormal; Notable for the following:    RBC 4.13 (*)    Hemoglobin 11.8 (*)    HCT 35.8 (*)    All other components within normal limits  COMPREHENSIVE METABOLIC PANEL -  Abnormal; Notable for the following:    Glucose, Bld 118 (*)    Creatinine, Ser 1.31 (*)    Albumin 3.4 (*)    Total Bilirubin 0.2 (*)    All other components within normal limits  TROPONIN I  LIPASE, BLOOD  TROPONIN I    Imaging Review Dg Chest 2 View  11/30/2015  CLINICAL DATA:  Acute onset of generalized chest pain and abdominal pain. Shortness of breath. Initial encounter. EXAM: CHEST  2 VIEW COMPARISON:  Chest radiograph and CTA of the chest performed 10/07/2015 FINDINGS: The lungs are well-aerated. Minimal bibasilar atelectasis is noted. There is no evidence of pleural effusion or pneumothorax. The heart is normal in size; the mediastinal contour is within normal limits. A left-sided chest port is noted at the proximal SVC.  No acute osseous abnormalities are seen. IMPRESSION: Minimal bibasilar atelectasis noted.  Lungs otherwise clear. Electronically Signed   By: Garald Balding M.D.   On: 11/30/2015 21:50   I have personally reviewed and evaluated these images and lab results as part of my medical decision-making.   EKG Interpretation   Date/Time:  Saturday November 30 2015 20:30:55 EDT Ventricular Rate:  98 PR Interval:  140 QRS Duration: 84 QT Interval:  356 QTC Calculation: 454 R Axis:   61 Text Interpretation:  Normal sinus rhythm Normal ECG No significant change  since last tracing No acute changes Confirmed by Kathrynn Humble, MD, Thelma Comp  [phone]) on 11/30/2015 11:03:21 PM      MDM   Final diagnoses:  Pleurisy  Atypical chest pain    I personally performed the services described in this documentation, which was scribed in my presence. The recorded information has been reviewed and is accurate.  Pt comes in with cc of chest pain. Chest pain is R sided, atypical, but sometimes radiates to the back. Pain started after he ate food. Pt has hx of cocaine use. Vascular exam  - normal and equal radial pulse and femoral pulse. Neuro exam is non focal. Pt is not  tachycardic.  Differential diagnosis includes: ACS syndrome CHF exacerbation Valvular disorder Myocarditis / Pericarditis Pneumonia Pleural effusion Pulmonary edema PE Musculoskeletal pain Dissection  CT angio chest done on 1/23 - and it didn't show any signs of dissection. This - along with no other soft findings that are concerning for dissection on exam - makes the pretest probability for dissection less likely.  We will get basic labs and reassess.  Current consideration is that the pain could be esophageal spasm.   Varney Biles, MD 12/01/15 [id number]  Varney Biles, MD 12/01/15 [phone]

## 2015-12-01 LAB — CBC WITH DIFFERENTIAL/PLATELET
BASOS ABS: 0 10*3/uL (ref 0.0–0.1)
Basophils Relative: 0 %
EOS ABS: 0.3 10*3/uL (ref 0.0–0.7)
EOS PCT: 5 %
HCT: 35.8 % — ABNORMAL LOW (ref 39.0–52.0)
Hemoglobin: 11.8 g/dL — ABNORMAL LOW (ref 13.0–17.0)
Lymphocytes Relative: 23 %
Lymphs Abs: 1.1 10*3/uL (ref 0.7–4.0)
MCH: 28.6 pg (ref 26.0–34.0)
MCHC: 33 g/dL (ref 30.0–36.0)
MCV: 86.7 fL (ref 78.0–100.0)
MONO ABS: 0.5 10*3/uL (ref 0.1–1.0)
Monocytes Relative: 10 %
Neutro Abs: 3 10*3/uL (ref 1.7–7.7)
Neutrophils Relative %: 62 %
PLATELETS: 255 10*3/uL (ref 150–400)
RBC: 4.13 MIL/uL — AB (ref 4.22–5.81)
RDW: 15 % (ref 11.5–15.5)
WBC: 4.9 10*3/uL (ref 4.0–10.5)

## 2015-12-01 LAB — COMPREHENSIVE METABOLIC PANEL
ALT: 38 U/L (ref 17–63)
AST: 17 U/L (ref 15–41)
Albumin: 3.4 g/dL — ABNORMAL LOW (ref 3.5–5.0)
Alkaline Phosphatase: 48 U/L (ref 38–126)
Anion gap: 11 (ref 5–15)
BUN: 19 mg/dL (ref 6–20)
CHLORIDE: 106 mmol/L (ref 101–111)
CO2: 25 mmol/L (ref 22–32)
CREATININE: 1.31 mg/dL — AB (ref 0.61–1.24)
Calcium: 9.5 mg/dL (ref 8.9–10.3)
Glucose, Bld: 118 mg/dL — ABNORMAL HIGH (ref 65–99)
POTASSIUM: 3.8 mmol/L (ref 3.5–5.1)
SODIUM: 142 mmol/L (ref 135–145)
Total Bilirubin: 0.2 mg/dL — ABNORMAL LOW (ref 0.3–1.2)
Total Protein: 6.8 g/dL (ref 6.5–8.1)

## 2015-12-01 LAB — LIPASE, BLOOD: LIPASE: 30 U/L (ref 11–51)

## 2015-12-01 LAB — TROPONIN I: Troponin I: <0.03 ng/mL (ref <0.031)

## 2015-12-01 NOTE — Discharge Instructions (Signed)
We saw you in the ER for the chest pain/shortness of breath. All of our cardiac workup is normal, including labs, EKG and chest X-RAY are normal. We are not sure what is causing your discomfort, but we feel comfortable sending you home at this time. The workup in the ER is not complete, and you should follow up with your primary care doctor for further evaluation.  Please return to the ER if you have worsening chest pain, shortness of breath, pain radiating to your jaw, shoulder, or back, sweats or fainting. Otherwise see the primary care doctor.   Nonspecific Chest Pain  Chest pain can be caused by many different conditions. There is always a chance that your pain could be related to something serious, such as a heart attack or a blood clot in your lungs. Chest pain can also be caused by conditions that are not life-threatening. If you have chest pain, it is very important to follow up with your health care provider. CAUSES  Chest pain can be caused by:  Heartburn.  Pneumonia or bronchitis.  Anxiety or stress.  Inflammation around your heart (pericarditis) or lung (pleuritis or pleurisy).  A blood clot in your lung.  A collapsed lung (pneumothorax). It can develop suddenly on its own (spontaneous pneumothorax) or from trauma to the chest.  Shingles infection (varicella-zoster virus).  Heart attack.  Damage to the bones, muscles, and cartilage that make up your chest wall. This can include:  Bruised bones due to injury.  Strained muscles or cartilage due to frequent or repeated coughing or overwork.  Fracture to one or more ribs.  Sore cartilage due to inflammation (costochondritis). RISK FACTORS  Risk factors for chest pain may include:  Activities that increase your risk for trauma or injury to your chest.  Respiratory infections or conditions that cause frequent coughing.  Medical conditions or overeating that can cause heartburn.  Heart disease or family history of  heart disease.  Conditions or health behaviors that increase your risk of developing a blood clot.  Having had chicken pox (varicella zoster). SIGNS AND SYMPTOMS Chest pain can feel like:  Burning or tingling on the surface of your chest or deep in your chest.  Crushing, pressure, aching, or squeezing pain.  Dull or sharp pain that is worse when you move, cough, or take a deep breath.  Pain that is also felt in your back, neck, shoulder, or arm, or pain that spreads to any of these areas. Your chest pain may come and go, or it may stay constant. DIAGNOSIS Lab tests or other studies may be needed to find the cause of your pain. Your health care provider may have you take a test called an ambulatory ECG (electrocardiogram). An ECG records your heartbeat patterns at the time the test is performed. You may also have other tests, such as:  Transthoracic echocardiogram (TTE). During echocardiography, sound waves are used to create a picture of all of the heart structures and to look at how blood flows through your heart.  Transesophageal echocardiogram (TEE).This is a more advanced imaging test that obtains images from inside your body. It allows your health care provider to see your heart in finer detail.  Cardiac monitoring. This allows your health care provider to monitor your heart rate and rhythm in real time.  Holter monitor. This is a portable device that records your heartbeat and can help to diagnose abnormal heartbeats. It allows your health care provider to track your heart activity for several days,  if needed.  Stress tests. These can be done through exercise or by taking medicine that makes your heart beat more quickly.  Blood tests.  Imaging tests. TREATMENT  Your treatment depends on what is causing your chest pain. Treatment may include:  Medicines. These may include:  Acid blockers for heartburn.  Anti-inflammatory medicine.  Pain medicine for inflammatory  conditions.  Antibiotic medicine, if an infection is present.  Medicines to dissolve blood clots.  Medicines to treat coronary artery disease.  Supportive care for conditions that do not require medicines. This may include:  Resting.  Applying heat or cold packs to injured areas.  Limiting activities until pain decreases. HOME CARE INSTRUCTIONS  If you were prescribed an antibiotic medicine, finish it all even if you start to feel better.  Avoid any activities that bring on chest pain.  Do not use any tobacco products, including cigarettes, chewing tobacco, or electronic cigarettes. If you need help quitting, ask your health care provider.  Do not drink alcohol.  Take medicines only as directed by your health care provider.  Keep all follow-up visits as directed by your health care provider. This is important. This includes any further testing if your chest pain does not go away.  If heartburn is the cause for your chest pain, you may be told to keep your head raised (elevated) while sleeping. This reduces the chance that acid will go from your stomach into your esophagus.  Make lifestyle changes as directed by your health care provider. These may include:  Getting regular exercise. Ask your health care provider to suggest some activities that are safe for you.  Eating a heart-healthy diet. A registered dietitian can help you to learn healthy eating options.  Maintaining a healthy weight.  Managing diabetes, if necessary.  Reducing stress. SEEK MEDICAL CARE IF:  Your chest pain does not go away after treatment.  You have a rash with blisters on your chest.  You have a fever. SEEK IMMEDIATE MEDICAL CARE IF:   Your chest pain is worse.  You have an increasing cough, or you cough up blood.  You have severe abdominal pain.  You have severe weakness.  You faint.  You have chills.  You have sudden, unexplained chest discomfort.  You have sudden, unexplained  discomfort in your arms, back, neck, or jaw.  You have shortness of breath at any time.  You suddenly start to sweat, or your skin gets clammy.  You feel nauseous or you vomit.  You suddenly feel light-headed or dizzy.  Your heart begins to beat quickly, or it feels like it is skipping beats. These symptoms may represent a serious problem that is an emergency. Do not wait to see if the symptoms will go away. Get medical help right away. Call your local emergency services (911 in the [place] Do not drive yourself to the hospital.   This information is not intended to replace advice given to you by your health care provider. Make sure you discuss any questions you have with your health care provider.   Document Released: 06/10/2005 Document Revised: 09/21/2014 Document Reviewed: 04/06/2014 Elsevier Interactive Patient Education [employer].

## 2015-12-01 NOTE — ED Notes (Signed)
Called mini lab, Roxanne stated she sent the blood off. Called main lab, stated they have no blood. Was instructed to redraw blood and resend. Informed Richard [name] to please redraw.

## 2015-12-01 NOTE — ED Notes (Signed)
The pt is awake and alert

## 2015-12-01 NOTE — ED Notes (Signed)
The pt is sound asleep snoring

## 2016-08-27 ENCOUNTER — Encounter (HOSPITAL_COMMUNITY): Payer: Self-pay | Admitting: *Deleted

## 2016-08-27 ENCOUNTER — Emergency Department (HOSPITAL_COMMUNITY)
Admission: EM | Admit: 2016-08-27 | Discharge: 2016-08-27 | Disposition: A | Discharge status: Home / Self Care | Payer: Medicaid Other | Attending: Physician Assistant | Admitting: Physician Assistant

## 2016-08-27 DIAGNOSIS — F17210 Nicotine dependence, cigarettes, uncomplicated: Secondary | ICD-10-CM | POA: Diagnosis not present

## 2016-08-27 DIAGNOSIS — Z85038 Personal history of other malignant neoplasm of large intestine: Secondary | ICD-10-CM | POA: Insufficient documentation

## 2016-08-27 DIAGNOSIS — M545 Low back pain, unspecified: Secondary | ICD-10-CM

## 2016-08-27 DIAGNOSIS — G8929 Other chronic pain: Secondary | ICD-10-CM | POA: Diagnosis not present

## 2016-08-27 DIAGNOSIS — Z79899 Other long term (current) drug therapy: Secondary | ICD-10-CM | POA: Diagnosis not present

## 2016-08-27 LAB — URINALYSIS, ROUTINE W REFLEX MICROSCOPIC
BILIRUBIN URINE: NEGATIVE
Glucose, UA: NEGATIVE mg/dL
HGB URINE DIPSTICK: NEGATIVE
KETONES UR: NEGATIVE mg/dL
Leukocytes, UA: NEGATIVE
NITRITE: NEGATIVE
PROTEIN: NEGATIVE mg/dL
Specific Gravity, Urine: 1.006 (ref 1.005–1.030)
pH: 8 (ref 5.0–8.0)

## 2016-08-27 MED ORDER — OXYCODONE-ACETAMINOPHEN 5-325 MG PO TABS
1.0000 | ORAL_TABLET | Freq: Once | ORAL | Status: AC
Start: 2016-08-27 — End: 2016-08-27
  Administered 2016-08-27: 1 via ORAL
  Filled 2016-08-27: qty 1

## 2016-08-27 NOTE — ED Triage Notes (Signed)
Patient adds that over the last few hours he has had increasing urination.  He is seeing Dr. Stormy Fabian at [facility] for his cancer and has an appointment to started chemo in 2 weeks.

## 2016-08-27 NOTE — ED Provider Notes (Signed)
[facility] DEPT Provider Note   CSN: [id number] Arrival date & time: 08/27/16  0414     History   Chief Complaint Chief Complaint  Patient presents with  . Back Pain    HPI Densel Mehrotra is a 50 y.o. male.  HPI   Patient is a 50 year old male presenting with chronic back pain. Patient is a difficult historian. He reports that he has colon cancer with metastases to the back. He reports that it was in remission. However the last 2 months to 3 months he's been having increasing back pain, they did a CAT scan which showed that there was new cancer in his L-spine around the bifurcation of the aorta. He reports [facility]'s been followed [facility] for [facility]. He says that he had increasing pain last night so he came to emergency department.  I asked patient why he is here at this hospital rather than at [facility] and he reports that "today I moved to [place]".  However he does plan to follow up with his oncologist at [facility] as planned. He has an appointment in 2 weeks.  Patient is not having any increased difficulty in relation except for mild pain. He is not having foot drop, no new neurologic symptoms.  He does reports that overnight while he was in the emergency department he peed several times. He has no fever,   Past Medical History:  Diagnosis Date  . Colon cancer ([place])   . Colon cancer ([place]) 2015  . 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 Severe episode of recurrent major depressive disorder, with psychotic features ([place])   . MDD (major depressive disorder) 11/16/2015  . Severe episode of recurrent major depressive disorder, without psychotic features ([place])   . MDD (major depressive disorder), recurrent episode, severe ([place]) 10/17/2015  . Cocaine abuse 10/17/2015  . Alcohol abuse 10/17/2015  . Severe single current episode of major depressive disorder, with psychotic features ([place])    . Substance induced mood disorder ([place]) 10/08/2015  . 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 Colon cancer s/p chemo + radiation and resection. Now with colostomy bag 04/10/2015  . SVT (supraventricular tachycardia) ([place]) 04/10/2015  . Tobacco abuse 04/10/2015    Past Surgical History:  Procedure Laterality Date  . PARTIAL COLECTOMY  06/2014   done at [facility] Medications    Prior to Admission medications   Medication Sig Start Date End Date Taking? Authorizing Provider  sertraline (ZOLOFT) 100 MG tablet Take 1 tablet by mouth daily. 08/11/16 08/11/17 Yes Historical Provider, MD  ARIPiprazole (ABILIFY) 20 MG tablet Take 1 tablet (20 mg total) by mouth daily. Patient not taking: Reported on 08/27/2016 11/30/15   Delfin Gant, NP  FLUoxetine (PROZAC) 20 MG capsule Take 1 capsule (20 mg total) by mouth daily. Patient not taking: Reported on 08/27/2016 11/30/15   Delfin Gant, NP  hydrOXYzine (ATARAX/VISTARIL) 25 MG tablet Take 1 tablet (25 mg total) by mouth every 6 (six) hours as needed for anxiety. Patient not taking: Reported on 08/27/2016 11/30/15   Delfin Gant, NP  neomycin-bacitracin-polymyxin (NEOSPORIN) OINT Apply 1 application topically as needed for wound care. Patient not taking: Reported on 08/27/2016 11/26/15   Encarnacion Slates, NP  topiramate (TOPAMAX) 50 MG tablet Take 1 tablet (50 mg total) by mouth daily. Patient not taking: Reported on 08/27/2016 11/30/15   [place] Agent  Sharlene Motts, NP  traZODone (DESYREL) 100 MG tablet Take 1 tablet (100 mg total) by mouth at bedtime. Patient not taking: Reported on 08/27/2016 11/30/15   Delfin Gant, NP    Family History Family History  Problem Relation Age of Onset  . Coronary artery disease Cousin 70    Social History Social History  Substance Use Topics  . Smoking status: Current Every Day Smoker    Packs/day: 0.50    Types: Cigarettes  . Smokeless tobacco: Never Used  . Alcohol use 0.6 oz/week    1  Cans of beer per week     Comment: drinks 2 times a week, last drink (09/2015)     Allergies   Morphine and related   Review of Systems Review of Systems  Constitutional: Positive for fatigue. Negative for activity change and fever.  Respiratory: Negative for shortness of breath.   Cardiovascular: Negative for chest pain.  Gastrointestinal: Negative for abdominal pain.  Genitourinary: Positive for frequency. Negative for difficulty urinating and dysuria.  Musculoskeletal: Positive for back pain.  Psychiatric/Behavioral: Negative for agitation and confusion.  All other systems reviewed and are negative.    Physical Exam Updated Vital Signs BP 138/92 (BP Location: Right Arm)   Pulse 70   Temp 97.5 F (36.4 C) (Oral)   Resp 18   Ht 5\' 9"  (1.753 m)   Wt 230 lb (104.3 kg)   SpO2 99%   BMI 33.97 kg/m   Physical Exam  Constitutional: He is oriented to person, place, and time. He appears well-nourished.  HENT:  Head: Normocephalic.  Eyes: Conjunctivae are normal.  Cardiovascular: Normal rate.   No murmur heard. Pulmonary/Chest: Effort normal and breath sounds normal. No respiratory distress.  Abdominal: Soft.  Ostomy bag present.  Musculoskeletal:  Bilateral lower extremities with 5 out of 5 strength. Sensation intact.  Neurological: He is oriented to person, place, and time. No cranial nerve deficit.  gait steady.  Skin: Skin is warm and dry. He is not diaphoretic.  Psychiatric: He has a normal mood and affect. His behavior is normal.     ED Treatments / Results  Labs (all labs ordered are listed, but only abnormal results are displayed) Labs Reviewed  URINALYSIS, ROUTINE W REFLEX MICROSCOPIC - Abnormal; Notable for the following:       Result Value   Color, Urine STRAW (*)    All other components within normal limits    EKG  EKG Interpretation None       Radiology No results found.  Procedures Procedures (including critical care  time)  Medications Ordered in ED Medications  oxyCODONE-acetaminophen (PERCOCET/ROXICET) 5-325 MG per tablet 1 tablet (1 tablet Oral Given 08/27/16 0924)     Initial Impression / Assessment and Plan / ED Course  I have reviewed the triage vital signs and the nursing notes.  Pertinent labs & imaging results that were available during my care of the patient were reviewed by me and considered in my medical decision making (see chart for details).  Clinical Course     Patient is a 50 year old male presenting with chronic back pain. It is unclear to me exactly why patient came to emergency department. He reports he's had back pain lasted for months and he tells me that he is being seen at [facility] for metastatic colon cancer. Patient came to this emergency department because he "moved to [facility] today". We will treat his pain here in emergency department I encouraged him to follow up with  his oncologist for further pain management. Patient does not have appear to have any signs symptoms concerning for acute cord issues, he is a steady gait he is not urinating on himself, has no fever.  No red flags, no trauma. Needs to follow up with his oncologist who is already very invovled with the mets to his back.   Patient is happy with the plan to follow up with his oncologist, woulld like a bite to eat before he leaves.   Patient is comfortable, ambulatory, and taking PO at time of discharge.  Patient expressed understanding about return precautions.    Final Clinical Impressions(s) / ED Diagnoses   Final diagnoses:  Low back pain without sciatica, unspecified back pain laterality, unspecified chronicity    New Prescriptions Discharge Medication List as of 08/27/2016 10:48 AM       Rochel Privett Julio Alm, MD 08/30/16 1918

## 2016-08-27 NOTE — ED Notes (Signed)
Pt given breakfast tray

## 2016-08-27 NOTE — ED Notes (Signed)
Pt tolerated breakfast tray.  States he is feeling better and is ready to go.

## 2016-08-27 NOTE — ED Triage Notes (Signed)
Patient is alert and oriented x4.  He is complaining of back pain that is related to his rectal cancer that has spread to his back.  Patient is unable to take his pain medication due to medication policy at the group home he is at.  Currently he rates his pain 10 of 10.

## 2016-08-27 NOTE — Discharge Instructions (Signed)
We are very sorry about your back pain today. We need you to call your oncologist at [facility] and have his quick follow-up as you possibly can. Please return with any concerns, fevers, trouble walking, trouble urinating, or other concerns.

## 2016-08-27 NOTE — ED Notes (Signed)
Pt is c/o Bilateral Lower back pain 10/10 burning, stabbing that has been chronic x 3 months, however patient states that pain has worsened and is unbearable and has brought him to the ED tonight . Pt reports that he has a hx of colon Ca. that has returned. Pt states that he has his first appointment in 2 weeks with oncologist. Pt states that the Ca. Has spread according to a CT scan performed at [facility] and is the reason for his pain. Marland Kitchen

## 2016-10-07 IMAGING — CR DG CHEST 2V
2 series · 2 of 2 positions shown · non-contrast
Comparison: April 10, 2015

CLINICAL DATA: Syncope and chest pain

EXAM:
CHEST  2 VIEW

[chest pa]
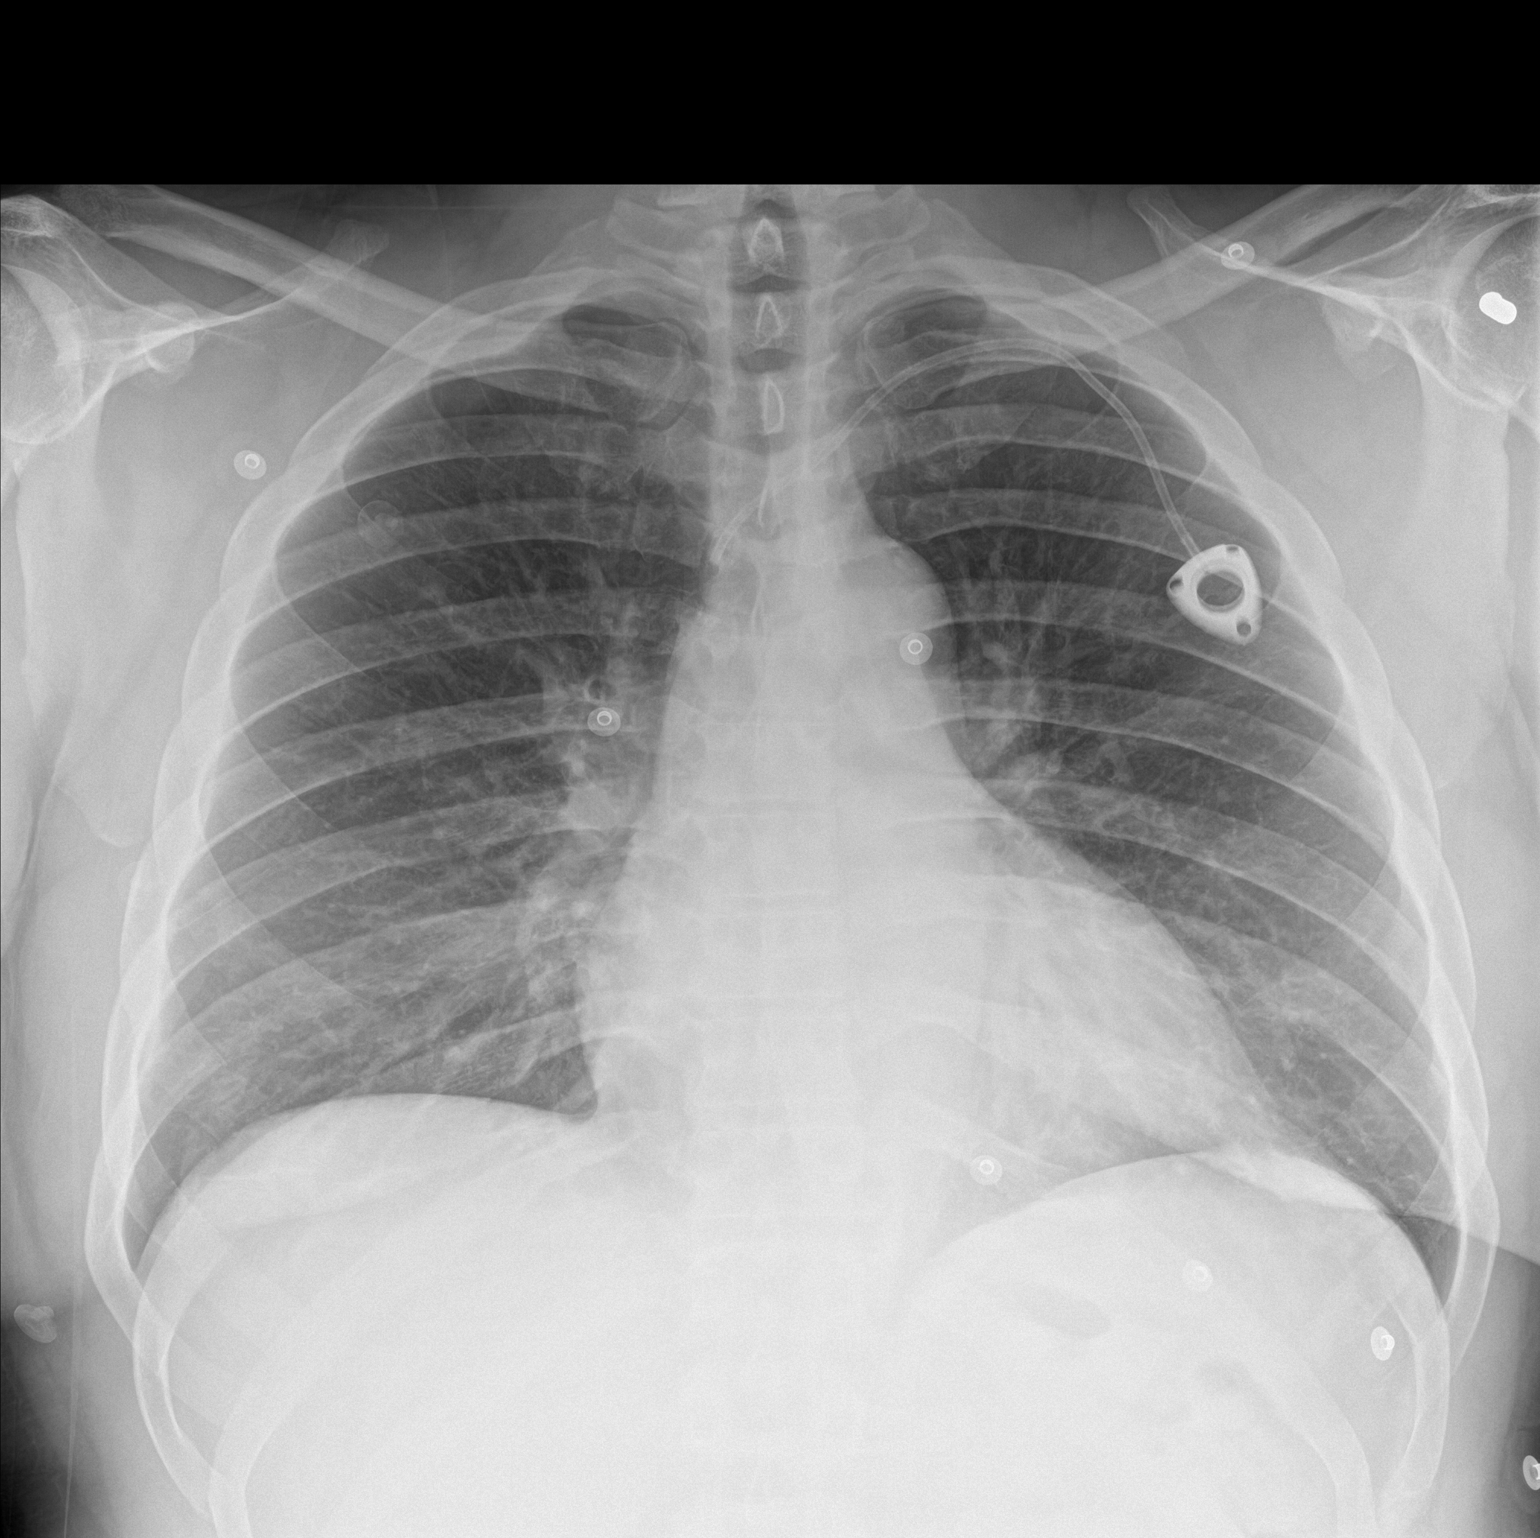

[chest lat]
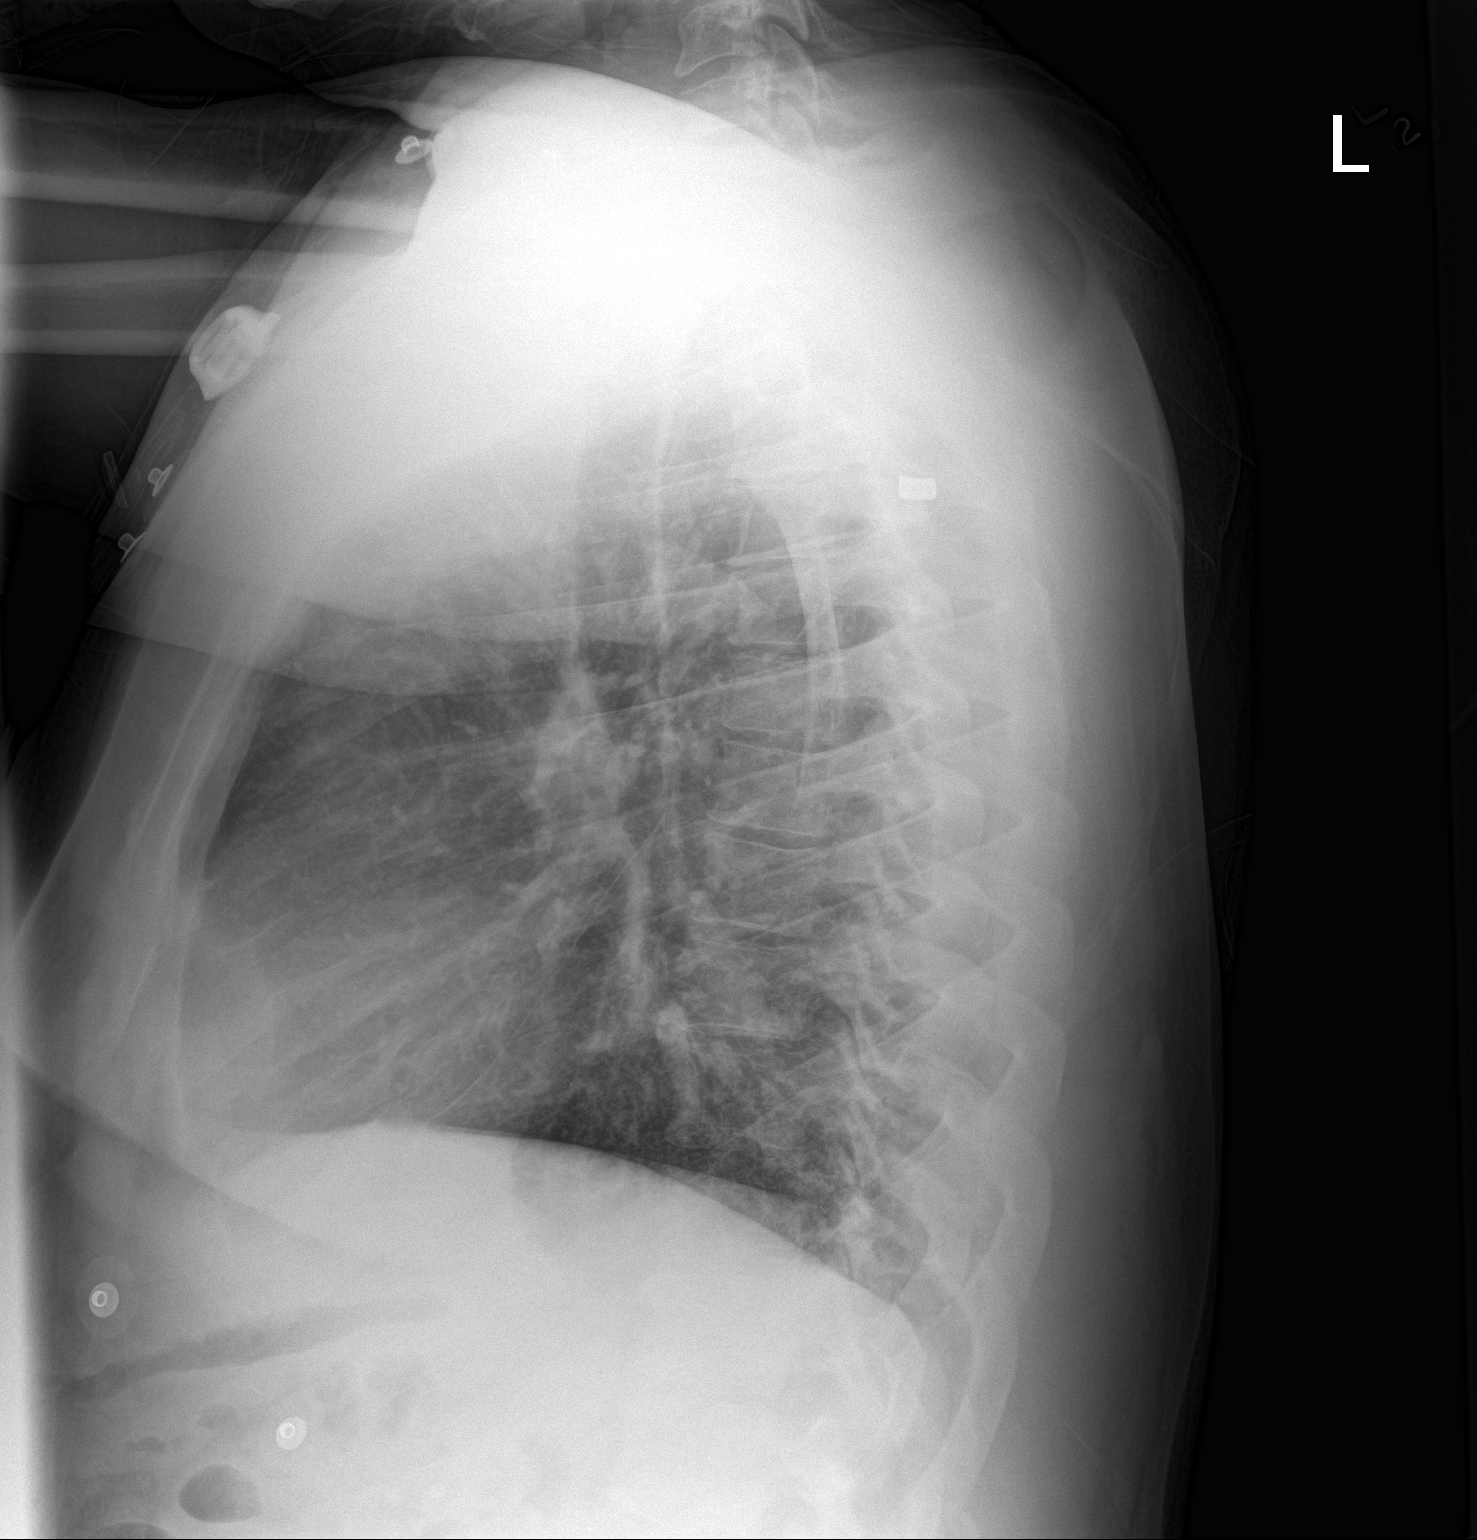

[2 of 2 positions shown; findings below may reference images not displayed]

FINDINGS: Port-A-Cath tip is in the superior vena cava slightly beyond the
left innominate vein. No pneumothorax. No edema or consolidation.
Heart size and pulmonary vascularity are normal. No adenopathy. No
bone lesions.
IMPRESSION: No edema or consolidation.

## 2016-10-31 ENCOUNTER — Encounter (HOSPITAL_COMMUNITY): Payer: Self-pay | Admitting: Emergency Medicine

## 2016-10-31 ENCOUNTER — Emergency Department (HOSPITAL_COMMUNITY)
Admission: EM | Admit: 2016-10-31 | Discharge: 2016-11-01 | Disposition: A | Discharge status: Home / Self Care | Payer: Medicaid Other | Attending: Emergency Medicine | Admitting: Emergency Medicine

## 2016-10-31 DIAGNOSIS — R45851 Suicidal ideations: Secondary | ICD-10-CM | POA: Diagnosis present

## 2016-10-31 DIAGNOSIS — Z79899 Other long term (current) drug therapy: Secondary | ICD-10-CM | POA: Insufficient documentation

## 2016-10-31 DIAGNOSIS — Z85038 Personal history of other malignant neoplasm of large intestine: Secondary | ICD-10-CM | POA: Insufficient documentation

## 2016-10-31 DIAGNOSIS — F17210 Nicotine dependence, cigarettes, uncomplicated: Secondary | ICD-10-CM | POA: Insufficient documentation

## 2016-10-31 DIAGNOSIS — F333 Major depressive disorder, recurrent, severe with psychotic symptoms: Secondary | ICD-10-CM | POA: Diagnosis not present

## 2016-10-31 LAB — COMPREHENSIVE METABOLIC PANEL
ALT: 16 U/L — ABNORMAL LOW (ref 17–63)
ANION GAP: 14 (ref 5–15)
AST: 18 U/L (ref 15–41)
Albumin: 3.1 g/dL — ABNORMAL LOW (ref 3.5–5.0)
Alkaline Phosphatase: 58 U/L (ref 38–126)
BILIRUBIN TOTAL: 0.4 mg/dL (ref 0.3–1.2)
BUN: 8 mg/dL (ref 6–20)
CHLORIDE: 106 mmol/L (ref 101–111)
CO2: 20 mmol/L — ABNORMAL LOW (ref 22–32)
Calcium: 8.7 mg/dL — ABNORMAL LOW (ref 8.9–10.3)
Creatinine, Ser: 1.33 mg/dL — ABNORMAL HIGH (ref 0.61–1.24)
Glucose, Bld: 70 mg/dL (ref 65–99)
POTASSIUM: 3.4 mmol/L — AB (ref 3.5–5.1)
Sodium: 140 mmol/L (ref 135–145)
TOTAL PROTEIN: 7.5 g/dL (ref 6.5–8.1)

## 2016-10-31 LAB — SALICYLATE LEVEL

## 2016-10-31 LAB — CBC
HCT: 32.7 % — ABNORMAL LOW (ref 39.0–52.0)
Hemoglobin: 11.1 g/dL — ABNORMAL LOW (ref 13.0–17.0)
MCH: 29.2 pg (ref 26.0–34.0)
MCHC: 33.9 g/dL (ref 30.0–36.0)
MCV: 86.1 fL (ref 78.0–100.0)
PLATELETS: 389 10*3/uL (ref 150–400)
RBC: 3.8 MIL/uL — AB (ref 4.22–5.81)
RDW: 14.6 % (ref 11.5–15.5)
WBC: 5.8 10*3/uL (ref 4.0–10.5)

## 2016-10-31 LAB — ETHANOL: ALCOHOL ETHYL (B): 29 mg/dL — AB (ref <5)

## 2016-10-31 LAB — RAPID URINE DRUG SCREEN, HOSP PERFORMED
AMPHETAMINES: NOT DETECTED
BENZODIAZEPINES: NOT DETECTED
Barbiturates: NOT DETECTED
Cocaine: POSITIVE — AB
OPIATES: NOT DETECTED
Tetrahydrocannabinol: NOT DETECTED

## 2016-10-31 LAB — ACETAMINOPHEN LEVEL

## 2016-10-31 MED ORDER — ZOLPIDEM TARTRATE 5 MG PO TABS: 5.0000 mg | ORAL_TABLET | Freq: Every evening | ORAL | Status: DC | PRN

## 2016-10-31 MED ORDER — LORAZEPAM 1 MG PO TABS: 1.0000 mg | ORAL_TABLET | Freq: Three times a day (TID) | ORAL | Status: DC | PRN

## 2016-10-31 MED ORDER — SODIUM CHLORIDE 0.9 % IV BOLUS (SEPSIS): 500.0000 mL | Freq: Once | INTRAVENOUS | Status: DC

## 2016-10-31 MED ORDER — IBUPROFEN 400 MG PO TABS
600.0000 mg | ORAL_TABLET | Freq: Three times a day (TID) | ORAL | Status: DC | PRN
  Administered 2016-10-31: 600 mg via ORAL
  Filled 2016-10-31: qty 1

## 2016-10-31 MED ORDER — ALUM & MAG HYDROXIDE-SIMETH 200-200-20 MG/5ML PO SUSP
30.0000 mL | ORAL | Status: DC | PRN
Start: 2016-10-31 — End: 2016-11-01

## 2016-10-31 MED ORDER — ACETAMINOPHEN 325 MG PO TABS: 650.0000 mg | ORAL_TABLET | ORAL | Status: DC | PRN

## 2016-10-31 MED ORDER — ONDANSETRON HCL 4 MG PO TABS: 4.0000 mg | ORAL_TABLET | Freq: Three times a day (TID) | ORAL | Status: DC | PRN

## 2016-10-31 NOTE — ED Notes (Signed)
Light brown soft stool in colostomy bag.  Pt reports he last changed bag about 8-9 days ago.  Colostomy flange and bag change; area around stoma cleaned with warm soap and water.

## 2016-10-31 NOTE — ED Notes (Signed)
Put the tts machine in the patient is resting

## 2016-10-31 NOTE — ED Notes (Signed)
Richard [name], SW, in w/pt.

## 2016-10-31 NOTE — ED Notes (Signed)
Spoke w/Bryant, SW, advised of pt needing assistance w/housing. Advised he will come down to ED in an hour or so.

## 2016-10-31 NOTE — ED Notes (Signed)
Offered to take pt to go shower stated that he would like to but that he would let us know when he was ready, RN Becky notified.
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
## 2016-10-31 NOTE — ED Notes (Signed)
Pt advised SW he is not interested in being d/c'd. States was inpt tx. SW able to obtain pts' "Peer Support" staff's numbers.

## 2016-10-31 NOTE — Progress Notes (Signed)
Patient has been referred for inpatient treatment at the following facilities: [facility] - left [employer] - per Elta Guadeloupe, fax it [facility] - accepting referrals for the waitlist [facility] - per Olivia Mackie, will look at referral, might be for waitlist  At capacity: [place] [facility] - per Richardean Canal - per [facility]  CSW will continue to seek treatment.  Verlon Setting, [place] Disposition staff 10/31/2016 10:42 AM

## 2016-10-31 NOTE — ED Notes (Signed)
Pt changed into paper scrubs and belongings removed from room. Security paged to wand pt.

## 2016-10-31 NOTE — ED Notes (Signed)
Dinner tray ordered.

## 2016-10-31 NOTE — ED Notes (Signed)
Pt wanded by security. 

## 2016-10-31 NOTE — ED Notes (Signed)
Snack and drink provided. ?

## 2016-10-31 NOTE — ED Provider Notes (Signed)
[place] DEPT Provider Note   CSN: [id number] Arrival date & time: 10/31/16  0603     History   Chief Complaint Chief Complaint  Patient presents with  . Suicidal  . Colon Cancer    HPI Richard [name] is a 51 y.o. male.  The history is provided by the patient and medical records. No language interpreter was used.   Richard [name] is a 51 y.o. male  with a PMH of metastatic colon cancer on chemotherapy with colostomy in place as well as major depressive disorder who presents to the Emergency Department complaining of suicidal ideations without plan for the last six days. Patient notes that all was well with his anxiety/depression until last week. He is working on moving to [employer] in [place], however was placed in a motel temporarily. He was told he would only have to stay in the motel for one night, however he has been there six now. He notes he does not have any of his medication, including his zoloft, which has been making his sxs worse. He also does not have any more colostomy bags and have not changed the bag in 8 days. He endorses auditory hallucinations, stating he hears tones, not words that he can understand. Denies visual hallucinations. No homicidal ideations.   Past Medical History:  Diagnosis Date  . Colon cancer ([place])   . Colon cancer ([place]) 2015  . 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 Severe episode of recurrent major depressive disorder, with psychotic features ([place])   . MDD (major depressive disorder) 11/16/2015  . Severe episode of recurrent major depressive disorder, without psychotic features ([place])   . MDD (major depressive disorder), recurrent episode, severe ([place]) 10/17/2015  . Cocaine abuse 10/17/2015  . Alcohol abuse 10/17/2015  . Severe single current episode of major depressive disorder, with psychotic features ([place])   . Substance induced mood disorder ([place])  10/08/2015  . 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 Colon cancer s/p chemo + radiation and resection. Now with colostomy bag 04/10/2015  . SVT (supraventricular tachycardia) ([place]) 04/10/2015  . Tobacco abuse 04/10/2015    Past Surgical History:  Procedure Laterality Date  . PARTIAL COLECTOMY  06/2014   done at [facility] Medications    Prior to Admission medications   Medication Sig Start Date End Date Taking? Authorizing Provider  ARIPiprazole (ABILIFY) 20 MG tablet Take 1 tablet (20 mg total) by mouth daily. Patient not taking: Reported on 08/27/2016 11/30/15   Delfin Gant, NP  FLUoxetine (PROZAC) 20 MG capsule Take 1 capsule (20 mg total) by mouth daily. Patient not taking: Reported on 08/27/2016 11/30/15   Delfin Gant, NP  hydrOXYzine (ATARAX/VISTARIL) 25 MG tablet Take 1 tablet (25 mg total) by mouth every 6 (six) hours as needed for anxiety. Patient not taking: Reported on 08/27/2016 11/30/15   Delfin Gant, NP  neomycin-bacitracin-polymyxin (NEOSPORIN) OINT Apply 1 application topically as needed for wound care. Patient not taking: Reported on 08/27/2016 11/26/15   Encarnacion Slates, NP  sertraline (ZOLOFT) 100 MG tablet Take 1 tablet by mouth daily. 08/11/16 08/11/17  Historical Provider, MD  topiramate (TOPAMAX) 50 MG tablet Take 1 tablet (50 mg total) by mouth daily. Patient not taking: Reported on 08/27/2016 11/30/15   Delfin Gant, NP  traZODone (DESYREL) 100 MG tablet Take 1 tablet (100 mg total) by mouth  at bedtime. Patient not taking: Reported on 08/27/2016 11/30/15   Delfin Gant, NP    Family History Family History  Problem Relation Age of Onset  . Coronary artery disease Cousin 68    Social History Social History  Substance Use Topics  . Smoking status: Current Every Day Smoker    Packs/day: 0.50    Types: Cigarettes  . Smokeless tobacco: Never Used  . Alcohol use 0.6 oz/week    1 Cans of beer per week     Comment: drinks 2  times a week, last drink (09/2015)     Allergies   Morphine and related   Review of Systems Review of Systems  Constitutional: Negative for chills and fever.  HENT: Negative for congestion.   Eyes: Negative for visual disturbance.  Respiratory: Negative for shortness of breath.   Cardiovascular: Negative for chest pain.  Gastrointestinal: Negative for vomiting.  Genitourinary: Negative for dysuria.  Skin: Negative for rash.  Neurological: Negative for headaches.  Psychiatric/Behavioral: Positive for suicidal ideas.     Physical Exam Updated Vital Signs BP 122/94 (BP Location: Right Arm)   Pulse 86   Temp 97.7 F (36.5 C) (Oral)   Resp 20   Ht 5\' 9"  (1.753 m)   Wt 108.9 kg   SpO2 100%   BMI 35.44 kg/m   Physical Exam  Constitutional: He is oriented to person, place, and time. He appears well-developed and well-nourished. No distress.  HENT:  Head: Normocephalic and atraumatic.  Cardiovascular: Normal rate, regular rhythm and normal heart sounds.   No murmur heard. Pulmonary/Chest: Effort normal and breath sounds normal. No respiratory distress.  Abdominal: Soft. He exhibits no distension.  Ostomy in place.   Musculoskeletal: He exhibits no edema.  Neurological: He is alert and oriented to person, place, and time.  Skin: Skin is warm and dry.  Nursing note and vitals reviewed.    ED Treatments / Results  Labs (all labs ordered are listed, but only abnormal results are displayed) Labs Reviewed  COMPREHENSIVE METABOLIC PANEL - Abnormal; Notable for the following:       Result Value   Potassium 3.4 (*)    CO2 20 (*)    Creatinine, Ser 1.33 (*)    Calcium 8.7 (*)    Albumin 3.1 (*)    ALT 16 (*)    All other components within normal limits  ETHANOL - Abnormal; Notable for the following:    Alcohol, Ethyl (B) 29 (*)    All other components within normal limits  ACETAMINOPHEN LEVEL - Abnormal; Notable for the following:    Acetaminophen (Tylenol), Serum  <10 (*)    All other components within normal limits  CBC - Abnormal; Notable for the following:    RBC 3.80 (*)    Hemoglobin 11.1 (*)    HCT 32.7 (*)    All other components within normal limits  RAPID URINE DRUG SCREEN, HOSP PERFORMED - Abnormal; Notable for the following:    Cocaine POSITIVE (*)    All other components within normal limits  SALICYLATE LEVEL    EKG  EKG Interpretation None       Radiology No results found.  Procedures Procedures (including critical care time)  Medications Ordered in ED Medications - No data to display   Initial Impression / Assessment and Plan / ED Course  I have reviewed the triage vital signs and the nursing notes.  Pertinent labs & imaging results that were available during my care of the  patient were reviewed by me and considered in my medical decision making (see chart for details).    Noris Hohman is a 51 y.o. male who presents to ED for suicidal ideations. Per TTS consultation, patient meets inpatient criteria. Patient does have hx of colon cancer currently receiving chemo and has ostomy bag in place which has not been changed recently. Will get new ostomy bag while in ED today. Labs reviewed. Orally hydrated during stay. Given hx of ETOH use and ETOH of 29, CIWA in place. Patient awaiting inpatient psych placement.    Final Clinical Impressions(s) / ED Diagnoses   Final diagnoses:  None    New Prescriptions New Prescriptions   No medications on file     [employer] Ward, PA-C 10/31/16 1333    Duffy Bruce, MD 10/31/16 2112

## 2016-10-31 NOTE — ED Notes (Addendum)
Pt arrived to [facility] - ambulatory wearing burgundy scrubs. Pt verbalized understanding and signed Medical Clearance Pt Policy form - copy given to pt and original placed on clipboard. States he was living in [facility] in [place] and his "Peer Support" person paid for him to stay in a hotel - was supposed to only be for 1 night. States support person was changed to someone else and he ended up being there for 8 days. States first peer support person was supposed to be helping him get into the [facility] in [place]. States receives chemo tx's in [place]. States has had 1 tx and missed appt for next one. When asked if SI, states "I'm just here". States was in an inpt facility 6-8 months ago. States last used cocaine x 2 days ago - "2-3 lines" and smoked marijuana. States last ETOH use was 2 days ago. Advised pt Ethanol level was positive - states "Well, I drank 1 tall beer over 2 days and took the last sips this morning". Pt noted to be calm, cooperative. Body odor noted. Offered for pt to shower when he is ready.

## 2016-10-31 NOTE — ED Triage Notes (Signed)
Pt resident of an [facility] in [place], but receives chemo tx for colon cancer "in [place]" from "Dr. Vira Agar" (unsure if at [facility] or where). Was attempting to move to an [facility] in [place] to be able to make his appts, but unable to. Ended up in a motel room in [place], ran out of [place] meds and anxiety/depression began to "flare up" about 4 days ago. Endorses SI w/out plan. +AH

## 2016-10-31 NOTE — ED Notes (Signed)
Patient is resting comfortably. 

## 2016-10-31 NOTE — ED Notes (Addendum)
Pt given Ibuprofen as requested for bil lower back pain - states "tumor in my intestines is pushing against my arteries" - as he points toward lowered back w/each hand. States is a smoker but declines Nicotine Patch.

## 2016-10-31 NOTE — Clinical Social Work Note (Signed)
CSW met with patient at bedside per RN's request. CSW notes that the patient has been recommended for placement and [facility] disposition CSW has begun making referrals. The patient shares that he has had a very rough two weeks. The patient was staying in an [facility] and was to be moved to a new [employer] in [place], but this plan fell through when his peer support specialist suddenly changed. The patient shares that he has not met the new peer support specialist but has his number. The patient states that the old peer support specialist has the majority of his belongings in his car and has made multiple attempts to reach the old peer support specialist. The patient has presented with suicidal ideation and still endoreses thoughts of suicide and auditory hallucinations at time of CSW visit. The patient states that he would still want to commit suicide even if we could "fix all of his problems." The patient also voices frustrations with being off of his medications for a week and a half and having no colostomy supplies. CSW was able to retrieve numbers to the patient's former and current peer support specialists through "[facility]". The patient states that he has been to [employer] in the past and says that they helped him greatly in the past. Contact information list below for both peer support specialists:  Monica Martinez: [phone] [facility]: [phone]   Liz Beach MSW, [place], [place], [phone]

## 2016-10-31 NOTE — ED Notes (Addendum)
2 labeled belongings bags and blue duffle bag w/label - placed at nurses' desk for inventory. Offered for pt to shower.

## 2016-10-31 NOTE — ED Notes (Signed)
ALL belongings inventoried - 3 labeled belongings bags placed in 1 container and blue duffle bag beside of it. Pt w/ostomy supplies in one of the belongings bags. Valuables envelope to security. No money, ID, or wallet noted - pt signed - verifying items inventoried.

## 2016-11-01 MED ORDER — POTASSIUM CHLORIDE CRYS ER 20 MEQ PO TBCR
40.0000 meq | EXTENDED_RELEASE_TABLET | Freq: Once | ORAL | Status: AC
  Administered 2016-11-01: 40 meq via ORAL
  Filled 2016-11-01: qty 2

## 2016-11-01 MED ORDER — IBUPROFEN 400 MG PO TABS
600.0000 mg | ORAL_TABLET | Freq: Three times a day (TID) | ORAL | Status: DC | PRN
  Administered 2016-11-01: 600 mg via ORAL
  Filled 2016-11-01: qty 1

## 2016-11-01 NOTE — ED Notes (Signed)
Pt had advised SW he wants to be placed back on his meds. Verified w/Paige, [facility] - pt will need to follow up w/his Peer Support team.

## 2016-11-01 NOTE — ED Notes (Signed)
Spoke w/ EDPA, Elmyra Ricks, to modify ibuprofen order to be given for pain as well, stated she would modify the order.

## 2016-11-01 NOTE — ED Notes (Signed)
Pt received breakfast tray 

## 2016-11-01 NOTE — BHH Counselor (Addendum)
Pt is cooperative and oriented x 4 during reassessment. His affect is blunted. He currently denies SI and denies plan or intent. Pt says, "I'm in a real bad depression." He denies HI and no delusions noted. Pt endorses AH which he describes as "garbled chit chat". Pt denies having command AH. He reports that he has "a lot of bad luck and bad luck." He report he slept fairly well last night and his appetite is good. Pt reports having a peer support specialist through [place]. He reports moderate anxiety. Per pt's RN Jacqlyn Larsen, pt denied SI prior to reassessment.  Writer ran pt by Jinny Blossom NP who recommends pt be discharged to follow up with [facility]. [facility] Attending Merian Capron in agreement with disposition.  Arnold Long, [place] Therapeutic Triage Specialist \

## 2016-11-01 NOTE — ED Notes (Addendum)
Pt given [employer] bus pass and PART bus - from [place], [place].

## 2016-11-01 NOTE — ED Notes (Signed)
Richard [name], SW, in w/pt.

## 2016-11-01 NOTE — ED Notes (Signed)
Pt refusing to be d/c'd - requested to speak w/"Supervisor". Revonda Humphrey, Agricultural consultant, spoke w/pt. ALL belongings - 3 labeled belongings bags, blue duffle bag, and valuables envelope given to - pt signed verifying all items present.

## 2016-11-01 NOTE — ED Notes (Signed)
Dr Jacubowitz in w/pt. 

## 2016-11-01 NOTE — ED Provider Notes (Addendum)
Sleeping comfortably. Stable. Oral potassium supplementation ordered   Orlie Dakin, MD 11/01/16 [id number] Patient cleared by psychiatry for discharge. He is alert and appropriate.presently reports vague thnoughts of suicide but doesn't not feel that he will act out on it. He is invited to call 911 if he thinks that he may harm himself. He has psychiatric follow-up as outpatient   Orlie Dakin, MD 11/01/16 1032

## 2016-11-01 NOTE — Discharge Instructions (Signed)
If you think that you might harm yourself or someone else, call 911 otherwise follow up with your mental health worker

## 2016-11-19 ENCOUNTER — Encounter (HOSPITAL_COMMUNITY): Payer: Self-pay | Admitting: *Deleted

## 2016-11-19 ENCOUNTER — Emergency Department (HOSPITAL_COMMUNITY)
Admission: EM | Admit: 2016-11-19 | Discharge: 2016-11-19 | Disposition: A | Discharge status: Other Acute Inpatient Hospital | Payer: Medicaid Other | Attending: Emergency Medicine | Admitting: Emergency Medicine

## 2016-11-19 ENCOUNTER — Emergency Department (HOSPITAL_COMMUNITY): Payer: Medicaid Other

## 2016-11-19 DIAGNOSIS — F17210 Nicotine dependence, cigarettes, uncomplicated: Secondary | ICD-10-CM | POA: Diagnosis not present

## 2016-11-19 DIAGNOSIS — Z79899 Other long term (current) drug therapy: Secondary | ICD-10-CM | POA: Insufficient documentation

## 2016-11-19 DIAGNOSIS — K529 Noninfective gastroenteritis and colitis, unspecified: Secondary | ICD-10-CM | POA: Diagnosis not present

## 2016-11-19 DIAGNOSIS — Z85038 Personal history of other malignant neoplasm of large intestine: Secondary | ICD-10-CM | POA: Diagnosis not present

## 2016-11-19 DIAGNOSIS — K625 Hemorrhage of anus and rectum: Secondary | ICD-10-CM | POA: Diagnosis present

## 2016-11-19 HISTORY — DX: Suicidal ideations: R45.851

## 2016-11-19 HISTORY — DX: Major depressive disorder, single episode, unspecified: F32.9

## 2016-11-19 HISTORY — DX: Depression, unspecified: F32.A

## 2016-11-19 LAB — URINALYSIS, ROUTINE W REFLEX MICROSCOPIC
BILIRUBIN URINE: NEGATIVE
Bacteria, UA: NONE SEEN
GLUCOSE, UA: NEGATIVE mg/dL
Ketones, ur: NEGATIVE mg/dL
LEUKOCYTES UA: NEGATIVE
NITRITE: NEGATIVE
PH: 5 (ref 5.0–8.0)
Protein, ur: NEGATIVE mg/dL
SPECIFIC GRAVITY, URINE: 1.016 (ref 1.005–1.030)

## 2016-11-19 LAB — RAPID URINE DRUG SCREEN, HOSP PERFORMED
Amphetamines: NOT DETECTED
BARBITURATES: NOT DETECTED
Benzodiazepines: NOT DETECTED
Cocaine: POSITIVE — AB
Opiates: NOT DETECTED
Tetrahydrocannabinol: POSITIVE — AB

## 2016-11-19 LAB — COMPREHENSIVE METABOLIC PANEL
ALK PHOS: 51 U/L (ref 38–126)
ALT: 12 U/L — AB (ref 17–63)
AST: 17 U/L (ref 15–41)
Albumin: 3.7 g/dL (ref 3.5–5.0)
Anion gap: 6 (ref 5–15)
BUN: 13 mg/dL (ref 6–20)
CALCIUM: 8.6 mg/dL — AB (ref 8.9–10.3)
CO2: 24 mmol/L (ref 22–32)
CREATININE: 0.74 mg/dL (ref 0.61–1.24)
Chloride: 106 mmol/L (ref 101–111)
Glucose, Bld: 108 mg/dL — ABNORMAL HIGH (ref 65–99)
Potassium: 3.6 mmol/L (ref 3.5–5.1)
Sodium: 136 mmol/L (ref 135–145)
Total Bilirubin: 0.6 mg/dL (ref 0.3–1.2)
Total Protein: 6.9 g/dL (ref 6.5–8.1)

## 2016-11-19 LAB — CBC WITH DIFFERENTIAL/PLATELET
Basophils Absolute: 0 10*3/uL (ref 0.0–0.1)
Basophils Relative: 0 %
EOS ABS: 0 10*3/uL (ref 0.0–0.7)
Eosinophils Relative: 0 %
HEMATOCRIT: 34 % — AB (ref 39.0–52.0)
HEMOGLOBIN: 10.8 g/dL — AB (ref 13.0–17.0)
LYMPHS ABS: 1.7 10*3/uL (ref 0.7–4.0)
Lymphocytes Relative: 30 %
MCH: 28.7 pg (ref 26.0–34.0)
MCHC: 31.8 g/dL (ref 30.0–36.0)
MCV: 90.4 fL (ref 78.0–100.0)
MONO ABS: 0.4 10*3/uL (ref 0.1–1.0)
MONOS PCT: 8 %
Neutro Abs: 3.4 10*3/uL (ref 1.7–7.7)
Neutrophils Relative %: 62 %
Platelets: 350 10*3/uL (ref 150–400)
RBC: 3.76 MIL/uL — ABNORMAL LOW (ref 4.22–5.81)
RDW: 16.9 % — ABNORMAL HIGH (ref 11.5–15.5)
WBC: 5.5 10*3/uL (ref 4.0–10.5)

## 2016-11-19 LAB — ACETAMINOPHEN LEVEL: Acetaminophen (Tylenol), Serum: <10 ug/mL — ABNORMAL LOW (ref 10–30)

## 2016-11-19 LAB — ETHANOL

## 2016-11-19 LAB — I-STAT CG4 LACTIC ACID, ED: LACTIC ACID, VENOUS: 0.9 mmol/L (ref 0.5–1.9)

## 2016-11-19 LAB — SALICYLATE LEVEL

## 2016-11-19 MED ORDER — PIPERACILLIN-TAZOBACTAM 3.375 G IVPB 30 MIN
3.3750 g | Freq: Once | INTRAVENOUS | Status: AC
  Administered 2016-11-19: 3.375 g via INTRAVENOUS
  Filled 2016-11-19: qty 50

## 2016-11-19 MED ORDER — MORPHINE SULFATE (PF) 4 MG/ML IV SOLN
6.0000 mg | Freq: Once | INTRAVENOUS | Status: AC
  Administered 2016-11-19: 6 mg via INTRAVENOUS
  Filled 2016-11-19: qty 2

## 2016-11-19 MED ORDER — IOPAMIDOL (ISOVUE-300) INJECTION 61%
INTRAVENOUS | Status: AC
  Filled 2016-11-19: qty 100

## 2016-11-19 MED ORDER — SODIUM CHLORIDE 0.9 % IV BOLUS (SEPSIS)
1000.0000 mL | Freq: Once | INTRAVENOUS | Status: AC
  Administered 2016-11-19: 1000 mL via INTRAVENOUS

## 2016-11-19 NOTE — ED Notes (Signed)
Transported to CT 

## 2016-11-19 NOTE — Progress Notes (Signed)
Pt. Is a 51 year-old, male who came to the ED with SI and plan to shoot himself.  His case manager called him and he decided to come to the hospital.   [facility] declined him because of the need to medically manage a colostomy bag.  CSW made referrals out for bed placements to the following: [facility], [facility], [facility], [facility], [place], [place], [place], [place], [place], [place], [place] [facility] Judi Cong, MSW, [place] Work Disposition [phone]

## 2016-11-19 NOTE — ED Notes (Signed)
Dr. Gershon Crane at bedside.;

## 2016-11-19 NOTE — ED Notes (Signed)
Patient states he is feeling a little better. Sitter at bedside. Clothing inventoried. Debt card and phone locked up in security. Gray sweat pants , gray hoodie, white sleeveless T-shirt, 1 pr. Black and orange tennis shoes.

## 2016-11-19 NOTE — ED Notes (Signed)
TTS IN PROSGRESS

## 2016-11-19 NOTE — ED Triage Notes (Signed)
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 Dx. 3 years ago. Denies vomiting. States he had an appointment with his MD in [place] yes however he missed it. Also has an appointment today. States he feels like hurting himself today and also feels like that everyday.

## 2016-11-19 NOTE — BH Assessment (Signed)
Tele Assessment Note   Richard [name] is an 51 y.o. male who came to the ED with thoughts of hurting himself with a gun. He states that he is staying with friends at the moment and his friend has a gun at the house. His friend left and pt got the gun out and was contemplating killing himself but his case manager called him and he states that he decided to go to the hospital instead. He states that he had a similar incident 5 months ago where he put a gun up to his head and pulled the trigger but it "clicked" and didn't go off. Pt was hyperverbal during assessment and states that he "needs help". He states that he has been inpatient several times but wants to "break the cycle" and get the support he needs. He states that he currently has colon cancer and a colostomy bag. He states that he doesn't feel like "a whole person" because of these complications. He does not have a lot of family support and states that "life isn't worth living if he continues to feel this depressed." Pt states that he has been using cocaine and marijuana to cope with the pain. He has a history of substance use disorder before he found out he had cancer 3 years ago. Pt was tearful during assessment and states that if he is sent outpatient he "won't make it because he will kill himself." No HI noted, history of hearing voices but does not state he is hearing them today.   Diagnosis: Major Depressive Disorder, recurrent severe, cocaine use disorder, severe, cannabis use disorder severe   Past Medical History:  Past Medical History:  Diagnosis Date  . Colon cancer ([place])   . Colon cancer ([place]) 2015  . Colostomy complication ([place]) 79-89-21   blood in colostomy bag  . Depression   . Suicidal ideation     Past Surgical History:  Procedure Laterality Date  . PARTIAL COLECTOMY  06/2014   done at [facility]    Family History:  Family History  Problem Relation Age of Onset  . Coronary artery disease Cousin 72    Social History:   reports that he has been smoking Cigarettes.  He has been smoking about 0.50 packs per day. He has never used smokeless tobacco. He reports that he drinks about 0.6 oz of alcohol per week . He reports that he uses drugs, including Marijuana and Cocaine.  Additional Social History:  Alcohol / Drug Use History of alcohol / drug use?: Yes Substance #1 Name of Substance 1: Cocaine, Marijuana  1 - Age of First Use: unk 1 - Amount (size/oz): variable 1 - Frequency: unk 1 - Duration: unk 1 - Last Use / Amount: yesterday  CIWA: CIWA-Ar BP: 101/70 Pulse Rate: 76 COWS:    PATIENT STRENGTHS: (choose at least two) Average or above average intelligence Motivation for treatment/growth  Allergies:  Allergies  Allergen Reactions  . Morphine And Related Itching    Home Medications:  (Not in a hospital admission)  OB/GYN Status:  No LMP for male patient.  General Assessment Data Location of Assessment: [place] [facility] ED TTS Assessment: In system Is this a Tele or Face-to-Face Assessment?: Tele Assessment Is this an Initial Assessment or a Re-assessment for this encounter?: Initial Assessment Marital status: Single Living Arrangements: Non-relatives/Friends Can pt return to current living arrangement?: Yes Admission Status: Voluntary Is patient capable of signing voluntary admission?: Yes Referral Source: Self/Family/Friend Insurance type:  (Medicaid)     Crisis Care  Plan Living Arrangements: Non-relatives/Friends Name of Psychiatrist: [place] Name of Therapist: [place]  Education Status Is patient currently in school?: No  Risk to self with the past 6 months Suicidal Ideation: Yes-Currently Present Has patient been a risk to self within the past 6 months prior to admission? : Yes Suicidal Intent: Yes-Currently Present Has patient had any suicidal intent within the past 6 months prior to admission? : Yes Is patient at risk for suicide?: Yes Suicidal Plan?:  Yes-Currently Present Has patient had any suicidal plan within the past 6 months prior to admission? : Yes Specify Current Suicidal Plan: shoot self in the head Access to Means: Yes Specify Access to Suicidal Means: access to guns What has been your use of drugs/alcohol within the last 12 months?: cocaine and marijuana  Previous Attempts/Gestures: Yes How many times?: 1 Triggers for Past Attempts:  (Physical decline) Intentional Self Injurious Behavior: None Family Suicide History:  (2 cousins) Recent stressful life event(s): Recent negative physical changes Persecutory voices/beliefs?: No Depression: Yes Depression Symptoms: Despondent, Isolating, Loss of interest in usual pleasures, Feeling worthless/self pity Substance abuse history and/or treatment for substance abuse?: Yes Suicide prevention information given to non-admitted patients: Not applicable  Risk to Others within the past 6 months Homicidal Ideation: No Does patient have any lifetime risk of violence toward others beyond the six months prior to admission? : No Thoughts of Harm to Others: No Current Homicidal Intent: No Current Homicidal Plan: No Access to Homicidal Means: No History of harm to others?: No Assessment of Violence: None Noted Does patient have access to weapons?: No Criminal Charges Pending?: No Does patient have a court date: No Is patient on probation?: No  Psychosis Hallucinations: Auditory Delusions: None noted  Mental Status Report Appearance/Hygiene: Unremarkable Eye Contact: Fair Motor Activity: Unremarkable Speech: Tangential Level of Consciousness: Alert Mood: Depressed Affect: Depressed Anxiety Level: Moderate Thought Processes: Coherent Judgement: Partial Orientation: Person, Place, Time, Situation Obsessive Compulsive Thoughts/Behaviors: None  Cognitive Functioning Concentration: Fair Memory: Recent Intact, Remote Intact IQ: Average Insight: Fair Impulse Control:  Fair Appetite: Fair Weight Loss: 0 Weight Gain: 0 Sleep: Decreased Total Hours of Sleep: 5 Vegetative Symptoms: None  ADLScreening [facility] Assessment Services) Patient's cognitive ability adequate to safely complete daily activities?: Yes Patient able to express need for assistance with ADLs?: Yes Independently performs ADLs?: Yes (appropriate for developmental age)  Prior Inpatient Therapy Prior Inpatient Therapy: Yes Prior Therapy Dates: 2017-2018 Prior Therapy Facilty/Provider(s): [facility], [place] Reason for Treatment: Depression, SA   Prior Outpatient Therapy Prior Outpatient Therapy: Yes Prior Therapy Dates: ongoing Prior Therapy Facilty/Provider(s): [facility] Reason for Treatment: Depression, SA Does patient have an ACCT team?: No Does patient have Intensive In-House Services?  : No Does patient have [facility] services? : No Does patient have P4CC services?: No  ADL Screening (condition at time of admission) Patient's cognitive ability adequate to safely complete daily activities?: Yes Is the patient deaf or have difficulty hearing?: No Does the patient have difficulty seeing, even when wearing glasses/contacts?: No Does the patient have difficulty concentrating, remembering, or making decisions?: No Patient able to express need for assistance with ADLs?: Yes Does the patient have difficulty dressing or bathing?: No Independently performs ADLs?: Yes (appropriate for developmental age) Does the patient have difficulty walking or climbing stairs?: No Weakness of Legs: None Weakness of Arms/Hands: None  Home Assistive Devices/Equipment Home Assistive Devices/Equipment: None  Therapy Consults (therapy consults require a physician order) PT Evaluation Needed: No OT  Evalulation Needed: No SLP Evaluation Needed: No Abuse/Neglect Assessment (Assessment to be complete while patient is alone) Physical Abuse: Denies Verbal Abuse: Yes, past (Comment) (as a  child) Sexual Abuse: Denies Exploitation of patient/patient's resources: Denies Self-Neglect: Denies Values / Beliefs Cultural Requests During Hospitalization: None Spiritual Requests During Hospitalization: None Consults Spiritual Care Consult Needed: No Social Work Consult Needed: No Regulatory affairs officer (For Healthcare) Does Patient Have a Medical Advance Directive?: No Nutrition Screen- [facility] Adult/WL/AP Patient's home diet: Regular Has the patient recently lost weight without trying?: No Has the patient been eating poorly because of a decreased appetite?: No Malnutrition Screening Tool Score: 0  Additional Information 1:1 In Past 12 Months?: No CIRT Risk: No Elopement Risk: No Does patient have medical clearance?: Yes     Disposition:  Disposition Initial Assessment Completed for this Encounter: Yes Disposition of Patient: Inpatient treatment program Type of inpatient treatment program: Adult  Nishawn Rotan 11/19/2016 1:50 PM

## 2016-11-19 NOTE — ED Notes (Signed)
Returned from CT.

## 2016-11-19 NOTE — ED Notes (Signed)
[facility] arrived  

## 2016-11-19 NOTE — ED Provider Notes (Signed)
[facility] DEPT Provider Note   CSN: [id number] Arrival date & time: 11/19/16  0946     History   Chief Complaint Chief Complaint  Patient presents with  . Rectal Bleeding    HPI Richard [name] is a 51 y.o. male.  HPI Arrives concerned about rectal symptoms Patient states he is a colon cancer patient and have metastatic cancer as a result of this, for which he takes oxycodone The symptoms are at baseline as are his mental health issues, which patient states he feels bad but not acutely suicidal or with a plan Patient states however that he presents to the emergency department today because of discharge from his rectum and spasm-like pain Symptoms initiated 1 week ago Patient has a colostomy secondary to the colon cancer and he has noted that the stool appears slightly more liquid but no frank diarrhea, rectal blood, melena Patient is not nauseous and has not been vomiting, he denies any abdominal pain, fevers, He does state that he has some increase in his rectal pain when he urinates, no pain with ejaculation He states the rectal pain appears to be severe and associated with some clear, mucous-like discharge without stool The pain is severe and worsened with urination  Past Medical History:  Diagnosis Date  . Colon cancer ([place])   . Colon cancer ([place]) 2015  . Colostomy complication ([place]) 40-98-11   blood in colostomy bag  . Depression   . Suicidal ideation     Patient Active Problem List   Diagnosis Date Noted  . Severe episode of recurrent major depressive disorder, with psychotic features ([place])   . MDD (major depressive disorder) 11/16/2015  . Severe episode of recurrent major depressive disorder, without psychotic features ([place])   . MDD (major depressive disorder), recurrent episode, severe ([place]) 10/17/2015  . Cocaine abuse 10/17/2015  . Alcohol abuse 10/17/2015  . Severe single current episode of major depressive disorder, with psychotic features ([place])   .  Substance induced mood disorder ([place]) 10/08/2015  . Colostomy complication ([place])   . Colon cancer s/p chemo + radiation and resection. Now with colostomy bag 04/10/2015  . SVT (supraventricular tachycardia) ([place]) 04/10/2015  . Tobacco abuse 04/10/2015    Past Surgical History:  Procedure Laterality Date  . PARTIAL COLECTOMY  06/2014   done at [facility] Medications    Prior to Admission medications   Medication Sig Start Date End Date Taking? Authorizing Provider  lisinopril (PRINIVIL,ZESTRIL) 10 MG tablet Take 10 mg by mouth daily. 09/24/16  Yes Historical Provider, MD  oxyCODONE (OXYCONTIN) 20 mg 12 hr tablet Take 20 mg by mouth every 12 (twelve) hours.   Yes Historical Provider, MD  sertraline (ZOLOFT) 100 MG tablet Take 100 mg by mouth daily.  08/11/16 08/11/17 Yes Historical Provider, MD  ARIPiprazole (ABILIFY) 20 MG tablet Take 1 tablet (20 mg total) by mouth daily. Patient not taking: Reported on 08/27/2016 11/30/15   Delfin Gant, NP  FLUoxetine (PROZAC) 20 MG capsule Take 1 capsule (20 mg total) by mouth daily. Patient not taking: Reported on 08/27/2016 11/30/15   Delfin Gant, NP  hydrOXYzine (ATARAX/VISTARIL) 25 MG tablet Take 1 tablet (25 mg total) by mouth every 6 (six) hours as needed for anxiety. Patient not taking: Reported on 08/27/2016 11/30/15   Delfin Gant, NP  topiramate (TOPAMAX) 50 MG tablet Take 1 tablet (50 mg total) by mouth daily. Patient not taking: Reported on 08/27/2016 11/30/15   Wynona Meals  Cleatrice Burke, NP  traZODone (DESYREL) 100 MG tablet Take 1 tablet (100 mg total) by mouth at bedtime. Patient not taking: Reported on 08/27/2016 11/30/15   Delfin Gant, NP    Family History Family History  Problem Relation Age of Onset  . Coronary artery disease Cousin 62    Social History Social History  Substance Use Topics  . Smoking status: Current Every Day Smoker    Packs/day: 0.50    Types: Cigarettes  . Smokeless tobacco:  Never Used  . Alcohol use 0.6 oz/week    1 Cans of beer per week     Comment: drinks 2 times a week, last drink (10/2015)     Allergies   Morphine and related   Review of Systems Review of Systems  Constitutional: Negative for fever.  Respiratory: Negative for shortness of breath.   Cardiovascular: Negative for chest pain.  Gastrointestinal: Positive for diarrhea. Negative for abdominal pain, blood in stool and vomiting.  Allergic/Immunologic: Negative for immunocompromised state.  All other systems reviewed and are negative.    Physical Exam Updated Vital Signs BP 103/80 (BP Location: Right Arm)   Pulse 80   Temp 98.1 F (36.7 C) (Oral)   Resp 16   SpO2 99%   Physical Exam  Constitutional: He appears well-developed and well-nourished. No distress.  HENT:  Head: Normocephalic and atraumatic.  Left Ear: External ear normal.  Eyes: Conjunctivae are normal. Pupils are equal, round, and reactive to light. Right eye exhibits no discharge. Left eye exhibits no discharge.  Neck: Normal range of motion. Neck supple.  Cardiovascular: Normal rate and regular rhythm.   No murmur heard. Pulmonary/Chest: Effort normal and breath sounds normal. No respiratory distress.  Abdominal: Soft. Bowel sounds are normal. He exhibits no distension and no mass. There is no tenderness. There is no rebound and no guarding. No hernia.  Colostomy bag without melena, semi-solid stool that is light brown No cvat  Genitourinary:  Genitourinary Comments: Prostate is bogy, extremely tender No rectal or perirectal fluctuant mass Minimal mucous like discharge from rectum that is clear  Musculoskeletal: He exhibits no edema.  Neurological: He is alert.  Skin: Skin is warm. He is not diaphoretic.  Psychiatric: He has a normal mood and affect.  Nursing note and vitals reviewed.    ED Treatments / Results  Labs (all labs ordered are listed, but only abnormal results are displayed) Labs Reviewed    URINALYSIS, ROUTINE W REFLEX MICROSCOPIC - Abnormal; Notable for the following:       Result Value   APPearance HAZY (*)    Hgb urine dipstick SMALL (*)    Squamous Epithelial / LPF 0-5 (*)    All other components within normal limits  CBC WITH DIFFERENTIAL/PLATELET - Abnormal; Notable for the following:    RBC 3.76 (*)    Hemoglobin 10.8 (*)    HCT 34.0 (*)    RDW 16.9 (*)    All other components within normal limits  COMPREHENSIVE METABOLIC PANEL - Abnormal; Notable for the following:    Glucose, Bld 108 (*)    Calcium 8.6 (*)    ALT 12 (*)    All other components within normal limits  ACETAMINOPHEN LEVEL - Abnormal; Notable for the following:    Acetaminophen (Tylenol), Serum <10 (*)    All other components within normal limits  RAPID URINE DRUG SCREEN, HOSP PERFORMED - Abnormal; Notable for the following:    Cocaine POSITIVE (*)    Tetrahydrocannabinol POSITIVE (*)  All other components within normal limits  URINE CULTURE  CULTURE, BLOOD (ROUTINE X 2)  CULTURE, BLOOD (ROUTINE X 2)  ETHANOL  SALICYLATE LEVEL  I-STAT CG4 LACTIC ACID, ED  I-STAT CG4 LACTIC ACID, ED    EKG  EKG Interpretation None       Radiology Ct Abdomen Pelvis W Contrast  Result Date: 11/19/2016 CLINICAL DATA:  Rectal pain for 1 week. Diarrhea. Colostomy related to colon cancer. EXAM: CT ABDOMEN AND PELVIS WITH CONTRAST TECHNIQUE: Multidetector CT imaging of the abdomen and pelvis was performed using the standard protocol following bolus administration of intravenous contrast. CONTRAST:  100 cc Isovue-300 IV COMPARISON:  09/17/2015 FINDINGS: Lower chest: Lung bases are clear. No effusions. Heart is normal size. Hepatobiliary: Fatty infiltration of the liver. Irregular low-density noted in the medial segment of the left hepatic lobe, increased since prior study. This could reflect focal fatty infiltration although thrombosed portal vein or focal liver lesion cannot be excluded. Gallbladder contracted,  grossly unremarkable. Pancreas: No focal abnormality or ductal dilatation. Spleen: No focal abnormality.  Normal size. Adrenals/Urinary Tract: Bilateral renal cysts. No hydronephrosis. Adrenal glands and urinary bladder unremarkable. Stomach/Bowel: Postoperative changes from partial colectomy. Left lower quadrant ostomy noted and Hartmann's pouch in the pelvis. Gas and fluid collection with irregular thick walls noted just superior to the suture line at the [facility] pouch measuring 3.4 x 2.7 cm. This is new since prior study. This is worrisome for dehiscence and abscess. Stomach and small bowel decompressed, unremarkable. Vascular/Lymphatic: Aortic and iliac calcifications. No aneurysm or adenopathy. Reproductive: No visible focal abnormality. Other: No free fluid or free air.  No acute bony abnormality. Musculoskeletal: No acute bony abnormality. IMPRESSION: Prior partial colectomy with left lower quadrant ostomy. Thick walled gas and fluid collection noted just superior to the suture line in the Hartmann's pouch/remaining sigmoid colon concerning for dehiscence and adjacent abscess formation. Irregular low-density in the medial segment of the left hepatic lobe. Differential considerations would include focal fatty infiltration, thrombosed portal vein branch, or focal liver lesion. Recommend further evaluation with liver MRI. Electronically Signed   By: Rolm Baptise M.D.   On: 11/19/2016 12:36    Procedures Procedures (including critical care time)  Medications Ordered in ED Medications  iopamidol (ISOVUE-300) 61 % injection (not administered)  piperacillin-tazobactam (ZOSYN) IVPB 3.375 g (3.375 g Intravenous New Bag/Given 11/19/16 1530)  morphine 4 MG/ML injection 6 mg (6 mg Intravenous Given 11/19/16 1037)  sodium chloride 0.9 % bolus 1,000 mL (0 mLs Intravenous Stopped 11/19/16 1309)     Initial Impression / Assessment and Plan / ED Course  I have reviewed the triage vital signs and the nursing  notes.  Pertinent labs & imaging results that were available during my care of the patient were reviewed by me and considered in my medical decision making (see chart for details).     Patient's presentation is most consistent with prostatitis given significant pain in the rectal area and boggy prostate However, UA is negative and patient at risk of fistula formation, small bowel obstruction CT abdomen and pelvis obtained and concerning for sigmoid abscess formation Patient does not have any abdominal pain however or white count Discussed with general surgery, they recommend IV abx, no surgical intervention  Alternatively, patient states he is feeling suicidal chronically He states that while he does not have a plan currently, he continues to feel very sad and feels "that he will not make it" He does not contract for safety He requests an emergency  psychiatric evaluation He states he is self treating with cocaine and has lost follow-up at [facility] Consider in place, psych labs obtained and reassuring Pending psych recommendations  3pm - discussed with medicine and surgery - they have discussed among themselves. They recommend transfer to [facility] since all care there, recent scan and unable to compare. Will transfer. Pt updated and in agreement with plan.   Final Cl recommendationsinical Impressions(s) / ED Diagnoses   Final diagnoses:  Sigmoiditis    New Prescriptions New Prescriptions   No medications on file       Karma Greaser, MD 11/19/16 Maskell Yao, MD 11/20/16 1556

## 2016-11-19 NOTE — Consult Note (Signed)
Reason for Consult:rectal spasm and discharge from the rectum Referring Physician: Dr. Allie Bossier (ED) Dr. Tiffany Kocher in [place] is an 51 y.o. male.  HPI: Pt presents with rectal spasms with new rectal discharge that is new.  Symptoms have been present for about 1 week.   He was to see his primary Surgeon in [facility], but missed his appointment.  He was last seen in [place] on 11/12/16 for this.  CT scan showed: 1.No significant change in size of lesion in the medial left lobe of the liver. 2.Decreased size of soft tissue mass associated with the distal ileum and the anastomotic sutures in the sigmoid. 3.Mild bilateral hydroureteronephrosis. The ureters appear to be obstructed at the level of the mass described above.  Work up in the ED here today shows: he is afebrile, VSS, normal WBC, CMP shows renal function is better than 10/31/16 and H/H is stable Hemoglobin 2/17 = 11.1, today it is 10.8.  CT scan today shows:   Prior partial colectomy with left lower quadrant ostomy. Thick walled gas and fluid collection noted just superior to the suture line in the Hartmann's pouch/remaining sigmoid colon concerning for dehiscence and adjacent abscess formation. Irregular low-density in the medial segment of the left hepatic lobe. Differential considerations would include focal fatty infiltration, thrombosed portal vein branch, or focal liver lesion. Recommend further evaluation with liver MRI.   Pt seen and evaluated by Dr. Georgette Dover.  It is his opinion pt should be sent back to his Primary providers in [facility], where they have all his records,  prior treatments, and   Management.      Past Medical History:  Diagnosis Date  . Colon cancer ([place])   . Chronic back pain last ED visit 08/27/16 2015  . 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 Depression  Last ED visit 10/31/16   . Suicidal ideation       Past Surgical History:  Procedure Laterality  Date  . PARTIAL COLECTOMY  06/2014   done at [facility]    Family History  Problem Relation Age of Onset  . Coronary artery disease Cousin 72    Social History:  reports that he has been smoking Cigarettes.  He has been smoking about 0.50 packs per day. He has never used smokeless tobacco. He reports that he drinks about 0.6 oz of alcohol per week . He reports that he uses drugs, including Marijuana and Cocaine. 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 Morphine And Related Itching    Prior to Admission medications   Medication Sig Start Date current use:   Taking? Authorizing Provider  ARIPiprazole (ABILIFY) 20 MG tablet Take 1 tablet (20 mg total) by mouth daily. Patient not taking: Reported on 08/27/2016 11/30/15 Not taking  Delfin Gant, NP  FLUoxetine (PROZAC) 20 MG capsule Take 1 capsule (20 mg total) by mouth daily. Patient not taking: Reported on 08/27/2016 11/30/15 Not taking  Delfin Gant, NP  hydrOXYzine (ATARAX/VISTARIL) 25 MG tablet Take 1 tablet (25 mg total) by mouth every 6 (six) hours as needed for anxiety. Patient not taking: Reported on 08/27/2016 11/30/15 Not taking  Delfin Gant, NP  ibuprofen (ADVIL,MOTRIN) 200 MG tablet Take 600 mg by mouth every 6 (six) hours as needed for headache (pain).  Not taking  Historical Provider, MD  lisinopril (PRINIVIL,ZESTRIL) 10 MG tablet Take 10 mg by mouth daily. 09/24/16 Taking  Historical Provider, MD  neomycin-bacitracin-polymyxin (NEOSPORIN) OINT Apply 1 application topically as needed for wound care. Patient not taking: Reported on 08/27/2016 11/26/15 Not taking  Encarnacion Slates, NP  oxyCODONE (OXY IR/ROXICODONE) 5 MG immediate release tablet Take 5 mg by mouth every 6 (six) hours as needed for severe pain.  Not taking  Historical Provider, MD  oxyCODONE (OXYCONTIN) 20 mg 12 hr tablet  Take 20 mg by mouth every 12 (twelve) hours.  Taking at home  Dr. Tiffany Kocher  Historical Provider, MD  sertraline (ZOLOFT) 100 MG tablet Take 100 mg by mouth daily.  08/11/16 08/11/17 Taking at home now  Historical Provider, MD  topiramate (TOPAMAX) 50 MG tablet Take 1 tablet (50 mg total) by mouth daily. Patient not taking: Reported on 08/27/2016 11/30/15 Not taking  Delfin Gant, NP  traZODone (DESYREL) 100 MG tablet Take 1 tablet (100 mg total) by mouth at bedtime. Patient not taking: Reported on 08/27/2016 11/30/15 Not taking  Delfin Gant, NP     Results for orders placed or performed during the hospital encounter of 11/19/16 (from the past 48 hour(s))  CBC with Differential     Status: Abnormal   Collection Time: 11/19/16 10:14 AM  Result Value Ref Range   WBC 5.5 4.0 - 10.5 K/uL   RBC 3.76 (L) 4.22 - 5.81 MIL/uL   Hemoglobin 10.8 (L) 13.0 - 17.0 g/dL   HCT 34.0 (L) 39.0 - 52.0 %   MCV 90.4 78.0 - 100.0 fL   MCH 28.7 26.0 - 34.0 pg   MCHC 31.8 30.0 - 36.0 g/dL   RDW 16.9 (H) 11.5 - 15.5 %   Platelets 350 150 - 400 K/uL   Neutrophils Relative % 62 %   Neutro Abs 3.4 1.7 - 7.7 K/uL   Lymphocytes Relative 30 %   Lymphs Abs 1.7 0.7 - 4.0 K/uL   Monocytes Relative 8 %   Monocytes Absolute 0.4 0.1 - 1.0 K/uL   Eosinophils Relative 0 %   Eosinophils Absolute 0.0 0.0 - 0.7 K/uL   Basophils Relative 0 %   Basophils Absolute 0.0 0.0 - 0.1 K/uL  Comprehensive metabolic panel     Status: Abnormal   Collection Time: 11/19/16 10:14 AM  Result Value Ref Range   Sodium 136 135 - 145 mmol/L   Potassium 3.6 3.5 - 5.1 mmol/L   Chloride 106 101 - 111 mmol/L   CO2 24 22 - 32 mmol/L   Glucose, Bld 108 (H) 65 - 99 mg/dL   BUN 13 6 - 20 mg/dL   Creatinine, Ser 0.74 0.61 - 1.24 mg/dL   Calcium 8.6 (L) 8.9 - 10.3 mg/dL   Total Protein 6.9 6.5 - 8.1 g/dL   Albumin 3.7 3.5 - 5.0 g/dL   AST 17 15 - 41 U/L   ALT 12 (L) 17 - 63 U/L   Alkaline Phosphatase 51 38 - 126 U/L   Total  Bilirubin 0.6 0.3 - 1.2 mg/dL   GFR calc non Af Amer >60 >60 mL/min   GFR calc Af Amer >60 >60 mL/min    Comment: (NOTE) The eGFR has been calculated using the CKD EPI equation. This calculation has not been validated in all clinical situations. eGFR's persistently <60 mL/min signify possible Chronic Kidney Disease.    Anion gap 6 5 - 15  Urinalysis, Routine w reflex microscopic  Status: Abnormal   Collection Time: 11/19/16 10:27 AM  Result Value Ref Range   Color, Urine YELLOW YELLOW   APPearance HAZY (A) CLEAR   Specific Gravity, Urine 1.016 1.005 - 1.030   pH 5.0 5.0 - 8.0   Glucose, UA NEGATIVE NEGATIVE mg/dL   Hgb urine dipstick SMALL (A) NEGATIVE   Bilirubin Urine NEGATIVE NEGATIVE   Ketones, ur NEGATIVE NEGATIVE mg/dL   Protein, ur NEGATIVE NEGATIVE mg/dL   Nitrite NEGATIVE NEGATIVE   Leukocytes, UA NEGATIVE NEGATIVE   RBC / HPF 0-5 0 - 5 RBC/hpf   WBC, UA 0-5 0 - 5 WBC/hpf   Bacteria, UA NONE SEEN NONE SEEN   Squamous Epithelial / LPF 0-5 (A) NONE SEEN   Mucous PRESENT    Hyaline Casts, UA PRESENT    Granular Casts, UA PRESENT   I-Stat CG4 Lactic Acid, ED     Status: None   Collection Time: 11/19/16  1:13 PM  Result Value Ref Range   Lactic Acid, Venous 0.90 0.5 - 1.9 mmol/L    Ct Abdomen Pelvis W Contrast  Result Date: 11/19/2016 CLINICAL DATA:  Rectal pain for 1 week. Diarrhea. Colostomy related to colon cancer. EXAM: CT ABDOMEN AND PELVIS WITH CONTRAST TECHNIQUE: Multidetector CT imaging of the abdomen and pelvis was performed using the standard protocol following bolus administration of intravenous contrast. CONTRAST:  100 cc Isovue-300 IV COMPARISON:  09/17/2015 FINDINGS: Lower chest: Lung bases are clear. No effusions. Heart is normal size. Hepatobiliary: Fatty infiltration of the liver. Irregular low-density noted in the medial segment of the left hepatic lobe, increased since prior study. This could reflect focal fatty infiltration although thrombosed portal  vein or focal liver lesion cannot be excluded. Gallbladder contracted, grossly unremarkable. Pancreas: No focal abnormality or ductal dilatation. Spleen: No focal abnormality.  Normal size. Adrenals/Urinary Tract: Bilateral renal cysts. No hydronephrosis. Adrenal glands and urinary bladder unremarkable. Stomach/Bowel: Postoperative changes from partial colectomy. Left lower quadrant ostomy noted and Hartmann's pouch in the pelvis. Gas and fluid collection with irregular thick walls noted just superior to the suture line at the [facility] pouch measuring 3.4 x 2.7 cm. This is new since prior study. This is worrisome for dehiscence and abscess. Stomach and small bowel decompressed, unremarkable. Vascular/Lymphatic: Aortic and iliac calcifications. No aneurysm or adenopathy. Reproductive: No visible focal abnormality. Other: No free fluid or free air.  No acute bony abnormality. Musculoskeletal: No acute bony abnormality. IMPRESSION: Prior partial colectomy with left lower quadrant ostomy. Thick walled gas and fluid collection noted just superior to the suture line in the Hartmann's pouch/remaining sigmoid colon concerning for dehiscence and adjacent abscess formation. Irregular low-density in the medial segment of the left hepatic lobe. Differential considerations would include focal fatty infiltration, thrombosed portal vein branch, or focal liver lesion. Recommend further evaluation with liver MRI. Electronically Signed   By: Rolm Baptise M.D.   On: 11/19/2016 12:36    Review of Systems  Constitutional: Negative.        Says he has not eaten in the last 3-4 days because of rectal pain.   Not sure about the weight.    HENT:       Right eye lid is a bit swollen,   Eyes: Negative for blurred vision, double vision, photophobia, pain, discharge and redness.  Respiratory: Negative.   Cardiovascular: Positive for chest pain (no chest pain for 8 months, not associated with cocaine). Negative for palpitations,  orthopnea, claudication, leg swelling and PND.  Gastrointestinal: Negative for  abdominal pain, blood in stool, constipation, diarrhea, heartburn, melena, nausea and vomiting.       His stool is a bit looser than before, but no real change.  Mucus like discharge from his rectum.  No blood from rectum or in stool.  Genitourinary: Negative.   Musculoskeletal: Positive for back pain (chronic).  Skin: Negative.   Neurological: Negative.   Endo/Heme/Allergies: Negative.   Psychiatric/Behavioral: Positive for depression, substance abuse and suicidal ideas. The patient is nervous/anxious.    Blood pressure 104/91, pulse 77, temperature 98.1 F (36.7 C), temperature source Oral, resp. rate 18, SpO2 96 %. Physical Exam  Constitutional: He is oriented to person, place, and time. He appears well-developed and well-nourished. No distress.  HENT:  Head: Normocephalic and atraumatic.  Eyes: Right eye exhibits discharge. Left eye exhibits no discharge. No scleral icterus.  Swelling of the right eye lid, with some discharge noted on eye lash.  Eye appears normal no complaints of vision changes.  Neck: Normal range of motion. Neck supple. No JVD present. No tracheal deviation present. No thyromegaly present.  Cardiovascular: Normal rate, regular rhythm, normal heart sounds and intact distal pulses.   No murmur heard. Respiratory: Effort normal and breath sounds normal. No respiratory distress. He has no wheezes. He has no rales. He exhibits no tenderness.  GI: Soft. Bowel sounds are normal. He exhibits no distension and no mass. There is no tenderness. There is no rebound and no guarding.  Well healed midline incision, ostomy working well.  Genitourinary:  Genitourinary Comments: Rectal exam shows some mucus like drainage, rectum with some tightness on exam.  No blood.   Musculoskeletal: He exhibits no edema or tenderness.  Lymphadenopathy:    He has no cervical adenopathy.  Neurological: He is alert and  oriented to person, place, and time. No cranial nerve deficit.  Skin: Skin is warm and dry. No rash noted. He is not diaphoretic. No erythema. No pallor.  Psychiatric:  Pt reports being very depressed.  Lives alone, reported suicidal ideations on admit thru triage, with sitter present. Also reports ongoing Cocaine, tobacco, and marijuana use.    Assessment/Plan: Colon cancer with metastasis liver and distal ileum and the anastomotic sutures in the sigmoid.  Last visit in [place] 11/12/16 Now with gas and fluid collection superior to the suture line of the Hartman's suture line Chronic back pain on OxyContin - [place] Ongoing substance use - cocaine, tobacco, marijuana Severe anxiety and  Depression   Last hospitalization 10/31/16 Suicidal ideation noted on admit to ED today - sitter with the patient   Plan:  Pt seen and evaluated by Dr. Georgette Dover.  He recommends pt be transferred back to [facility] and primary care team in [place].  Johnothan Bascomb 11/19/2016, 1:37 PM

## 2016-11-19 NOTE — ED Notes (Signed)
Patient wants to eat aware he can't eat at present. Sitter remains at beside. No problems noted.

## 2016-11-20 LAB — URINE CULTURE: CULTURE: NO GROWTH

## 2016-11-24 LAB — CULTURE, BLOOD (ROUTINE X 2)
CULTURE: NO GROWTH
Culture: NO GROWTH

## 2017-01-29 ENCOUNTER — Emergency Department (HOSPITAL_COMMUNITY)
Admission: EM | Admit: 2017-01-29 | Discharge: 2017-01-30 | Disposition: A | Discharge status: Home / Self Care | Payer: Medicaid Other | Attending: Emergency Medicine | Admitting: Emergency Medicine

## 2017-01-29 ENCOUNTER — Emergency Department (HOSPITAL_COMMUNITY): Payer: Medicaid Other

## 2017-01-29 ENCOUNTER — Encounter (HOSPITAL_COMMUNITY): Payer: Self-pay

## 2017-01-29 DIAGNOSIS — Y733 Surgical instruments, materials and gastroenterology and urology devices (including sutures) associated with adverse incidents: Secondary | ICD-10-CM | POA: Insufficient documentation

## 2017-01-29 DIAGNOSIS — Z79899 Other long term (current) drug therapy: Secondary | ICD-10-CM | POA: Diagnosis not present

## 2017-01-29 DIAGNOSIS — F1414 Cocaine abuse with cocaine-induced mood disorder: Secondary | ICD-10-CM | POA: Diagnosis present

## 2017-01-29 DIAGNOSIS — F17210 Nicotine dependence, cigarettes, uncomplicated: Secondary | ICD-10-CM | POA: Diagnosis not present

## 2017-01-29 DIAGNOSIS — Z85038 Personal history of other malignant neoplasm of large intestine: Secondary | ICD-10-CM | POA: Insufficient documentation

## 2017-01-29 DIAGNOSIS — E876 Hypokalemia: Secondary | ICD-10-CM | POA: Diagnosis not present

## 2017-01-29 DIAGNOSIS — F329 Major depressive disorder, single episode, unspecified: Secondary | ICD-10-CM | POA: Insufficient documentation

## 2017-01-29 DIAGNOSIS — L089 Local infection of the skin and subcutaneous tissue, unspecified: Secondary | ICD-10-CM

## 2017-01-29 DIAGNOSIS — F32A Depression, unspecified: Secondary | ICD-10-CM

## 2017-01-29 DIAGNOSIS — T814XXD Infection following a procedure, subsequent encounter: Secondary | ICD-10-CM | POA: Insufficient documentation

## 2017-01-29 DIAGNOSIS — F1290 Cannabis use, unspecified, uncomplicated: Secondary | ICD-10-CM | POA: Diagnosis not present

## 2017-01-29 DIAGNOSIS — R1030 Lower abdominal pain, unspecified: Secondary | ICD-10-CM | POA: Diagnosis present

## 2017-01-29 DIAGNOSIS — S31109D Unspecified open wound of abdominal wall, unspecified quadrant without penetration into peritoneal cavity, subsequent encounter: Secondary | ICD-10-CM

## 2017-01-29 LAB — URINALYSIS, ROUTINE W REFLEX MICROSCOPIC
BILIRUBIN URINE: NEGATIVE
Glucose, UA: NEGATIVE mg/dL
Hgb urine dipstick: NEGATIVE
KETONES UR: NEGATIVE mg/dL
Nitrite: NEGATIVE
Protein, ur: NEGATIVE mg/dL
SPECIFIC GRAVITY, URINE: 1.011 (ref 1.005–1.030)
SQUAMOUS EPITHELIAL / LPF: NONE SEEN
pH: 6 (ref 5.0–8.0)

## 2017-01-29 LAB — CBC
HEMATOCRIT: 35.4 % — AB (ref 39.0–52.0)
HEMOGLOBIN: 11.8 g/dL — AB (ref 13.0–17.0)
MCH: 27.6 pg (ref 26.0–34.0)
MCHC: 33.3 g/dL (ref 30.0–36.0)
MCV: 82.9 fL (ref 78.0–100.0)
Platelets: 501 10*3/uL — ABNORMAL HIGH (ref 150–400)
RBC: 4.27 MIL/uL (ref 4.22–5.81)
RDW: 17.3 % — ABNORMAL HIGH (ref 11.5–15.5)
WBC: 4.6 10*3/uL (ref 4.0–10.5)

## 2017-01-29 LAB — RAPID URINE DRUG SCREEN, HOSP PERFORMED
AMPHETAMINES: NOT DETECTED
BENZODIAZEPINES: NOT DETECTED
Barbiturates: NOT DETECTED
COCAINE: POSITIVE — AB
OPIATES: NOT DETECTED
Tetrahydrocannabinol: NOT DETECTED

## 2017-01-29 LAB — COMPREHENSIVE METABOLIC PANEL
ALBUMIN: 3.7 g/dL (ref 3.5–5.0)
ALK PHOS: 93 U/L (ref 38–126)
ALT: 18 U/L (ref 17–63)
ANION GAP: 12 (ref 5–15)
AST: 21 U/L (ref 15–41)
BUN: 12 mg/dL (ref 6–20)
CALCIUM: 9.1 mg/dL (ref 8.9–10.3)
CO2: 25 mmol/L (ref 22–32)
Chloride: 101 mmol/L (ref 101–111)
Creatinine, Ser: 1.39 mg/dL — ABNORMAL HIGH (ref 0.61–1.24)
GFR calc Af Amer: >60 mL/min (ref >60)
GFR calc non Af Amer: 59 mL/min — ABNORMAL LOW (ref >60)
GLUCOSE: 87 mg/dL (ref 65–99)
POTASSIUM: 2.9 mmol/L — AB (ref 3.5–5.1)
SODIUM: 138 mmol/L (ref 135–145)
Total Bilirubin: 0.6 mg/dL (ref 0.3–1.2)
Total Protein: 8.7 g/dL — ABNORMAL HIGH (ref 6.5–8.1)

## 2017-01-29 LAB — LIPASE, BLOOD: Lipase: 23 U/L (ref 11–51)

## 2017-01-29 LAB — POTASSIUM: POTASSIUM: 3 mmol/L — AB (ref 3.5–5.1)

## 2017-01-29 MED ORDER — IOPAMIDOL (ISOVUE-300) INJECTION 61%
100.0000 mL | Freq: Once | INTRAVENOUS | Status: AC | PRN
  Administered 2017-01-29: 100 mL via INTRAVENOUS

## 2017-01-29 MED ORDER — LISINOPRIL 10 MG PO TABS
10.0000 mg | ORAL_TABLET | Freq: Every day | ORAL | Status: DC
  Administered 2017-01-30: 10 mg via ORAL
  Filled 2017-01-29: qty 1

## 2017-01-29 MED ORDER — POTASSIUM CHLORIDE 20 MEQ/15ML (10%) PO SOLN
40.0000 meq | Freq: Once | ORAL | Status: AC
  Administered 2017-01-29: 40 meq via ORAL
  Filled 2017-01-29: qty 30

## 2017-01-29 MED ORDER — SODIUM CHLORIDE 0.9 % IV BOLUS (SEPSIS)
1000.0000 mL | Freq: Once | INTRAVENOUS | Status: AC
  Administered 2017-01-29: 1000 mL via INTRAVENOUS

## 2017-01-29 MED ORDER — IOPAMIDOL (ISOVUE-300) INJECTION 61%
INTRAVENOUS | Status: AC
  Filled 2017-01-29: qty 100

## 2017-01-29 MED ORDER — POTASSIUM CHLORIDE CRYS ER 20 MEQ PO TBCR
40.0000 meq | EXTENDED_RELEASE_TABLET | Freq: Once | ORAL | Status: AC
  Administered 2017-01-29: 40 meq via ORAL
  Filled 2017-01-29: qty 2

## 2017-01-29 MED ORDER — SERTRALINE HCL 50 MG PO TABS
100.0000 mg | ORAL_TABLET | Freq: Every day | ORAL | Status: DC
  Administered 2017-01-29 – 2017-01-30 (×2): 100 mg via ORAL
  Filled 2017-01-29 (×2): qty 2

## 2017-01-29 MED ORDER — GABAPENTIN 300 MG PO CAPS
300.0000 mg | ORAL_CAPSULE | Freq: Three times a day (TID) | ORAL | Status: DC
  Administered 2017-01-29 – 2017-01-30 (×3): 300 mg via ORAL
  Filled 2017-01-29 (×3): qty 1

## 2017-01-29 NOTE — ED Notes (Signed)
Pt tolerated water, a sandwich, and juice well.  A&Ox4.  Ambulatory w/steady gait.

## 2017-01-29 NOTE — ED Notes (Signed)
LAB WILL RUN OFF POTASSIUM FROM TUBE IN LAB

## 2017-01-29 NOTE — ED Notes (Addendum)
Pt stated "I just got out of jail 2 days ago.  This place on my stomach needs to be cleaned.  A home health nurse was coming out to clean it and change the dressing but that was in [place].  I got put in jail here.  They cleaned it 1 time in the jail."  Informed pt will speak to MD r/t wound care.  ABD applied to abd wound site.

## 2017-01-29 NOTE — ED Triage Notes (Signed)
Pt arrived via EMS found outside. Pt is c/o abdominal pain x 2 upper quads. Pt has hx of Colon Ca x 4 years. Pt has a colostomy bag noted to left upper quad, with a mid line opened  incision some dehiscing, and white drainage.No dressing in place.  Pt states that he had surgery 4-6 weeks ago. Pt  Reports pain worsens with ambulation.

## 2017-01-29 NOTE — ED Notes (Signed)
Bed: WA05 Expected date:  Expected time:  Means of arrival:  Comments: EMS-abdominal pain 

## 2017-01-29 NOTE — ED Notes (Signed)
Pt with lower midline incision draining purulent drainage.

## 2017-01-29 NOTE — ED Provider Notes (Signed)
[facility] DEPT Provider Note   CSN: [id number] Arrival date & time: 01/29/17  1107     History   Chief Complaint Chief Complaint  Patient presents with  . Abdominal Pain    HPI Wayland Baik is a 51 y.o. male.  HPI 51 y.o. Male h.o colon cancer anxiety depression s/p colostomy- states  [place].  States he was on is way to [employer] on foot to get wound care supplies.  States he was in his usual state of health, but didn't have breakfast.  Stopped at [employer] store because he began having pain lower abdomen and asked them to call 911.   Past Medical History:  Diagnosis Date  . Colon cancer ([place])   . Colon cancer ([place]) 2015  . 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 Depression   . Suicidal ideation     Patient Active Problem List   Diagnosis Date Noted  . Severe episode of recurrent major depressive disorder, with psychotic features ([place])   . MDD (major depressive disorder) 11/16/2015  . Severe episode of recurrent major depressive disorder, without psychotic features ([place])   . MDD (major depressive disorder), recurrent episode, severe ([place]) 10/17/2015  . Cocaine abuse 10/17/2015  . Alcohol abuse 10/17/2015  . Severe single current episode of major depressive disorder, with psychotic features ([place])   . Substance induced mood disorder ([place]) 10/08/2015  . 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 Colon cancer s/p chemo + radiation and resection. Now with colostomy bag 04/10/2015  . SVT (supraventricular tachycardia) ([place]) 04/10/2015  . Tobacco abuse 04/10/2015    Past Surgical History:  Procedure Laterality Date  . PARTIAL COLECTOMY  06/2014   done at [facility] Medications    Prior to Admission medications   Medication Sig Start Date End Date Taking? Authorizing Provider  ARIPiprazole (ABILIFY) 20 MG tablet Take 1 tablet (20 mg total) by mouth daily. Patient not taking: Reported on 08/27/2016 11/30/15   Delfin Gant, NP  FLUoxetine (PROZAC) 20 MG capsule Take 1 capsule (20 mg total) by mouth daily. Patient not taking: Reported on 08/27/2016 11/30/15   Delfin Gant, NP  hydrOXYzine (ATARAX/VISTARIL) 25 MG tablet Take 1 tablet (25 mg total) by mouth every 6 (six) hours as needed for anxiety. Patient not taking: Reported on 08/27/2016 11/30/15   Delfin Gant, NP  lisinopril (PRINIVIL,ZESTRIL) 10 MG tablet Take 10 mg by mouth daily. 09/24/16   [provider]  oxyCODONE (OXYCONTIN) 20 mg 12 hr tablet Take 20 mg by mouth every 12 (twelve) hours.    [provider]  sertraline (ZOLOFT) 100 MG tablet Take 100 mg by mouth daily.  08/11/16 08/11/17  [provider]  topiramate (TOPAMAX) 50 MG tablet Take 1 tablet (50 mg total) by mouth daily. Patient not taking: Reported on 08/27/2016 11/30/15   Delfin Gant, NP  traZODone (DESYREL) 100 MG tablet Take 1 tablet (100 mg total) by mouth at bedtime. Patient not taking: Reported on 08/27/2016 11/30/15   Delfin Gant, NP    Family History Family History  Problem Relation Age of Onset  . Coronary artery disease Cousin 36    Social History Social History  Substance Use Topics  . Smoking status: Current Every Day Smoker    Packs/day: 0.50    Types: Cigarettes  . Smokeless tobacco: Never Used  . Alcohol use 0.6 oz/week    1  Cans of beer per week     Comment: drinks 2 times a week, last drink (10/2015)     Allergies   Morphine and related   Review of Systems Review of Systems  Constitutional: Negative for appetite change, chills and fever.  HENT: Negative.   Eyes: Negative.   Respiratory: Negative.   Cardiovascular: Negative.   Gastrointestinal: Positive for abdominal pain. Negative for vomiting.  Endocrine: Negative.   Genitourinary: Negative.   Musculoskeletal: Negative.   Neurological: Negative.   Hematological: Negative.   Psychiatric/Behavioral: Negative.      Physical  Exam Updated Vital Signs BP 118/74   Pulse (!) 102   Temp 98.7 F (37.1 C) (Oral)   Resp 16   Ht 5\' 8"  (1.727 m)   Wt 220 lb (99.8 kg)   SpO2 100%   BMI 33.45 kg/m   Physical Exam  Constitutional: He is oriented to person, place, and time. He appears well-developed and well-nourished.  HENT:  Head: Normocephalic and atraumatic.  Right Ear: External ear normal.  Left Ear: External ear normal.  Nose: Nose normal.  Mouth/Throat: Oropharynx is clear and moist.  Eyes: Conjunctivae and EOM are normal. Pupils are equal, round, and reactive to light.  Neck: Normal range of motion. Neck supple.  Cardiovascular: Normal rate, regular rhythm, normal heart sounds and intact distal pulses.   Pulmonary/Chest: Effort normal and breath sounds normal. No respiratory distress. He has no wheezes. He exhibits no tenderness.  Abdominal: Soft. Bowel sounds are normal. He exhibits no distension and no mass. There is no tenderness. There is no guarding.  Patient with colostomy bag in place. Midline incision from umbilicus down with some granulation tissue healing well by secondary intention. There do appear to be some deeper areas that are intra-abdominal. No right upper quadrant or epigastric tenderness to palpation  Musculoskeletal: Normal range of motion.  Neurological: He is alert and oriented to person, place, and time. He has normal reflexes. He exhibits normal muscle tone. Coordination normal.  Skin: Skin is warm and dry.  Psychiatric: He has a normal mood and affect. His behavior is normal. Judgment and thought content normal.  Nursing note and vitals reviewed.    ED Treatments / Results  Labs (all labs ordered are listed, but only abnormal results are displayed) Labs Reviewed  COMPREHENSIVE METABOLIC PANEL - Abnormal; Notable for the following:       Result Value   Potassium 2.9 (*)    Creatinine, Ser 1.39 (*)    Total Protein 8.7 (*)    GFR calc non Af Amer 59 (*)    All other  components within normal limits  CBC - Abnormal; Notable for the following:    Hemoglobin 11.8 (*)    HCT 35.4 (*)    RDW 17.3 (*)    Platelets 501 (*)    All other components within normal limits  LIPASE, BLOOD  URINALYSIS, ROUTINE W REFLEX MICROSCOPIC  CBC WITH DIFFERENTIAL/PLATELET  COMPREHENSIVE METABOLIC PANEL  URINALYSIS, ROUTINE W REFLEX MICROSCOPIC    EKG  EKG Interpretation None       Radiology Ct Abdomen Pelvis W Contrast  Result Date: 01/29/2017 CLINICAL DATA:  Abdominal pain upper abdomen. Surgery 4- 6 weeks ago for small bowel obstruction. Worse with ambulation. History of colon cancer 4 years ago with partial colectomy as patient has colostomy bag. EXAM: CT ABDOMEN AND PELVIS WITH CONTRAST TECHNIQUE: Multidetector CT imaging of the abdomen and pelvis was performed using the standard protocol following bolus administration  of intravenous contrast. CONTRAST:  155mL ISOVUE-300 IOPAMIDOL (ISOVUE-300) INJECTION 61% COMPARISON:  11/19/2016 and 09/17/2015 FINDINGS: Lower chest: Lung bases are unremarkable. Hepatobiliary: Suggestion of mild gallbladder wall thickening without significant adjacent inflammatory change or free fluid. No definite cholelithiasis. Biliary tree is within normal. There is a somewhat irregular region of low-attenuation over the medial segment of the left lobe of the liver with a somewhat linear component extending towards the porta hepatis as this is not significantly changed. Pancreas: Within normal. Spleen: Within normal. Adrenals/Urinary Tract: Adrenal glands are normal. Kidneys are normal in size with several bilateral cysts unchanged. Mild prominence of the intrarenal collecting system bilaterally with mild prominence of the ureters down to the junction of the mid to distal ureters where there is an air and fluid collection just above Hartmann's pouch which may be causing a mild degree of obstruction. Distal ureters and bladder are normal. Stomach/Bowel:  Stomach is within normal. All is within normal. Surgical clips over the region of the cecum. Appendix not well visualized. Colostomy over the left mid abdomen as the ostomy site is unremarkable. Evidence of previous partial colectomy with Hartmann's pouch. There is an air and fluid collection immediately above the suture line of Hartmann's pouch in the midline measuring 4.1 x 5.8 cm (previously 3.4 x 2.7 cm). This may represent walled off collection two 2 breakdown/ dehiscence at the surgical site. There is fluid over the rectum. Vascular/Lymphatic: Mild calcified plaque over the abdominal aorta and iliac arteries. No adenopathy. Reproductive: Within normal. Other: No significant free fluid. Postsurgical changes over the midline anterior abdominal wall. Musculoskeletal: Minimal degenerate change of the spine and hips. IMPRESSION: Gallbladder wall thickening without adjacent inflammatory change or free fluid. No definite cholelithiasis. Consider ultrasound for further evaluation if clinically indicated. No evidence of bowel obstruction. Evidence of previous partial colectomy with left-sided colostomy site within normal. Slightly more prominent air and fluid collection just above Hartmann's pouch suture line in the midline measuring 4.1 x 5.8 cm (previously 3.4 x 2.7 cm). This may be due to chronic walled off collection from suture line dehiscence. Chronic stable region of low-attenuation over the medial segment of the left lobe of the liver. Bilateral renal cysts unchanged. Electronically Signed   By: Marin Olp M.D.   On: 01/29/2017 15:29   From last ct abdomen 4/17 at [facility]  GI:GI: Multiple small bowel loops are marginally distended and contain fluid and multiple air-fluid levels but show some improvement compared to previous exam. Left lower quadrant colostomy is patent. There is an air-fluid level in the midline of the central pelvis which I believe is extraluminal and possibly arising from the  [facility] pouch. Approximate measurements are 33 x 67 mm on image 112. Several tiny air collections are present in the right  lower quadrant on image 111 also not identified on prior study. Air collections may be postoperative however, there is a new anterior midline incision. Multiple small bowel loops are marginally distended and contain fluid and multiple air-fluid levels but show some improvement compared to previous exam. Left lower quadrant colostomy is patent. There is an air-fluid level in the midline of the central pelvis which I believe is extraluminal and possibly arising from the [facility] pouch. Approximate measurements are 33 x 67 mm on image 112. Several tiny air collections are present in the right  lower quadrant on image 111 also not identified on prior study. Air collections may be postoperative however, there is a new anterior midline incision. Procedures Procedures (including  critical care time)  Medications Ordered in ED Medications  sodium chloride 0.9 % bolus 1,000 mL (1,000 mLs Intravenous New Bag/Given 01/29/17 1236)     Initial Impression / Assessment and Plan / ED Course  I have reviewed the triage vital signs and the nursing notes.  Pertinent labs & imaging results that were available during my care of the patient were reviewed by me and considered in my medical decision making (see chart for details).      1-Open abdominal wound which has been being treated at [facility]. His not appear to be any sign of abdominal wound infection. Slightly more fluid in Hartmann's pouch-please see CT for details. Patient presents today without any dressing on his wound. We are placing wet-to-dry dressing. He has follow-up in place at [facility]. This is not appear to be change from previous. It should be treated with ongoing wet-to-dry dressings until surgical OBTAINED. 2 questionable cholecystitis on CT scan. There is gallbladder bladder wall thickening on the CT scan. However, the patient  does not have any tenderness to palpation in his right upper quadrant or epigastrium and his bilirubin and LFTs are normal as well as a normal white blood cell count. 3 depression patient states that his depression is worse. Denies suicidal ideation. He states he does not have anywhere to go at this time and feels that he needs evaluation for his depression. Will consult behavioral health. For hypokalemia patient has had potassium replaced. He will need recheck prior to discharge. Discussed with Dr. Ralene Bathe and she will recheck potassium.  Patient to be seen by TTS.  If cleared from [facility], may d/c to home to f/u with his md.  Vitals:   01/29/17 1118 01/29/17 1527  BP: 118/74 (!) 144/96  Pulse: (!) 102 77  Resp:  16  Temp:      Final Clinical Impressions(s) / ED Diagnoses   Final diagnoses:  Chronic wound infection of abdomen, subsequent encounter  Depression, unspecified depression type  Hypokalemia    New Prescriptions New Prescriptions   No medications on file     Pattricia Boss, MD 01/29/17 1646

## 2017-01-29 NOTE — ED Notes (Signed)
Spoke with lab to confirm urine available to complete UDS.  Per Richard [name], will call if not enough urine to run lab.

## 2017-01-29 NOTE — BH Assessment (Addendum)
Assessment Note  Richard [name] is an 51 y.o. male that presents this date with thoughts of self harm and intent but cannot verbalize a plan. Patient has a history of colon cancer (has colostomy) anxiety, depression, cocaine use and cannabis use. Patient states he has been residing at [facility] (sober living facility) for the last month until he was asked to leave two days ago due to SA use. Patient reports ongoing cocaine use reporting "binges" that occur two to three times a month with last use on 01/28/17 when patient reported using a unknown amount of powdered cocaine. Patient also admits to frequent Cannabis use but is vague in reference to last use and amount. Patient has a history of ETOH use but states he is currently having problems associated with his colon and denies any current alcohol use. Patient is oriented to time/place and denies any H/I or AVH. Patient states he has thoughts of self harm this date with intent but cannot verbalize a plan. Patient stated he "tried and tried" to maintain his sobriety but relapsed last week due to being depressed over his health. Patient reports ongoing depression with symptoms to include: feelings of worthlessness and hopelessness. Patient stated he does not have a current psychiatric provider but reports being prescribed Zoloft that he received the last time he was admitted to [facility] on 11/19/16 for S/I and depression. Patient stated he takes that medication "off and on." Patient denies current compliance. Patient per note review has had multiple admission to [facility] and has received OP services in the past from [facility] services in [place]. Patient cannot recall his last visit there. Patient reports two prior attempts at self harm and feels this date he is "at the end of his road" after being asked to leave his residential community. Patient states that has been staying at a local motel and after having abdominal pain this date while at the [facility] store, contacted 911.  Patient speaks in a low voice and became tearful during the assessment. Patient states that he has been inpatient several times but wants to "get it right this time" and get the support he needs. He states that he currently has colon cancer and a colostomy bag. Patient states he does not have a lot of family support and continues to feel this depressed." Pt states that he has been using cocaine and marijuana to cope with the pain. Case was staffed with Reita Cliche DNP who recommended patient be re-evaluated in the a.m.   Diagnosis: Major Depressive Disorder, recurrent severe, cocaine use disorder, severe, cannabis use disorder severe    Diagnosis: MDD Past Medical History:  Past Medical History:  Diagnosis Date  . Colon cancer ([place])   . Colon cancer ([place]) 2015  . 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 Depression   . Suicidal ideation     Past Surgical History:  Procedure Laterality Date  . PARTIAL COLECTOMY  06/2014   done at [facility]    Family History:  Family History  Problem Relation Age of Onset  . Coronary artery disease Cousin 35    Social History:  reports that he has been smoking Cigarettes.  He has been smoking about 0.50 packs per day. He has never used smokeless tobacco. He reports that he drinks about 0.6 oz of alcohol per week . He reports that he uses drugs, including Marijuana and Cocaine.  Additional Social History:  Alcohol / Drug Use Pain Medications: denies Prescriptions: denies Over  the Counter: denies History of alcohol / drug use?: Yes Longest period of sobriety (when/how long): 1 year 2015 Negative Consequences of Use: Personal relationships Withdrawal Symptoms: Agitation Substance #1 Name of Substance 1: Cannabis 1 - Age of First Use: 23 1 - Amount (size/oz):  1 to 2 grams  1 - Frequency: 2 to 3 times a week 1 - Duration: Unknown amount 1 - Last Use / Amount: Denies current use Substance #2 Name of Substance 2: Cocaine 2 -  Age of First Use: 51 yrs old  2 - Amount (size/oz): "cocaine binges" 2 - Frequency: Pt reports binges using 1 to 2 times a month 2 - Duration: On going 2 - Last Use / Amount: 01/28/17 Unknown amount  CIWA: CIWA-Ar BP: (!) 144/96 Pulse Rate: 77 COWS:    Allergies:  Allergies  Allergen Reactions  . Morphine And Related Itching    Home Medications:  (Not in a hospital admission)  OB/GYN Status:  No LMP for male patient.  General Assessment Data Location of Assessment: [facility] ED TTS Assessment: In system Is this a Tele or Face-to-Face Assessment?: Face-to-Face Is this an Initial Assessment or a Re-assessment for this encounter?: Initial Assessment Marital status: Single Maiden name: NA Is patient pregnant?: No Pregnancy Status: No Living Arrangements: Alone (pt is residing at [facility]) Can pt return to current living arrangement?: Yes Admission Status: Voluntary Is patient capable of signing voluntary admission?: Yes Referral Source: Self/Family/Friend Insurance type: Medicaid  Medical Screening Exam ([place]) Medical Exam completed: Yes  Crisis Care Plan Living Arrangements: Alone (pt is residing at [facility]) Legal Guardian:  (NA) Name of Psychiatrist: None Name of Therapist: None  Education Status Is patient currently in school?: No Current Grade: NA (NA) Highest grade of school patient has completed: GED Name of school: NA Contact person: NA  Risk to self with the past 6 months Suicidal Ideation: Yes-Currently Present Has patient been a risk to self within the past 6 months prior to admission? : No Suicidal Intent: Yes-Currently Present Has patient had any suicidal intent within the past 6 months prior to admission? : No Is patient at risk for suicide?: Yes Suicidal Plan?: No Has patient had any suicidal plan within the past 6 months prior to admission? : No Access to Means: No What has been your use of drugs/alcohol within the last 12  months?: Current use Previous Attempts/Gestures: Yes How many times?: 2 Other Self Harm Risks: NA Triggers for Past Attempts: Unknown Intentional Self Injurious Behavior: None Family Suicide History: No Recent stressful life event(s): Other (Comment) (Loss of housing) Persecutory voices/beliefs?: No Depression: Yes Depression Symptoms: Feeling worthless/self pity Substance abuse history and/or treatment for substance abuse?: Yes Suicide prevention information given to non-admitted patients: Not applicable  Risk to Others within the past 6 months Homicidal Ideation: No Does patient have any lifetime risk of violence toward others beyond the six months prior to admission? : No Thoughts of Harm to Others: No Current Homicidal Intent: No Current Homicidal Plan: No Access to Homicidal Means: No Identified Victim: NA History of harm to others?: No Assessment of Violence: None Noted Violent Behavior Description: NA Does patient have access to weapons?: No Criminal Charges Pending?: No Does patient have a court date: No Is patient on probation?: No  Psychosis Hallucinations: None noted Delusions: None noted  Mental Status Report Appearance/Hygiene: In hospital gown Eye Contact: Fair Motor Activity: Freedom of movement Speech: Logical/coherent Level of Consciousness: Quiet/awake Mood: Depressed Affect: Appropriate to  circumstance Anxiety Level: Minimal Thought Processes: Coherent, Relevant Judgement: Unimpaired Orientation: Person, Place, Time Obsessive Compulsive Thoughts/Behaviors: None  Cognitive Functioning Concentration: Normal Memory: Recent Intact, Remote Intact IQ: Average Insight: Fair Impulse Control: Poor Appetite: Fair Weight Loss: 0 Weight Gain: 0 Sleep: Decreased Total Hours of Sleep: 5 Vegetative Symptoms: None  ADLScreening [facility] Assessment Services) Patient's cognitive ability adequate to safely complete daily activities?: Yes Patient able to  express need for assistance with ADLs?: Yes Independently performs ADLs?: Yes (appropriate for developmental age)  Prior Inpatient Therapy Prior Inpatient Therapy: Yes Prior Therapy Dates: 2018 Prior Therapy Facilty/Provider(s): [facility] Reason for Treatment: MH issues, SA issues  Prior Outpatient Therapy Prior Outpatient Therapy: Yes Prior Therapy Dates: 2018 Prior Therapy Facilty/Provider(s): [facility] services Reason for Treatment: MH issues Does patient have an ACCT team?: No Does patient have Intensive In-House Services?  : No Does patient have [facility] services? : No Does patient have P4CC services?: No  ADL Screening (condition at time of admission) Patient's cognitive ability adequate to safely complete daily activities?: Yes Is the patient deaf or have difficulty hearing?: No Does the patient have difficulty seeing, even when wearing glasses/contacts?: No Does the patient have difficulty concentrating, remembering, or making decisions?: No Patient able to express need for assistance with ADLs?: Yes Independently performs ADLs?: Yes (appropriate for developmental age) Does the patient have difficulty walking or climbing stairs?: No Weakness of Legs: None Weakness of Arms/Hands: None  Home Assistive Devices/Equipment Home Assistive Devices/Equipment: None  Therapy Consults (therapy consults require a physician order) PT Evaluation Needed: No OT Evalulation Needed: No SLP Evaluation Needed: No Abuse/Neglect Assessment (Assessment to be complete while patient is alone) Physical Abuse: Denies Verbal Abuse: Denies Sexual Abuse: Denies Exploitation of patient/patient's resources: Denies Self-Neglect: Denies Values / Beliefs Cultural Requests During Hospitalization: None Spiritual Requests During Hospitalization: None Consults Spiritual Care Consult Needed: No Social Work Consult Needed: No Regulatory affairs officer (For Healthcare) Does Patient Have a Medical Advance Directive?:  No Would patient like information on creating a medical advance directive?: No - Patient declined    Additional Information 1:1 In Past 12 Months?: No CIRT Risk: No Elopement Risk: No Does patient have medical clearance?: Yes     Disposition: Case was staffed with Reita Cliche DNP who recommended patient be re-evaluated in the a.m.   Disposition Initial Assessment Completed for this Encounter: Yes Disposition of Patient: Other dispositions Other disposition(s): Other (Comment) (Pt will be re-evaluated in the a.m.)  On Site Evaluation by:   Reviewed with Physician:    Mamie Nick 01/29/2017 5:01 PM

## 2017-01-29 NOTE — BH Assessment (Signed)
[facility] Assessment Progress Note   Case was staffed with Lord DNP who recommended patient be re-evaluated in the a.m.    

## 2017-01-29 NOTE — ED Notes (Signed)
Pt stated "I did cocaine about a day ago.  The lady at the [facility] told me I might be able to get in to the one in [place].  My family is in [place] but I can't go back there until I get all of this taken care."

## 2017-01-29 NOTE — ED Notes (Signed)
PT has  Midline abdominal incision superior umbilical 3.4 cm x 1.0 x  0.0, Inferior  umbilical 5.5 x 1.5 x 1.0

## 2017-01-29 NOTE — ED Notes (Signed)
Pt aware a urine sample is needed but is unable to provide one at this time, urinal at bedside.

## 2017-01-29 NOTE — ED Notes (Signed)
Pt states "I've been feeling depressed but I'm not suicidal", asked for psych assessment.  MD aware.

## 2017-01-30 DIAGNOSIS — F1414 Cocaine abuse with cocaine-induced mood disorder: Secondary | ICD-10-CM | POA: Diagnosis present

## 2017-01-30 DIAGNOSIS — F1290 Cannabis use, unspecified, uncomplicated: Secondary | ICD-10-CM

## 2017-01-30 DIAGNOSIS — F17210 Nicotine dependence, cigarettes, uncomplicated: Secondary | ICD-10-CM

## 2017-01-30 LAB — POTASSIUM: POTASSIUM: 3.4 mmol/L — AB (ref 3.5–5.1)

## 2017-01-30 MED ORDER — OXYCODONE HCL ER 10 MG PO T12A
20.0000 mg | EXTENDED_RELEASE_TABLET | Freq: Two times a day (BID) | ORAL | Status: DC
  Administered 2017-01-30 (×2): 20 mg via ORAL
  Filled 2017-01-30 (×2): qty 2

## 2017-01-30 MED ORDER — WHITE PETROLATUM GEL
Status: DC | PRN
  Filled 2017-01-30: qty 5

## 2017-01-30 NOTE — [facility] Suicide Risk Assessment (Signed)
Suicide Risk Assessment  Discharge Assessment   [facility] Discharge Suicide Risk Assessment   Principal Problem: Cocaine abuse with cocaine-induced mood disorder [facility]) Discharge Diagnoses:  Patient Active Problem List   Diagnosis Date Noted  . Cocaine abuse with cocaine-induced mood disorder ([place]) [F14.14] 01/30/2017    Priority: High  . Cocaine abuse [F14.10] 10/17/2015  . Alcohol abuse [F10.10] 10/17/2015  . 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 Colon cancer s/p chemo + radiation and resection. Now with colostomy bag [C18.9] 04/10/2015  . SVT (supraventricular tachycardia) ([place]) [I47.1] 04/10/2015  . Tobacco abuse [Z72.0] 04/10/2015    Total Time spent with patient: 45 minutes  Musculoskeletal: Strength & Muscle Tone: within normal limits Gait & Station: normal Patient leans: N/A  Psychiatric Specialty Exam: Physical Exam  Constitutional: He is oriented to person, place, and time. He appears well-developed and well-nourished.  HENT:  Head: Normocephalic.  Neck: Normal range of motion.  Respiratory: Effort normal.  Musculoskeletal: Normal range of motion.  Neurological: He is alert and oriented to person, place, and time.  Psychiatric: His speech is normal and behavior is normal. Judgment and thought content normal. Cognition and memory are normal. He exhibits a depressed mood.    Review of Systems  Psychiatric/Behavioral: Positive for depression and substance abuse.  All other systems reviewed and are negative.   Blood pressure 120/80, pulse 75, temperature 98.3 F (36.8 C), temperature source Oral, resp. rate 20, height 5\' 8"  (1.727 m), weight 99.8 kg (220 lb), SpO2 96 %.Body mass index is 33.45 kg/m.  General Appearance: Casual  Eye Contact:  Good  Speech:  Normal Rate  Volume:  Normal  Mood:  Depressed, mild  Affect:  Congruent  Thought Process:  Coherent and Descriptions of Associations: Intact  Orientation:  Full (Time, Place, and Person)  Thought Content:   WDL and Logical  Suicidal Thoughts:  No  Homicidal Thoughts:  No  Memory:  Immediate;   Good Recent;   Good Remote;   Good  Judgement:  Fair  Insight:  Fair  Psychomotor Activity:  Normal  Concentration:  Concentration: Good and Attention Span: Good  Recall:  Good  Fund of Knowledge:  Fair  Language:  Good  Akathisia:  No  Handed:  Right  AIMS (if indicated):     Assets:  Leisure Time Resilience  ADL's:  Intact  Cognition:  WNL  Sleep:       Mental Status Per Nursing Assessment::   On Admission:   cocaine abuse with suicidal ideations  Demographic Factors:  Male  Loss Factors: Decline in physical health  Historical Factors: NA  Risk Reduction Factors:   Sense of responsibility to family, Positive social support and Positive therapeutic relationship  Continued Clinical Symptoms:  Depression, mild  Cognitive Features That Contribute To Risk:  None    Suicide Risk:  Minimal: No identifiable suicidal ideation.  Patients presenting with no risk factors but with morbid ruminations; may be classified as minimal risk based on the severity of the depressive symptoms    Plan Of Care/Follow-up recommendations:  Activity:  as tolerated Diet:  heart healthy diet  Troy Hartzog, NP 01/30/2017, 11:18 AM

## 2017-01-30 NOTE — Consult Note (Signed)
[facility] Face-to-Face Psychiatry Consult   Reason for Consult:  Cocaine abuse with suicidal ideations Referring Physician:  EDP Patient Identification: Richard [name] MRN:  [id number] Principal Diagnosis: Cocaine abuse with cocaine-induced mood disorder [facility]) Diagnosis:   Patient Active Problem List   Diagnosis Date Noted  . Cocaine abuse with cocaine-induced mood disorder ([place]) [F14.14] 01/30/2017    Priority: High  . Cocaine abuse [F14.10] 10/17/2015  . Alcohol abuse [F10.10] 10/17/2015  . Colostomy complication ([place]) [I77.82]   . Colon cancer s/p chemo + radiation and resection. Now with colostomy bag [C18.9] 04/10/2015  . SVT (supraventricular tachycardia) ([place]) [I47.1] 04/10/2015  . Tobacco abuse [Z72.0] 04/10/2015    Total Time spent with patient: 45 minutes  Subjective:   Richard [name] is a 51 y.o. male patient does not warrant admission.  HPI:  51 yo male who presented to the ED after abusing cocaine and having suicidal ideations.  Today, he has no suicidal/homicidal ideations, hallucinations, or withdrawal symptoms.  He reports he came to [facility] from [facility] for past warrants and serve time in jail.  When he was there, he states the [employer] voted him out because they thought he had new charges.  He reported earlier he was discharged from the [facility] for cocaine abuse.  Today, he states he had been clean for 4-5 months until 5/17 but labs from his visits in February and March are positive for cocaine only.  When he got out of jail, he went to use cocaine and then came here requesting a list of [facility] in [place] to seek shelter.  He also reports his [employer] will not allow him to return because he did not reveal all of his medications, specifically oxycontin.  Negative on UDS for opiates but positive for cocaine.  The social worker will meet with him to provide resources prior to discharge and hopefully a bus pass to [facility].  Past Psychiatric  History: depression, substance abuse  Risk to Self: None Risk to Others: Homicidal Ideation: No Thoughts of Harm to Others: No Current Homicidal Intent: No Current Homicidal Plan: No Access to Homicidal Means: No Identified Victim: NA History of harm to others?: No Assessment of Violence: None Noted Violent Behavior Description: NA Does patient have access to weapons?: No Criminal Charges Pending?: No Does patient have a court date: No Prior Inpatient Therapy: Prior Inpatient Therapy: Yes Prior Therapy Dates: 2018 Prior Therapy Facilty/Provider(s): [facility] Reason for Treatment: MH issues, SA issues Prior Outpatient Therapy: Prior Outpatient Therapy: Yes Prior Therapy Dates: 2018 Prior Therapy Facilty/Provider(s): [facility] services Reason for Treatment: MH issues Does patient have an ACCT team?: No Does patient have Intensive In-House Services?  : No Does patient have [facility] services? : No Does patient have P4CC services?: No  Past Medical History:  Past Medical History:  Diagnosis Date  . Colon cancer ([place])   . Colon cancer ([place]) 2015  . Colostomy complication ([place]) [id number]   blood in colostomy bag  . Depression   . Suicidal ideation     Past Surgical History:  Procedure Laterality Date  . PARTIAL COLECTOMY  06/2014   done at [facility]   Family History:  Family History  Problem Relation Age of Onset  . Coronary artery disease Cousin 40   Family Psychiatric  History: unknown Social History:  History  Alcohol Use  . 0.6 oz/week  . 1 Cans of beer per week    Comment: drinks 2 times a week, last drink (10/2015)  History  Drug Use  . Types: Marijuana, Cocaine    Comment: Lst used 3 days ago    Social History   Social History  . Marital status: Single    Spouse name: N/A  . Number of children: N/A  . Years of education: N/A   Social History Main Topics  . Smoking status: Current Every Day Smoker    Packs/day: 0.50    Types: Cigarettes  . Smokeless tobacco:  Never Used  . Alcohol use 0.6 oz/week    1 Cans of beer per week     Comment: drinks 2 times a week, last drink (10/2015)  . Drug use: Yes    Types: Marijuana, Cocaine     Comment: Lst used 3 days ago  . Sexual activity: No   Other Topics Concern  . None   Social History Narrative  . None   Additional Social History:    Allergies:   Allergies  Allergen Reactions  . Morphine And Related Itching    Labs:  Results for orders placed or performed during the hospital encounter of 01/29/17 (from the past 48 hour(s))  Lipase, blood     Status: None   Collection Time: 01/29/17 11:31 AM  Result Value Ref Range   Lipase 23 11 - 51 U/L  Comprehensive metabolic panel     Status: Abnormal   Collection Time: 01/29/17 11:31 AM  Result Value Ref Range   Sodium 138 135 - 145 mmol/L   Potassium 2.9 (L) 3.5 - 5.1 mmol/L   Chloride 101 101 - 111 mmol/L   CO2 25 22 - 32 mmol/L   Glucose, Bld 87 65 - 99 mg/dL   BUN 12 6 - 20 mg/dL   Creatinine, Ser 1.39 (H) 0.61 - 1.24 mg/dL   Calcium 9.1 8.9 - 10.3 mg/dL   Total Protein 8.7 (H) 6.5 - 8.1 g/dL   Albumin 3.7 3.5 - 5.0 g/dL   AST 21 15 - 41 U/L   ALT 18 17 - 63 U/L   Alkaline Phosphatase 93 38 - 126 U/L   Total Bilirubin 0.6 0.3 - 1.2 mg/dL   GFR calc non Af Amer 59 (L) >60 mL/min   GFR calc Af Amer >60 >60 mL/min    Comment: (NOTE) The eGFR has been calculated using the CKD EPI equation. This calculation has not been validated in all clinical situations. eGFR's persistently <60 mL/min signify possible Chronic Kidney Disease.    Anion gap 12 5 - 15  CBC     Status: Abnormal   Collection Time: 01/29/17 11:31 AM  Result Value Ref Range   WBC 4.6 4.0 - 10.5 K/uL   RBC 4.27 4.22 - 5.81 MIL/uL   Hemoglobin 11.8 (L) 13.0 - 17.0 g/dL   HCT 35.4 (L) 39.0 - 52.0 %   MCV 82.9 78.0 - 100.0 fL   MCH 27.6 26.0 - 34.0 pg   MCHC 33.3 30.0 - 36.0 g/dL   RDW 17.3 (H) 11.5 - 15.5 %   Platelets 501 (H) 150 - 400 K/uL  Potassium     Status:  Abnormal   Collection Time: 01/29/17 11:31 AM  Result Value Ref Range   Potassium 3.0 (L) 3.5 - 5.1 mmol/L  Urinalysis, Routine w reflex microscopic     Status: Abnormal   Collection Time: 01/29/17  3:15 PM  Result Value Ref Range   Color, Urine YELLOW YELLOW   APPearance CLEAR CLEAR   Specific Gravity, Urine 1.011 1.005 - 1.030  pH 6.0 5.0 - 8.0   Glucose, UA NEGATIVE NEGATIVE mg/dL   Hgb urine dipstick NEGATIVE NEGATIVE   Bilirubin Urine NEGATIVE NEGATIVE   Ketones, ur NEGATIVE NEGATIVE mg/dL   Protein, ur NEGATIVE NEGATIVE mg/dL   Nitrite NEGATIVE NEGATIVE   Leukocytes, UA TRACE (A) NEGATIVE   RBC / HPF 0-5 0 - 5 RBC/hpf   WBC, UA 0-5 0 - 5 WBC/hpf   Bacteria, UA RARE (A) NONE SEEN   Squamous Epithelial / LPF NONE SEEN NONE SEEN   Mucous PRESENT    Hyaline Casts, UA PRESENT   Rapid urine drug screen (hospital performed)     Status: Abnormal   Collection Time: 01/29/17  3:15 PM  Result Value Ref Range   Opiates NONE DETECTED NONE DETECTED   Cocaine POSITIVE (A) NONE DETECTED   Benzodiazepines NONE DETECTED NONE DETECTED   Amphetamines NONE DETECTED NONE DETECTED   Tetrahydrocannabinol NONE DETECTED NONE DETECTED   Barbiturates NONE DETECTED NONE DETECTED    Comment:        DRUG SCREEN FOR MEDICAL PURPOSES ONLY.  IF CONFIRMATION IS NEEDED FOR ANY PURPOSE, NOTIFY LAB WITHIN 5 DAYS.        LOWEST DETECTABLE LIMITS FOR URINE DRUG SCREEN Drug Class       Cutoff (ng/mL) Amphetamine      1000 Barbiturate      200 Benzodiazepine   527 Tricyclics       782 Opiates          300 Cocaine          300 THC              50   Potassium     Status: Abnormal   Collection Time: 01/30/17  4:59 AM  Result Value Ref Range   Potassium 3.4 (L) 3.5 - 5.1 mmol/L    Current Facility-Administered Medications  Medication Dose Route Frequency Provider Last Rate Last Dose  . gabapentin (NEURONTIN) capsule 300 mg  300 mg Oral TID Quintella Reichert, MD   300 mg at 01/30/17 4235  .  lisinopril (PRINIVIL,ZESTRIL) tablet 10 mg  10 mg Oral Daily Quintella Reichert, MD   10 mg at 01/30/17 3614  . oxyCODONE (OXYCONTIN) 12 hr tablet 20 mg  20 mg Oral Q12H Quintella Reichert, MD   20 mg at 01/30/17 4315  . sertraline (ZOLOFT) tablet 100 mg  100 mg Oral Daily Quintella Reichert, MD   100 mg at 01/30/17 4008  . white petrolatum (VASELINE) gel   Topical PRN Lacretia Leigh, MD       Current Outpatient Prescriptions  Medication Sig Dispense Refill  . diclofenac sodium (VOLTAREN) 1 % GEL Apply 2 g topically 2 (two) times daily.    Marland Kitchen gabapentin (NEURONTIN) 300 MG capsule Take 300 mg by mouth 3 (three) times daily.    Marland Kitchen lisinopril (PRINIVIL,ZESTRIL) 10 MG tablet Take 10 mg by mouth daily.    Marland Kitchen oxyCODONE (OXYCONTIN) 20 mg 12 hr tablet Take 20 mg by mouth every 12 (twelve) hours.    . sertraline (ZOLOFT) 100 MG tablet Take 100 mg by mouth daily.     . ARIPiprazole (ABILIFY) 20 MG tablet Take 1 tablet (20 mg total) by mouth daily. (Patient not taking: Reported on 08/27/2016) 30 tablet 0  . FLUoxetine (PROZAC) 20 MG capsule Take 1 capsule (20 mg total) by mouth daily. (Patient not taking: Reported on 08/27/2016) 30 capsule 0  . hydrOXYzine (ATARAX/VISTARIL) 25 MG tablet Take 1 tablet (25 mg total)  by mouth every 6 (six) hours as needed for anxiety. (Patient not taking: Reported on 08/27/2016) 30 tablet 0  . topiramate (TOPAMAX) 50 MG tablet Take 1 tablet (50 mg total) by mouth daily. (Patient not taking: Reported on 08/27/2016) 30 tablet 0  . traZODone (DESYREL) 100 MG tablet Take 1 tablet (100 mg total) by mouth at bedtime. (Patient not taking: Reported on 08/27/2016) 30 tablet 0    Musculoskeletal: Strength & Muscle Tone: within normal limits Gait & Station: normal Patient leans: N/A  Psychiatric Specialty Exam: Physical Exam  Constitutional: He is oriented to person, place, and time. He appears well-developed and well-nourished.  HENT:  Head: Normocephalic.  Neck: Normal range of motion.   Respiratory: Effort normal.  Musculoskeletal: Normal range of motion.  Neurological: He is alert and oriented to person, place, and time.  Psychiatric: His speech is normal and behavior is normal. Judgment and thought content normal. Cognition and memory are normal. He exhibits a depressed mood.    Review of Systems  Psychiatric/Behavioral: Positive for depression and substance abuse.  All other systems reviewed and are negative.   Blood pressure 120/80, pulse 75, temperature 98.3 F (36.8 C), temperature source Oral, resp. rate 20, height '5\' 8"'$  (1.727 m), weight 99.8 kg (220 lb), SpO2 96 %.Body mass index is 33.45 kg/m.  General Appearance: Casual  Eye Contact:  Good  Speech:  Normal Rate  Volume:  Normal  Mood:  Depressed, mild  Affect:  Congruent  Thought Process:  Coherent and Descriptions of Associations: Intact  Orientation:  Full (Time, Place, and Person)  Thought Content:  WDL and Logical  Suicidal Thoughts:  No  Homicidal Thoughts:  No  Memory:  Immediate;   Good Recent;   Good Remote;   Good  Judgement:  Fair  Insight:  Fair  Psychomotor Activity:  Normal  Concentration:  Concentration: Good and Attention Span: Good  Recall:  Good  Fund of Knowledge:  Fair  Language:  Good  Akathisia:  No  Handed:  Right  AIMS (if indicated):     Assets:  Leisure Time Resilience  ADL's:  Intact  Cognition:  WNL  Sleep:        Treatment Plan Summary: Daily contact with patient to assess and evaluate symptoms and progress in treatment, Medication management and Plan cocaine abuse with cocaine induced mood disorder:  -Crisis stabilization -Medication management:  Continue medical medications except Oxycontin along with gabapentin 300 mg TID for pain and withdrawal, Zoloft 100 mg daily for depression -Individual and substance abuse counseling -Outpatient resources along with [employer] resources in [place]  Disposition: No evidence of imminent risk to self or others  at present.    Waylan Boga, NP 01/30/2017 10:59 AM  Patient seen face-to-face for psychiatric evaluation, chart reviewed and case discussed with the physician extender and developed treatment plan. Reviewed the information documented and agree with the treatment plan. Corena Pilgrim, MD

## 2017-01-30 NOTE — Progress Notes (Signed)
Per psychiatrist request, CSW spoke with patient at bedside regarding his request for listing of [facility] in [place]. CSW provided patient with listing of [facility] in [place] with vacancies. Patient reported that he plans to go to an [facility] in [place]. CSW inquired if patient needed any additional resources, patient reported that he was unsure.   CSW followed up with patient to see if he needed any additional resources, patient reported that he needed someone to talk to about his problems. CSW validated patient's concerns and provided patient with local mental health outpatient listing. Patient shook his head, CSW encouraged patient to follow up with a local mental health provider to arrange therapy so he could start talking about his problems. Patient reported that he needed a way to get to [facility], [place] provided patient with bus pass and part bus schedule. Patient reported that he didn't have any money and walked off.   Abundio Miu, [facility]  Clinical Social Worker [phone]

## 2017-01-30 NOTE — ED Notes (Signed)
Informed Dr. Ralene Bathe of pt's lumbar pain.  Dr. Ralene Bathe to order pain meds.

## 2017-01-30 NOTE — ED Provider Notes (Signed)
Patient with persistent hypokalemia on recheck but is improved compared to priors at 3.0. Providing additional dose of potassium and will recheck in the morning. He is eating and drinking without difficulty.   Quintella Reichert, MD 01/30/17 [phone]

## 2017-02-02 IMAGING — DX DG CHEST 2V
2 series · 2 of 2 positions shown · non-contrast
Comparison: Chest radiograph and CTA of the chest performed
07/31/2015

CLINICAL DATA: Acute onset of generalized chest pain and shortness
of breath. Initial encounter.

EXAM:
CHEST  2 VIEW

[chest pa]
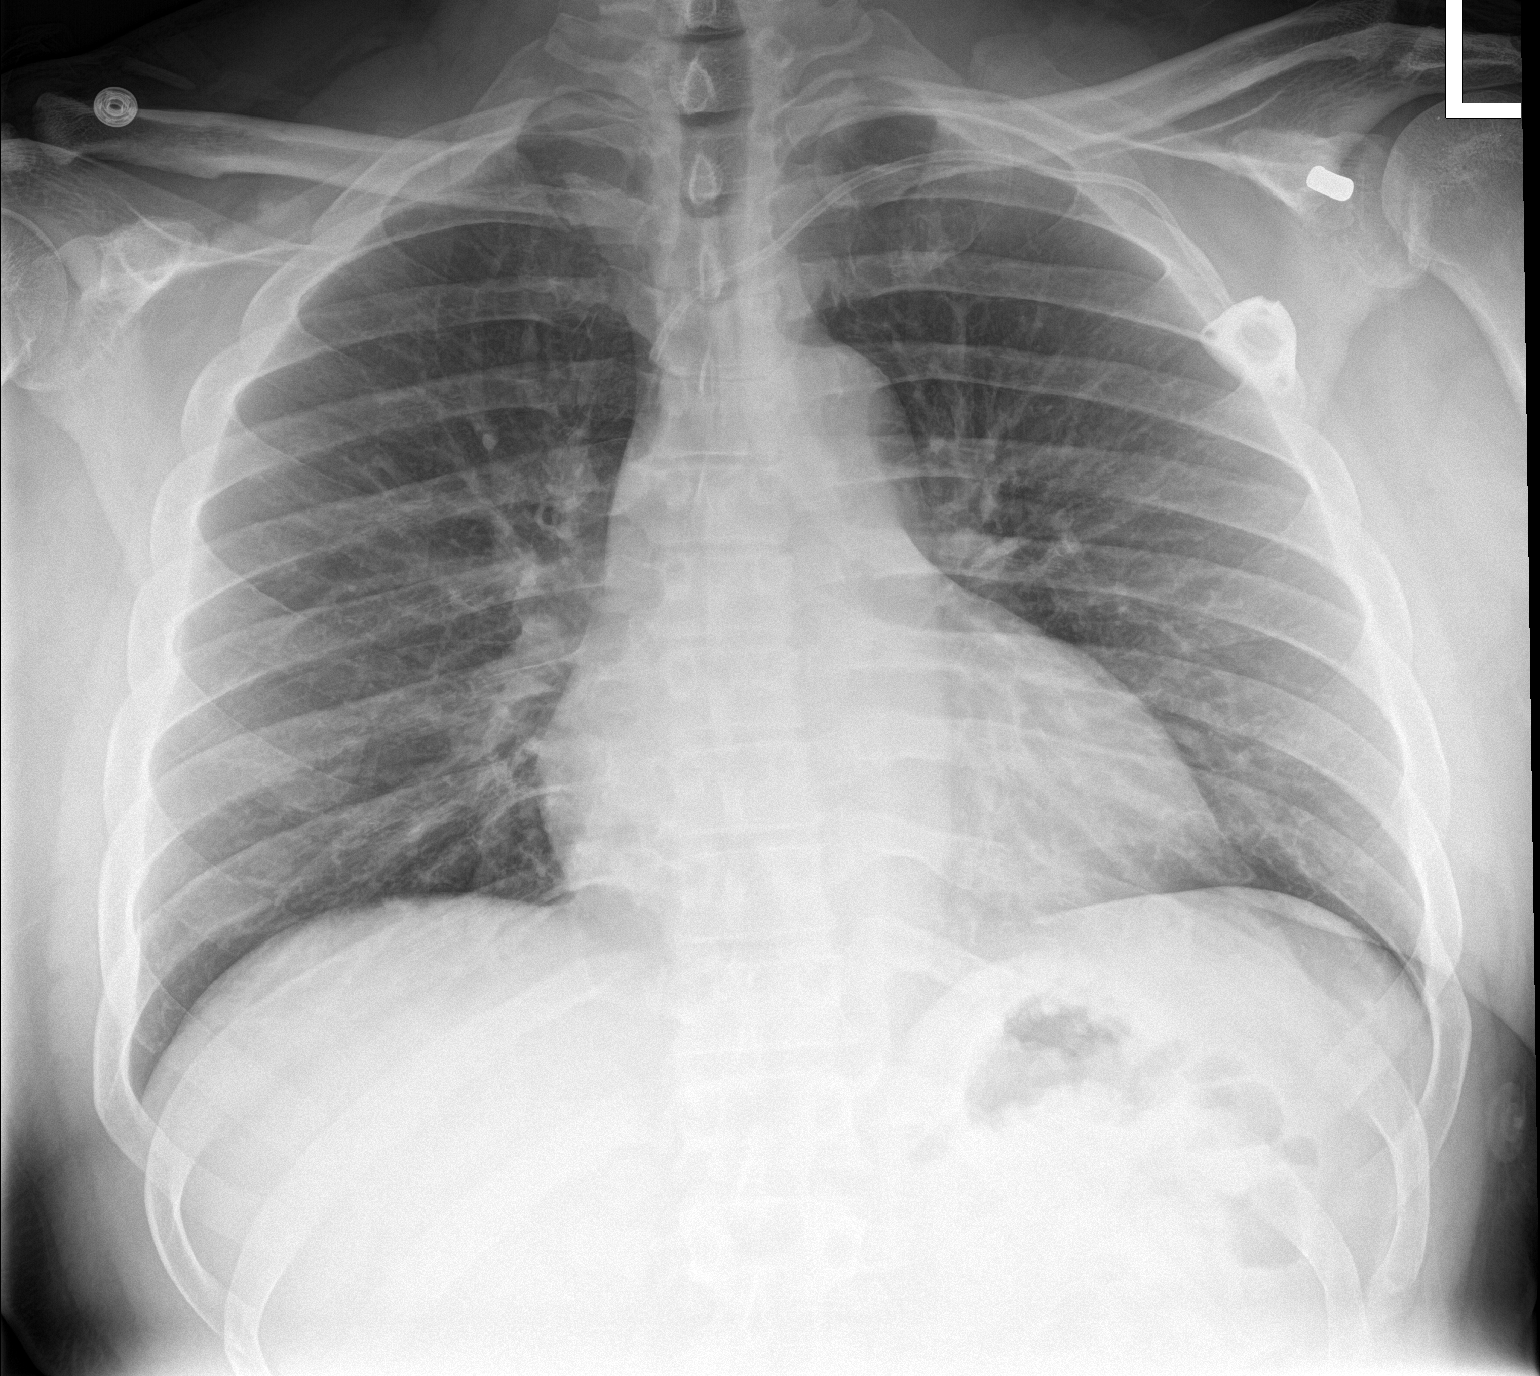

[chest lat]
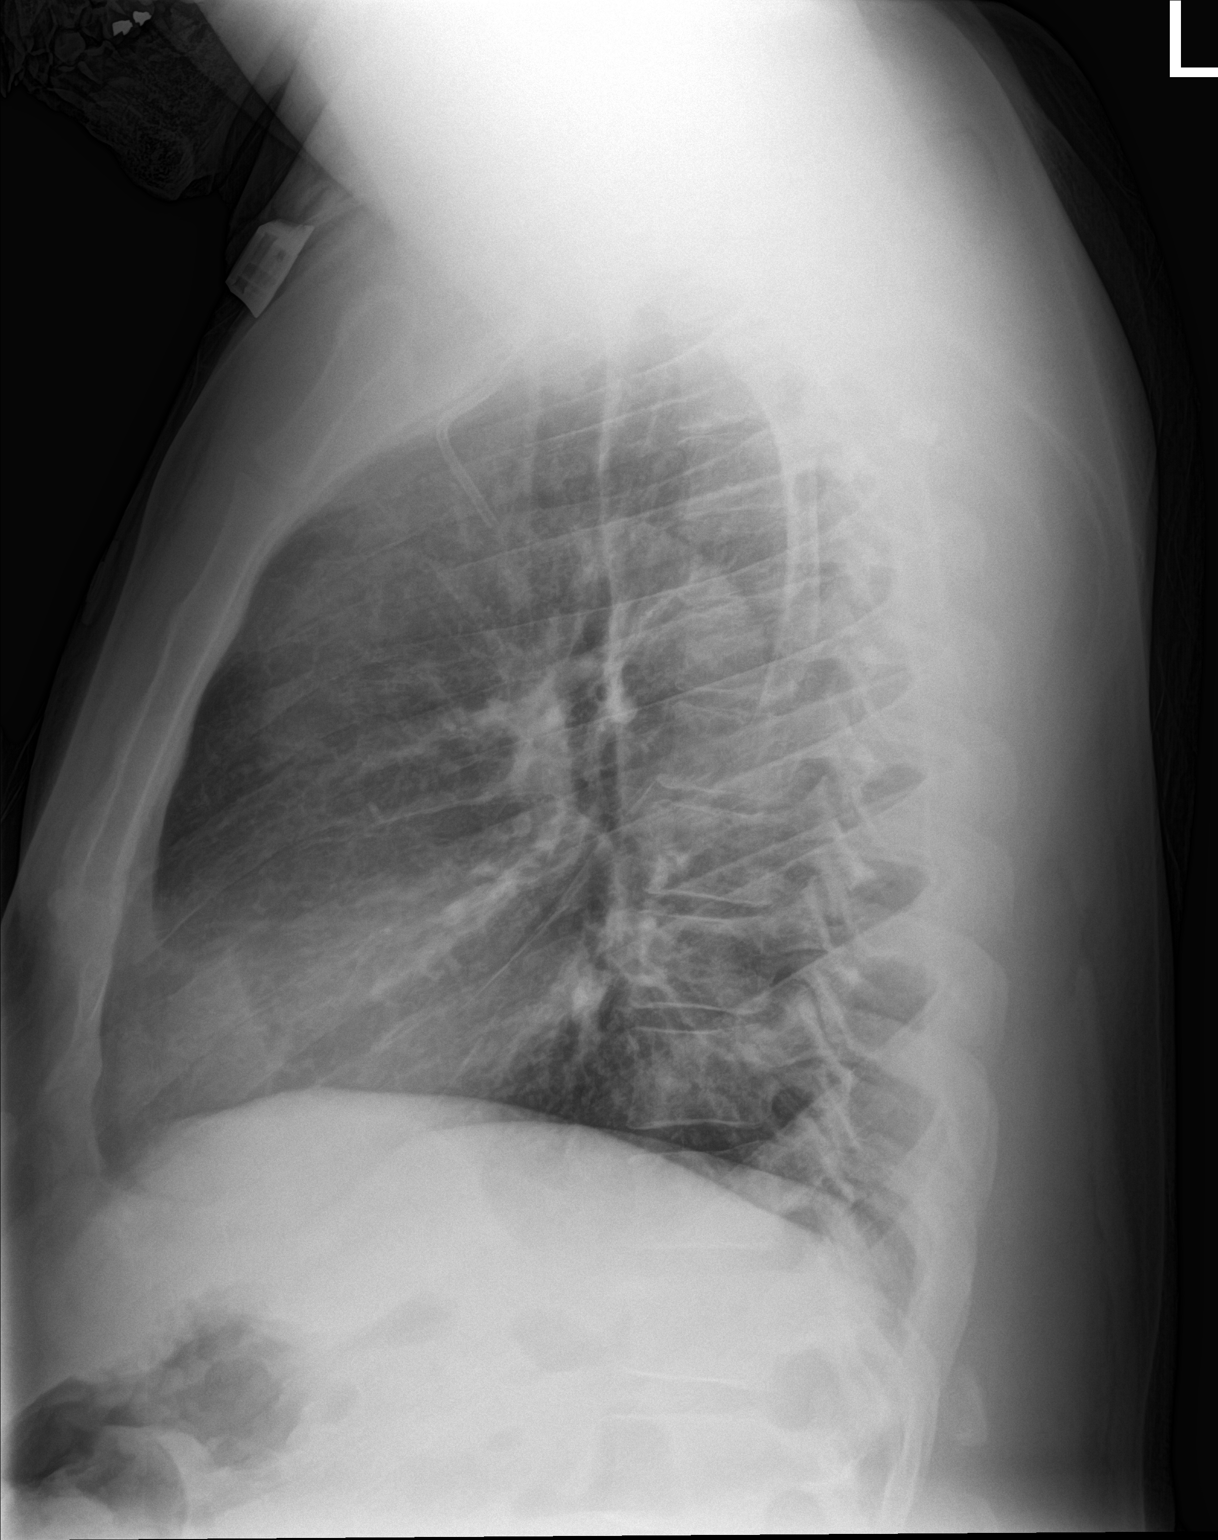

[2 of 2 positions shown; findings below may reference images not displayed]

FINDINGS: The lungs are well-aerated. Mild vascular congestion is noted. There
is no evidence of focal opacification, pleural effusion or
pneumothorax.

The heart is normal in size; the mediastinal contour is within
normal limits. No acute osseous abnormalities are seen. A left-sided
chest port is noted ending about the proximal SVC.
IMPRESSION: Mild vascular congestion noted.  Lungs remain grossly clear.

## 2017-03-28 IMAGING — CR DG CHEST 2V
2 series · 2 of 2 positions shown · non-contrast
Comparison: Chest radiograph and CTA of the chest performed
10/07/2015

CLINICAL DATA: Acute onset of generalized chest pain and abdominal
pain. Shortness of breath. Initial encounter.

EXAM:
CHEST  2 VIEW

[chest pa]
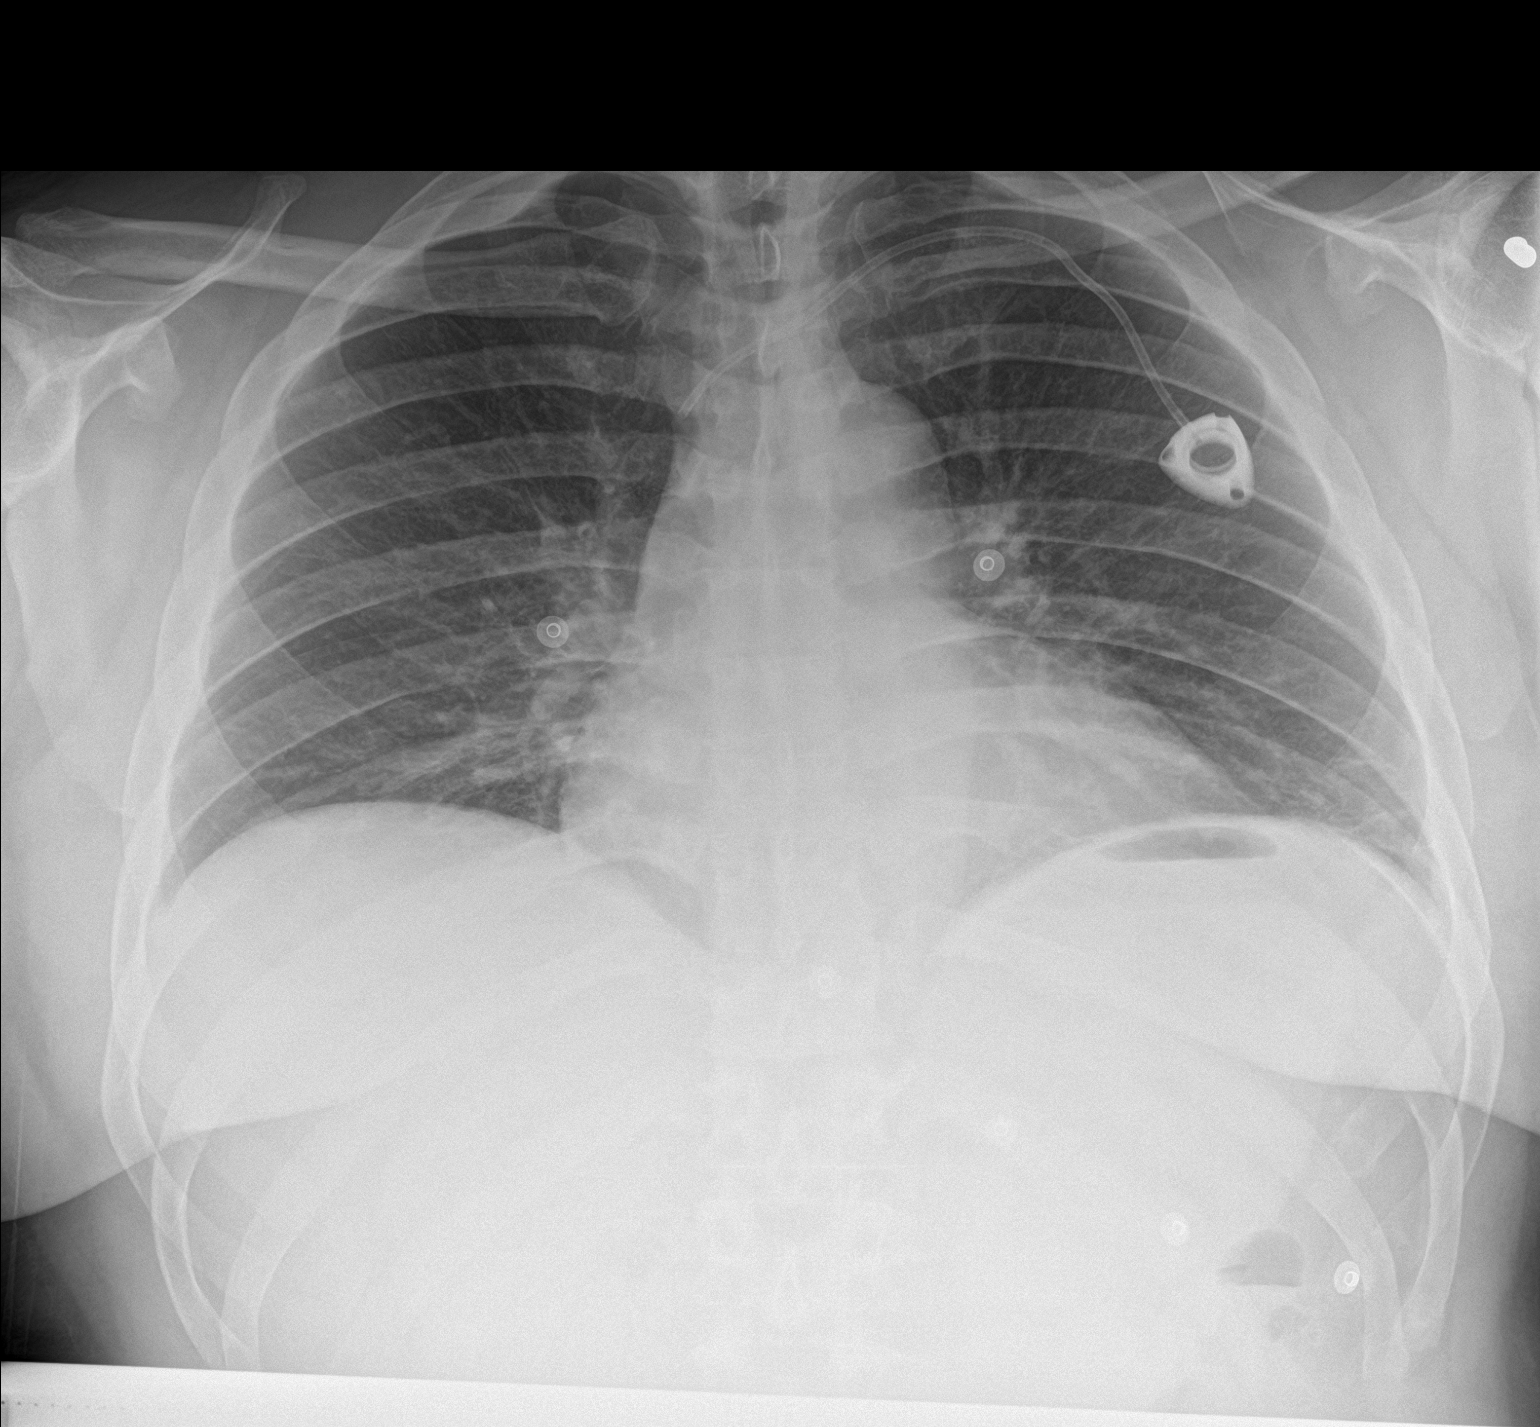

[chest lat]
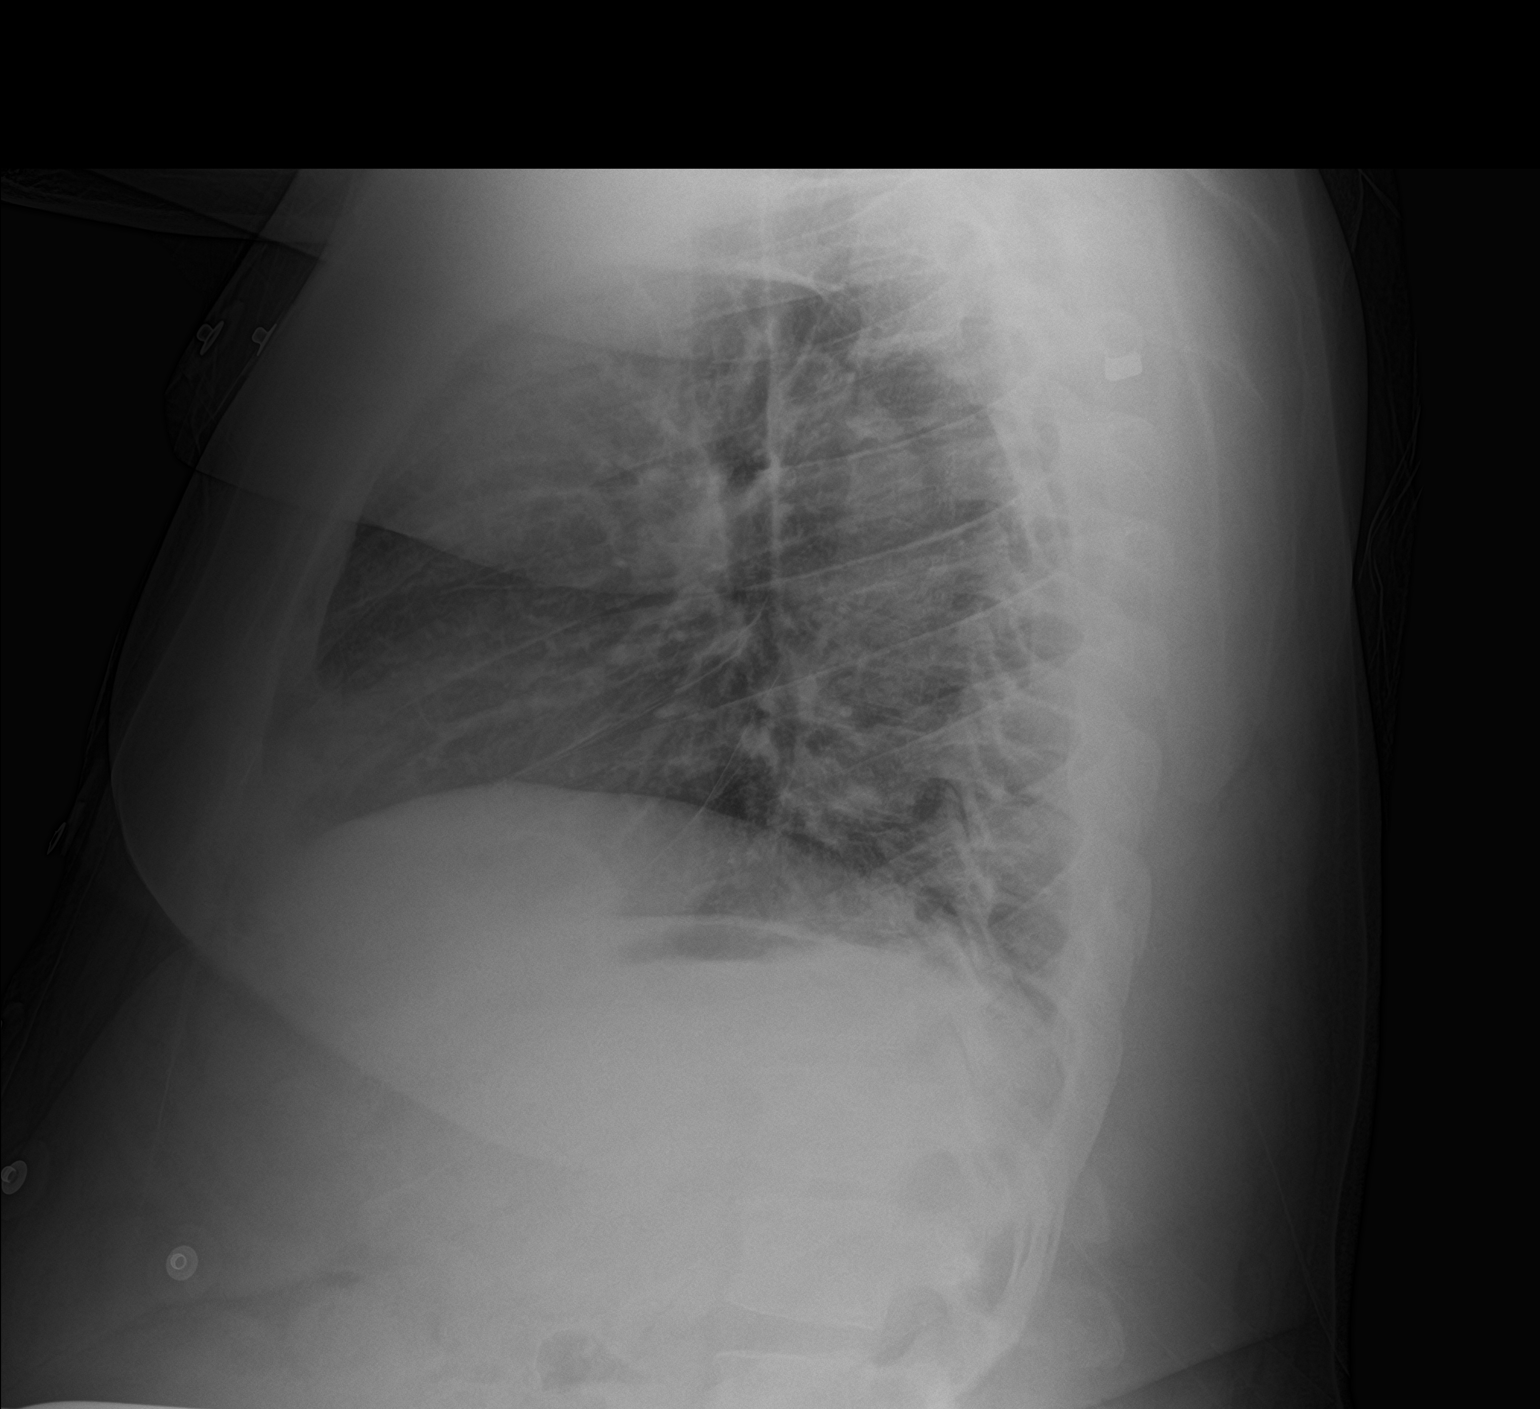

[2 of 2 positions shown; findings below may reference images not displayed]

FINDINGS: The lungs are well-aerated. Minimal bibasilar atelectasis is noted.
There is no evidence of pleural effusion or pneumothorax.

The heart is normal in size; the mediastinal contour is within
normal limits. A left-sided chest port is noted at the proximal SVC.
No acute osseous abnormalities are seen.
IMPRESSION: Minimal bibasilar atelectasis noted.  Lungs otherwise clear.

## 2017-09-14 DEATH — deceased
